# Patient Record
Sex: Female | Born: 1970 | Race: White | Hispanic: No | Marital: Married | Smoking: Never smoker
Health system: Southern US, Community
[De-identification: ages and names within clinical notes are randomized; demographics above are authoritative.]

## PROBLEM LIST (undated history)

## (undated) ENCOUNTER — Emergency Department (HOSPITAL_BASED_OUTPATIENT_CLINIC_OR_DEPARTMENT_OTHER): Admission: EM | Payer: No Typology Code available for payment source

## (undated) DIAGNOSIS — G8929 Other chronic pain: Secondary | ICD-10-CM

## (undated) DIAGNOSIS — Q909 Down syndrome, unspecified: Secondary | ICD-10-CM

## (undated) DIAGNOSIS — K219 Gastro-esophageal reflux disease without esophagitis: Secondary | ICD-10-CM

## (undated) DIAGNOSIS — F419 Anxiety disorder, unspecified: Secondary | ICD-10-CM

## (undated) DIAGNOSIS — K648 Other hemorrhoids: Secondary | ICD-10-CM

## (undated) DIAGNOSIS — R51 Headache: Secondary | ICD-10-CM

## (undated) DIAGNOSIS — G4730 Sleep apnea, unspecified: Secondary | ICD-10-CM

## (undated) DIAGNOSIS — R32 Unspecified urinary incontinence: Secondary | ICD-10-CM

## (undated) DIAGNOSIS — Z8249 Family history of ischemic heart disease and other diseases of the circulatory system: Secondary | ICD-10-CM

## (undated) DIAGNOSIS — M549 Dorsalgia, unspecified: Secondary | ICD-10-CM

## (undated) DIAGNOSIS — R112 Nausea with vomiting, unspecified: Secondary | ICD-10-CM

## (undated) DIAGNOSIS — R896 Abnormal cytological findings in specimens from other organs, systems and tissues: Secondary | ICD-10-CM

## (undated) DIAGNOSIS — Z9889 Other specified postprocedural states: Secondary | ICD-10-CM

## (undated) DIAGNOSIS — F40240 Claustrophobia: Secondary | ICD-10-CM

## (undated) DIAGNOSIS — K209 Esophagitis, unspecified without bleeding: Secondary | ICD-10-CM

## (undated) DIAGNOSIS — R519 Headache, unspecified: Secondary | ICD-10-CM

## (undated) DIAGNOSIS — Z8489 Family history of other specified conditions: Secondary | ICD-10-CM

## (undated) HISTORY — DX: Down syndrome, unspecified: Q90.9

## (undated) HISTORY — PX: TENDON REPAIR: SHX5111

## (undated) HISTORY — DX: Unspecified urinary incontinence: R32

## (undated) HISTORY — DX: Sleep apnea, unspecified: G47.30

## (undated) HISTORY — DX: Anxiety disorder, unspecified: F41.9

## (undated) HISTORY — DX: Abnormal cytological findings in specimens from other organs, systems and tissues: R89.6

## (undated) HISTORY — PX: GALLBLADDER SURGERY: SHX652

## (undated) HISTORY — DX: Other hemorrhoids: K64.8

---
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
## 2001-11-03 ENCOUNTER — Ambulatory Visit (HOSPITAL_COMMUNITY)
Admission: RE | Admit: 2001-11-03 | Discharge: 2001-11-03 | Payer: Self-pay | Admitting: Physical Medicine & Rehabilitation

## 2001-11-03 ENCOUNTER — Encounter: Payer: Self-pay | Admitting: Obstetrics and Gynecology

## 2002-01-24 ENCOUNTER — Encounter: Payer: Self-pay | Admitting: Gastroenterology

## 2002-01-24 ENCOUNTER — Encounter: Admission: RE | Admit: 2002-01-24 | Discharge: 2002-01-24 | Payer: Self-pay | Admitting: Gastroenterology

## 2002-04-11 ENCOUNTER — Ambulatory Visit (HOSPITAL_COMMUNITY): Admission: RE | Admit: 2002-04-11 | Discharge: 2002-04-11 | Payer: Self-pay | Admitting: Gastroenterology

## 2003-07-01 ENCOUNTER — Other Ambulatory Visit: Admission: RE | Admit: 2003-07-01 | Discharge: 2003-07-01 | Payer: Self-pay | Admitting: *Deleted

## 2004-07-07 ENCOUNTER — Other Ambulatory Visit: Admission: RE | Admit: 2004-07-07 | Discharge: 2004-07-07 | Payer: Self-pay | Admitting: *Deleted

## 2005-08-30 DIAGNOSIS — IMO0001 Reserved for inherently not codable concepts without codable children: Secondary | ICD-10-CM

## 2005-08-30 HISTORY — DX: Reserved for inherently not codable concepts without codable children: IMO0001

## 2005-10-06 ENCOUNTER — Other Ambulatory Visit: Admission: RE | Admit: 2005-10-06 | Discharge: 2005-10-06 | Payer: Self-pay | Admitting: *Deleted

## 2006-04-19 ENCOUNTER — Other Ambulatory Visit: Admission: RE | Admit: 2006-04-19 | Discharge: 2006-04-19 | Payer: Self-pay | Admitting: Gynecology

## 2006-11-08 ENCOUNTER — Inpatient Hospital Stay (HOSPITAL_COMMUNITY): Admission: RE | Admit: 2006-11-08 | Discharge: 2006-11-10 | Payer: Self-pay | Admitting: Gynecology

## 2006-12-06 ENCOUNTER — Other Ambulatory Visit: Admission: RE | Admit: 2006-12-06 | Discharge: 2006-12-06 | Payer: Self-pay | Admitting: Gynecology

## 2007-06-27 ENCOUNTER — Other Ambulatory Visit: Admission: RE | Admit: 2007-06-27 | Discharge: 2007-06-27 | Payer: Self-pay | Admitting: Gynecology

## 2008-11-22 DIAGNOSIS — J449 Chronic obstructive pulmonary disease, unspecified: Secondary | ICD-10-CM | POA: Insufficient documentation

## 2008-11-25 ENCOUNTER — Ambulatory Visit: Payer: Self-pay | Admitting: Pulmonary Disease

## 2008-11-25 DIAGNOSIS — R05 Cough: Secondary | ICD-10-CM

## 2008-11-25 DIAGNOSIS — R059 Cough, unspecified: Secondary | ICD-10-CM | POA: Insufficient documentation

## 2008-12-06 ENCOUNTER — Other Ambulatory Visit: Admission: RE | Admit: 2008-12-06 | Discharge: 2008-12-06 | Payer: Self-pay | Admitting: Gynecology

## 2008-12-06 ENCOUNTER — Encounter: Payer: Self-pay | Admitting: Gynecology

## 2008-12-06 ENCOUNTER — Ambulatory Visit: Payer: Self-pay | Admitting: Gynecology

## 2009-04-08 ENCOUNTER — Ambulatory Visit: Payer: Self-pay | Admitting: Gynecology

## 2009-04-18 ENCOUNTER — Ambulatory Visit: Payer: Self-pay | Admitting: Gynecology

## 2009-11-17 ENCOUNTER — Inpatient Hospital Stay (HOSPITAL_COMMUNITY)
Admission: AD | Admit: 2009-11-17 | Discharge: 2009-11-19 | Payer: Self-pay | Source: Ambulatory Visit | Admitting: Obstetrics and Gynecology

## 2009-11-17 ENCOUNTER — Encounter (INDEPENDENT_AMBULATORY_CARE_PROVIDER_SITE_OTHER): Payer: Self-pay | Admitting: Obstetrics and Gynecology

## 2010-02-23 ENCOUNTER — Ambulatory Visit: Payer: Self-pay | Admitting: Gynecology

## 2010-02-23 ENCOUNTER — Other Ambulatory Visit: Admission: RE | Admit: 2010-02-23 | Discharge: 2010-02-23 | Payer: Self-pay | Admitting: Gynecology

## 2010-07-08 ENCOUNTER — Ambulatory Visit: Payer: Self-pay | Admitting: Gynecology

## 2010-11-10 ENCOUNTER — Ambulatory Visit (INDEPENDENT_AMBULATORY_CARE_PROVIDER_SITE_OTHER): Payer: BC Managed Care – PPO | Admitting: Gynecology

## 2010-11-10 DIAGNOSIS — Z30431 Encounter for routine checking of intrauterine contraceptive device: Secondary | ICD-10-CM

## 2010-11-22 LAB — CBC
HCT: 32.8 % — ABNORMAL LOW (ref 36.0–46.0)
HCT: 40.5 % (ref 36.0–46.0)
Hemoglobin: 11.1 g/dL — ABNORMAL LOW (ref 12.0–15.0)
Hemoglobin: 13.4 g/dL (ref 12.0–15.0)
MCHC: 33.1 g/dL (ref 30.0–36.0)
MCHC: 33.9 g/dL (ref 30.0–36.0)
MCV: 85.6 fL (ref 78.0–100.0)
MCV: 85.9 fL (ref 78.0–100.0)
Platelets: 170 10*3/uL (ref 150–400)
Platelets: 176 10*3/uL (ref 150–400)
RBC: 3.83 MIL/uL — ABNORMAL LOW (ref 3.87–5.11)
RBC: 4.72 MIL/uL (ref 3.87–5.11)
RDW: 14.6 % (ref 11.5–15.5)
RDW: 14.7 % (ref 11.5–15.5)
WBC: 12.9 10*3/uL — ABNORMAL HIGH (ref 4.0–10.5)
WBC: 14.4 10*3/uL — ABNORMAL HIGH (ref 4.0–10.5)

## 2010-11-22 LAB — RPR: RPR Ser Ql: NONREACTIVE

## 2011-01-15 NOTE — H&P (Signed)
NAME:  Norma [name], Norma [name]         ACCOUNT NO.:  [id number]   MEDICAL RECORD NO.:  [id number]          PATIENT TYPE:  INP   LOCATION:                                FACILITY:  [facility]   PHYSICIAN:  Timothy P. Fontaine, M.D.DATE OF BIRTH:  [birth year]   DATE OF ADMISSION:  11/08/2006  DATE OF DISCHARGE:                              HISTORY & PHYSICAL   DATE OF ADMISSION:  She is being admitted to [facility] November 08, 2006, to labor and delivery.   CHIEF COMPLAINT:  1. Pregnancy at term.  2. Favorable cervix.   HISTORY OF PRESENT ILLNESS:  A 40 year old G1, P0 female at [redacted] weeks  gestation with favorable cervix admitted for induction.  Prenatal course  has been uncomplicated with the exception of an ASCUS Pap smear at new  OB with planned postpartum followup.  She is beta strep negative  carrier.  She had normal first trimester serum screening and nuchal  thickness.  For the remainder of her history, see her [facility].   PHYSICAL EXAMINATION:  HEENT:  Normal.  LUNGS:  Clear.  CARDIAC:  Regular rate without rubs, murmurs or gallops.  ABDOMEN:  Gravid, vertex fetus consistent with term.  Positive Doptones.  PELVIC:  Cervix 50%, 2 cm, -2 station to -1 station, vertex  presentation.   ASSESSMENT AND PLAN:  A 41 year old gravida 1, para 0 female, term  gestation, favorable cervix, who is admitted for induction.  Options for  continued expectant management and antepartum testing versus induction  were reviewed and the patient elects for induction.  The patient is beta  strep negative.      Timothy P. Fontaine, M.D.  Electronically Signed     TPF/MEDQ  D:  11/07/2006  T:  11/07/2006  Job:  [id number]

## 2011-01-15 NOTE — Op Note (Signed)
   NAME:  Norma [name], Norma [name]                            ACCOUNT NO.:  [id number]   MEDICAL RECORD NO.:  [id number]                   PATIENT TYPE:  AMB   LOCATION:  ENDO                                 FACILITY:  [facility]   PHYSICIAN:  Charna Elizabeth, M.D.                   DATE OF BIRTH:  1971-05-17   DATE OF PROCEDURE:  04/11/2002  DATE OF DISCHARGE:                                 OPERATIVE REPORT   PROCEDURE:  Flexible sigmoidoscopy up to the hepatic flexure.   ENDOSCOPIST:  Charna Elizabeth, M.D.   INSTRUMENT USED:  Olympus video colonoscope.   INDICATIONS:  Severe abdominal pain with occasional dark stools and  worsening constipation in a 40 year old white female.  Rule out colonic  polyps, masses, etc.  The patient has a long-standing history of IBS.   PREPROCEDURE PREPARATION:  Informed consent was procured from the patient.  The patient was fasted for eight hours prior to the procedure and prepped  with a bottle of Fleet's Phospho-Soda the night prior to the procedure.   PREPROCEDURE PHYSICAL:  VITAL SIGNS:  The patient had stable vital signs.  NECK:  Supple.  CHEST:  Clear to auscultation.  S1, S2 regular.  ABDOMEN:  Soft with normal bowel sounds.   DESCRIPTION OF PROCEDURE:  The patient was placed in the left lateral  decubitus position and sedated with 80 mg of Demerol and 8 mg of Versed  intravenously.  Once the patient was adequately sedate and maintained on low-  flow oxygen and continuous cardiac monitoring, the Olympus video colonoscope  was advanced from the rectum to the hepatic flexure and the distal right  colon without difficulty.  No masses, polyps, erosions, ulceration, or  diverticula were seen.   IMPRESSION:  Normal exam up to the hepatic flexure.   RECOMMENDATIONS:  1. A high-fiber diet has been recommended for the patient with liberal fluid     intake.  2. Outpatient follow-up in the next two weeks for further recommendations.                 Charna Elizabeth, M.D.    JM/MEDQ  D:  04/11/2002  T:  04/13/2002  Job:  [id number]   cc:   Lafonda Mosses B. Thomasena Edis, M.D.

## 2011-11-12 ENCOUNTER — Other Ambulatory Visit: Payer: Self-pay | Admitting: Gynecology

## 2011-11-12 NOTE — Telephone Encounter (Signed)
Left a message for the patient. We have not seen her since early 2012 and no AEX since summer of 2011.

## 2011-11-18 ENCOUNTER — Encounter: Payer: Self-pay | Admitting: *Deleted

## 2011-11-18 ENCOUNTER — Encounter: Payer: Self-pay | Admitting: Gynecology

## 2011-11-18 ENCOUNTER — Other Ambulatory Visit (HOSPITAL_COMMUNITY)
Admission: RE | Admit: 2011-11-18 | Discharge: 2011-11-18 | Disposition: A | Discharge status: Home / Self Care | Payer: BC Managed Care – PPO | Source: Ambulatory Visit | Attending: Gynecology | Admitting: Gynecology

## 2011-11-18 ENCOUNTER — Ambulatory Visit (INDEPENDENT_AMBULATORY_CARE_PROVIDER_SITE_OTHER): Payer: BC Managed Care – PPO | Admitting: Gynecology

## 2011-11-18 VITALS — BP 120/80 | Ht 68.0 in | Wt 243.0 lb

## 2011-11-18 DIAGNOSIS — IMO0001 Reserved for inherently not codable concepts without codable children: Secondary | ICD-10-CM | POA: Insufficient documentation

## 2011-11-18 DIAGNOSIS — Z01419 Encounter for gynecological examination (general) (routine) without abnormal findings: Secondary | ICD-10-CM | POA: Insufficient documentation

## 2011-11-18 DIAGNOSIS — Z30431 Encounter for routine checking of intrauterine contraceptive device: Secondary | ICD-10-CM

## 2011-11-18 MED ORDER — FLUOXETINE HCL 20 MG PO TABS: 20.0000 mg | ORAL_TABLET | Freq: Every day | ORAL | Status: DC

## 2011-11-18 NOTE — Progress Notes (Signed)
Norma [name] 1971-07-10 [id number]        41 y.o.  for annual exam.  Doing well Mirena IUD with scant menses.  Past medical history,surgical history, medications, allergies, family history and social history were all reviewed and documented in the EPIC chart. ROS:  Was performed and pertinent positives and negatives are included in the history.  Exam: Sherrilyn Rist chaperone present Filed Vitals:   11/18/11 1401  BP: 120/80   General appearance  Normal Skin grossly normal Head/Neck normal with no cervical or supraclavicular adenopathy thyroid normal Lungs  clear Cardiac RR, without RMG Abdominal  soft, nontender, without masses, organomegaly or hernia Breasts  examined lying and sitting without masses, retractions, discharge or axillary adenopathy. Pelvic  Ext/BUS/vagina  normal   Cervix  normal  Pap done, IUD string visualized with colposcope.  Uterus  anteverted, normal size, shape and contour, midline and mobile nontender   Adnexa  Without masses or tenderness    Anus and perineum  normal   Rectovaginal  normal sphincter tone without palpated masses or tenderness.    Assessment/Plan:  41 y.o. female for annual exam.    1. IUD management. Mirena IUD placed 06/2010. She's doing well a scant absent menses. Her IUD string was visualized at the external os with the colposcope. 2. Pap smear. Pap smear was done today she has not had one in 2 years. 3. Mammography. I reviewed screening mammogram recommendations P. 35 and 40. She has no strong family history prefers to wait closer to 40. SBE monthly reviewed. 4. Health maintenance. Patient is currently being evaluated for some arm weakness and restless legs by her primary. She reports having a number of blood tests done. I asked her make sure that there is a thyroid panel run and she agrees to check on this. She seeing a weight loss specialist next week will follow up with them. No blood work was done today as she reports having glucose lipid  profile and other bloods done through her primary. Assuming she continues well from a gynecologic standpoint then she will see me in a year, sooner as needed.    [name], 2:40 PM 11/18/2011

## 2011-11-18 NOTE — Patient Instructions (Signed)
Follow up in one year for your annual gynecologic exam. 

## 2012-02-04 ENCOUNTER — Telehealth: Payer: Self-pay | Admitting: *Deleted

## 2012-02-04 MED ORDER — ALPRAZOLAM 0.5 MG PO TABS: 0.5000 mg | ORAL_TABLET | Freq: Every evening | ORAL | Status: AC | PRN

## 2012-02-04 NOTE — Telephone Encounter (Signed)
Xanax 0.5 mg #30 one by mouth every 6 hours when necessary anxiety 2 refill okay.

## 2012-02-04 NOTE — Telephone Encounter (Signed)
Pt called requesting new rx for xanax 0.5 mg tablets, she forgot to ask for refill at annual in march. Please advise

## 2012-02-04 NOTE — Telephone Encounter (Signed)
Pt informed, rx called in 

## 2012-11-21 ENCOUNTER — Other Ambulatory Visit: Payer: Self-pay | Admitting: Gynecology

## 2012-11-21 NOTE — Telephone Encounter (Signed)
I left message and asked Cecil Cranker. To try to call patient to set up CE.

## 2012-12-07 ENCOUNTER — Encounter: Payer: Self-pay | Admitting: Gynecology

## 2012-12-07 ENCOUNTER — Ambulatory Visit (INDEPENDENT_AMBULATORY_CARE_PROVIDER_SITE_OTHER): Payer: BC Managed Care – PPO | Admitting: Gynecology

## 2012-12-07 VITALS — BP 120/80 | Ht 68.0 in | Wt 244.0 lb

## 2012-12-07 DIAGNOSIS — N949 Unspecified condition associated with female genital organs and menstrual cycle: Secondary | ICD-10-CM

## 2012-12-07 DIAGNOSIS — R102 Pelvic and perineal pain: Secondary | ICD-10-CM

## 2012-12-07 DIAGNOSIS — Z01419 Encounter for gynecological examination (general) (routine) without abnormal findings: Secondary | ICD-10-CM

## 2012-12-07 NOTE — Progress Notes (Signed)
Norma [name] [birth year] [id number]        42 y.o.  G2P2 for annual exam.  Doing well does note some pelvic cramping as noted below.  Past medical history,surgical history, medications, allergies, family history and social history were all reviewed and documented in the EPIC chart. ROS:  Was performed and pertinent positives and negatives are included in the history.  Exam: [name] assistant Filed Vitals:   12/07/12 1053  BP: 120/80  Height: 5\' 8"  (1.727 m)  Weight: 244 lb (110.678 kg)   General appearance  Normal Skin grossly normal Head/Neck normal with no cervical or supraclavicular adenopathy thyroid normal Lungs  clear Cardiac RR, without RMG Abdominal  soft, nontender, without masses, organomegaly or hernia Breasts  examined lying and sitting without masses, retractions, discharge or axillary adenopathy. Pelvic  Ext/BUS/vagina  normal   Cervix  normal IUD string palpated at the external os  Uterus  anteverted, normal size, shape and contour, midline and mobile nontender   Adnexa  Without masses or tenderness    Anus and perineum  normal   Rectovaginal  normal sphincter tone without palpated masses or tenderness.    Assessment/Plan:  42 y.o. G2P2 female for annual exam.   1. Mirena IUD 06/2010. She is having pelvic cramping like menstrual cramping.  No menses with occasional spotting. Will check baseline ultrasound for placement and other pelvic pathology. Patient will schedule follow up for this. Assuming negative then we'll follow as is not overly bothersome to her. 2. Pap smear 2013. No Pap smear done today. History of ascus with negative HPV 2007. Other Pap smears normal. Plan screening every 3 years. 3. Breast health. SBE monthly review. We'll plan baseline mammogram at age 42. 4. Health maintenance. Patient has appointment to see a new primary physician and will follow up with them for routine exam and lab work. Follow up for ultrasound otherwise one year.      [name], 11:16 AM 12/07/2012

## 2012-12-07 NOTE — Patient Instructions (Signed)
Follow up for ultrasound as scheduled 

## 2012-12-13 ENCOUNTER — Ambulatory Visit: Payer: BC Managed Care – PPO | Admitting: Gynecology

## 2012-12-13 ENCOUNTER — Other Ambulatory Visit: Payer: BC Managed Care – PPO

## 2012-12-29 ENCOUNTER — Encounter: Payer: Self-pay | Admitting: Gynecology

## 2012-12-29 ENCOUNTER — Ambulatory Visit (INDEPENDENT_AMBULATORY_CARE_PROVIDER_SITE_OTHER): Payer: BC Managed Care – PPO

## 2012-12-29 ENCOUNTER — Ambulatory Visit (INDEPENDENT_AMBULATORY_CARE_PROVIDER_SITE_OTHER): Payer: BC Managed Care – PPO | Admitting: Gynecology

## 2012-12-29 DIAGNOSIS — R102 Pelvic and perineal pain: Secondary | ICD-10-CM

## 2012-12-29 DIAGNOSIS — N949 Unspecified condition associated with female genital organs and menstrual cycle: Secondary | ICD-10-CM

## 2012-12-29 NOTE — Patient Instructions (Signed)
Followup for annual exam in one year, sooner as needed. 

## 2012-12-29 NOTE — Progress Notes (Signed)
Patient presents for ultrasound due to complains of pelvic cramping with Mirena IUD. Has occasional menses and spotting.  Ultrasound shows normal uterine size and echotexture. Endometrial echo 3.9 mm. Right and left ovaries visualized and normal. IUD is visualized in the endometrial cavity in the appropriate area. Cul-de-sac negative  Assessment and plan: Pelvic cramping with IUD. Normal exam and ultrasound. Options of management to include removal versus observation discussed. Patient feels that the discomfort is not significant enough to remove the IUD and prefers to monitor. She'll followup in one year for her annual exam, sooner as needed.

## 2012-12-31 LAB — HM PAP SMEAR: HM PAP: NORMAL

## 2013-01-01 ENCOUNTER — Other Ambulatory Visit: Payer: Self-pay | Admitting: Gynecology

## 2013-01-03 ENCOUNTER — Encounter: Payer: Self-pay | Admitting: Family Medicine

## 2013-01-03 ENCOUNTER — Ambulatory Visit (INDEPENDENT_AMBULATORY_CARE_PROVIDER_SITE_OTHER): Payer: BC Managed Care – PPO | Admitting: Family Medicine

## 2013-01-03 VITALS — BP 118/80 | HR 93 | Temp 98.2°F | Ht 67.5 in | Wt 250.4 lb

## 2013-01-03 DIAGNOSIS — F418 Other specified anxiety disorders: Secondary | ICD-10-CM | POA: Insufficient documentation

## 2013-01-03 DIAGNOSIS — M546 Pain in thoracic spine: Secondary | ICD-10-CM

## 2013-01-03 DIAGNOSIS — Z1331 Encounter for screening for depression: Secondary | ICD-10-CM

## 2013-01-03 DIAGNOSIS — F341 Dysthymic disorder: Secondary | ICD-10-CM

## 2013-01-03 MED ORDER — MELOXICAM 15 MG PO TABS: 15.0000 mg | ORAL_TABLET | Freq: Every day | ORAL | Status: DC

## 2013-01-03 MED ORDER — CYCLOBENZAPRINE HCL 10 MG PO TABS: 10.0000 mg | ORAL_TABLET | Freq: Three times a day (TID) | ORAL | Status: DC | PRN

## 2013-01-03 MED ORDER — ESCITALOPRAM OXALATE 10 MG PO TABS: 10.0000 mg | ORAL_TABLET | Freq: Every day | ORAL | Status: DC

## 2013-01-03 NOTE — Patient Instructions (Addendum)
Follow up in 1 month to recheck mood and back Stop the Prozac START the Lexapro Start the Mobic daily for inflammation Use the flexeril at night for spasm- start w/ 1/2 tab b/c this will make you drowsy HEAT! [facility] can help! Call with any questions or concerns Welcome!  We're glad to have you!

## 2013-01-03 NOTE — Assessment & Plan Note (Signed)
New.  Likely muscle strain and spasm due to carrying daughter regularly.  Start daily NSAIDs, flexeril and heat prn.  Reviewed supportive care and red flags that should prompt return.  Pt expressed understanding and is in agreement w/ plan.

## 2013-01-03 NOTE — Assessment & Plan Note (Signed)
New to provider, ongoing for pt.  Extremely stressed w/ demands of special needs daughter.  Prozac has made pt 'flat' but she remains anxious.  sxs not well controlled.  Will stop Prozac and switch to Lexapro.  Encouraged counseling.  Total time spent w/ pt, 30+ min, >50% spent counseling

## 2013-01-03 NOTE — Progress Notes (Signed)
  Subjective:    Patient ID: Norma [name], female    DOB: 08-Aug-1971, 42 y.o.   MRN: [id number]  HPI New to establish.  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  Has 51 yr old and 40 yr old w/ Jeral Pinch and multiple medical issues.  Pt is feeling that the Prozac is making her 'flat'.  Pt reports feeling very anxious.  Back pain- pt is having both upper back pain due to trap spasm and 'this is where i keep my stress'.  Having mid back pain due to lifting special needs daughter (3 yrs old and not walking).  Taking large quantities of ibuprofen- starting to be ineffective.  Pt reports ~40 lb weight gain recently.   Review of Systems For ROS see HPI     Objective:   Physical Exam  Vitals reviewed. Constitutional: She is oriented to person, place, and time. She appears well-developed and well-nourished. No distress.  Musculoskeletal: She exhibits tenderness (TTP over traps bilaterally and across lats bilateraly, L>R). She exhibits no edema.  Good forward flexion and extension of back (-) SLR bilaterally No bony TTP  Neurological: She is alert and oriented to person, place, and time. She has normal reflexes. No cranial nerve deficit. Coordination normal.  Skin: Skin is warm and dry.  Psychiatric: She has a normal mood and affect. Her behavior is normal. Judgment and thought content normal.          Assessment & Plan:

## 2013-02-01 ENCOUNTER — Other Ambulatory Visit: Payer: Self-pay | Admitting: Family Medicine

## 2013-02-02 NOTE — Telephone Encounter (Signed)
Med filled.  

## 2013-02-12 ENCOUNTER — Ambulatory Visit: Payer: BC Managed Care – PPO | Admitting: Family Medicine

## 2013-02-14 ENCOUNTER — Encounter: Payer: Self-pay | Admitting: Family Medicine

## 2013-02-14 ENCOUNTER — Ambulatory Visit (INDEPENDENT_AMBULATORY_CARE_PROVIDER_SITE_OTHER): Payer: BC Managed Care – PPO | Admitting: Family Medicine

## 2013-02-14 VITALS — BP 110/80 | HR 96 | Temp 98.2°F | Ht 67.5 in | Wt 255.6 lb

## 2013-02-14 DIAGNOSIS — F418 Other specified anxiety disorders: Secondary | ICD-10-CM

## 2013-02-14 DIAGNOSIS — F341 Dysthymic disorder: Secondary | ICD-10-CM

## 2013-02-14 MED ORDER — CYCLOBENZAPRINE HCL 10 MG PO TABS: 10.0000 mg | ORAL_TABLET | Freq: Three times a day (TID) | ORAL | Status: DC | PRN

## 2013-02-14 MED ORDER — ESCITALOPRAM OXALATE 20 MG PO TABS: 20.0000 mg | ORAL_TABLET | Freq: Every day | ORAL | Status: DC

## 2013-02-14 NOTE — Assessment & Plan Note (Signed)
Improved since starting Lexapro.  Titrate to 20mg  daily.  Will continue to follow.

## 2013-02-14 NOTE — Progress Notes (Signed)
  Subjective:    Patient ID: Norma [name], female    DOB: [birth year], 42 y.o.   MRN: [id number]  HPI Depression/Anxiety- pt was switched from Prozac to Lexapro.  sxs have improved since making switch but have not completely resolved.  'it has definitely taken the edge off'.  Sleeping well, attempting to get regular exercise.   Review of Systems For ROS see HPI     Objective:   Physical Exam  Vitals reviewed. Constitutional: She is oriented to person, place, and time. She appears well-developed and well-nourished. No distress.  Neurological: She is alert and oriented to person, place, and time.  Skin: Skin is warm and dry.  Psychiatric: She has a normal mood and affect. Her behavior is normal. Thought content normal.          Assessment & Plan:

## 2013-02-14 NOTE — Patient Instructions (Addendum)
Schedule your complete physical in 6 months Increase the Lexapro to 20mg - 2 of what you have and 1 of the new script Call with any questions or concerns Have a great summer!!!

## 2013-11-28 ENCOUNTER — Ambulatory Visit: Payer: BC Managed Care – PPO | Admitting: Family Medicine

## 2013-12-03 ENCOUNTER — Encounter: Payer: Self-pay | Admitting: Family Medicine

## 2013-12-03 ENCOUNTER — Encounter: Payer: Self-pay | Admitting: General Practice

## 2013-12-03 ENCOUNTER — Ambulatory Visit (INDEPENDENT_AMBULATORY_CARE_PROVIDER_SITE_OTHER): Payer: BC Managed Care – PPO | Admitting: Family Medicine

## 2013-12-03 VITALS — BP 112/78 | HR 89 | Temp 98.2°F | Resp 16 | Wt 253.2 lb

## 2013-12-03 DIAGNOSIS — R0683 Snoring: Secondary | ICD-10-CM

## 2013-12-03 DIAGNOSIS — F418 Other specified anxiety disorders: Secondary | ICD-10-CM

## 2013-12-03 DIAGNOSIS — Z131 Encounter for screening for diabetes mellitus: Secondary | ICD-10-CM

## 2013-12-03 DIAGNOSIS — R0609 Other forms of dyspnea: Secondary | ICD-10-CM

## 2013-12-03 DIAGNOSIS — Z136 Encounter for screening for cardiovascular disorders: Secondary | ICD-10-CM

## 2013-12-03 DIAGNOSIS — F341 Dysthymic disorder: Secondary | ICD-10-CM

## 2013-12-03 DIAGNOSIS — G4733 Obstructive sleep apnea (adult) (pediatric): Secondary | ICD-10-CM | POA: Insufficient documentation

## 2013-12-03 DIAGNOSIS — E669 Obesity, unspecified: Secondary | ICD-10-CM | POA: Insufficient documentation

## 2013-12-03 DIAGNOSIS — R0989 Other specified symptoms and signs involving the circulatory and respiratory systems: Secondary | ICD-10-CM

## 2013-12-03 LAB — BASIC METABOLIC PANEL
BUN: 16 mg/dL (ref 6–23)
CO2: 28 mEq/L (ref 19–32)
CREATININE: 0.7 mg/dL (ref 0.4–1.2)
Calcium: 9.2 mg/dL (ref 8.4–10.5)
Chloride: 101 mEq/L (ref 96–112)
GFR: 93.54 mL/min (ref >60.00)
GLUCOSE: 118 mg/dL — AB (ref 70–99)
POTASSIUM: 3.8 meq/L (ref 3.5–5.1)
Sodium: 139 mEq/L (ref 135–145)

## 2013-12-03 LAB — HEMOGLOBIN A1C: HEMOGLOBIN A1C: 6 % (ref 4.6–6.5)

## 2013-12-03 LAB — HEPATIC FUNCTION PANEL
ALBUMIN: 4.2 g/dL (ref 3.5–5.2)
ALK PHOS: 94 U/L (ref 39–117)
ALT: 20 U/L (ref 0–35)
AST: 18 U/L (ref 0–37)
Bilirubin, Direct: 0 mg/dL (ref 0.0–0.3)
TOTAL PROTEIN: 7.6 g/dL (ref 6.0–8.3)
Total Bilirubin: 0.5 mg/dL (ref 0.3–1.2)

## 2013-12-03 LAB — LIPID PANEL
CHOLESTEROL: 180 mg/dL (ref 0–200)
HDL: 46.8 mg/dL (ref >39.00)
LDL CALC: 111 mg/dL — AB (ref 0–99)
TRIGLYCERIDES: 109 mg/dL (ref 0.0–149.0)
Total CHOL/HDL Ratio: 4
VLDL: 21.8 mg/dL (ref 0.0–40.0)

## 2013-12-03 LAB — TSH: TSH: 0.77 u[IU]/mL (ref 0.35–5.50)

## 2013-12-03 MED ORDER — VENLAFAXINE HCL ER 37.5 MG PO CP24: 37.5000 mg | ORAL_CAPSULE | Freq: Every day | ORAL | Status: DC

## 2013-12-03 NOTE — Assessment & Plan Note (Signed)
New.  Severe.  Suspect possible sleep apnea.  Refer to pulmonary for complete evaluation and likely sleep study.

## 2013-12-03 NOTE — Progress Notes (Signed)
Pre visit review using our clinic review tool, if applicable. No additional management support is needed unless otherwise documented below in the visit note. 

## 2013-12-03 NOTE — Progress Notes (Signed)
   Subjective:    Patient ID: Norma [name], female    DOB: 10/28/1970, 43 y.o.   MRN: [id number]  HPI Depression/anxiety- pt had increased Lexapro to 40mg  (on her own) b/c she didn't feel that this was working.  Stopped suddenly.  Has started exercising hoping that this would help but she is still snapping at kids.    Snoring- 'it's out of control'.  Husband is sleeping in other room.  Has tried Breathe Right strips, adjustable bed w/o relief.  Seasonal allergies- chronic ear pain.  Taking Zyrtec daily, only using Flonase 'as needed'.  Obesity- has hired a Psychologist, educationaltrainer, exercising regularly, attempting to eat well and not losing weight.  Very frustrated.    Review of Systems For ROS see HPI     Objective:   Physical Exam  Vitals reviewed. Constitutional: She appears well-developed and well-nourished. No distress.  HENT:  Head: Normocephalic and atraumatic.  Right Ear: Tympanic membrane normal.  Left Ear: Tympanic membrane normal.  Nose: Mucosal edema and rhinorrhea present. Right sinus exhibits no maxillary sinus tenderness and no frontal sinus tenderness. Left sinus exhibits no maxillary sinus tenderness and no frontal sinus tenderness.  Mouth/Throat: Mucous membranes are normal. Posterior oropharyngeal erythema (w/ PND) present.  Eyes: Conjunctivae and EOM are normal. Pupils are equal, round, and reactive to light.  Neck: Normal range of motion. Neck supple.  Cardiovascular: Normal rate, regular rhythm and normal heart sounds.   Pulmonary/Chest: Effort normal and breath sounds normal. No respiratory distress. She has no wheezes. She has no rales.  Lymphadenopathy:    She has no cervical adenopathy.          Assessment & Plan:

## 2013-12-03 NOTE — Assessment & Plan Note (Signed)
Deteriorated.  Pt stopped Lexapro due to ineffectiveness.  Start Effexor and monitor for improvement.  Cautioned pt that she will likely need to increase dose for it to be effective.  Will follow closely.

## 2013-12-03 NOTE — Patient Instructions (Signed)
Follow up in 4-6 weeks to recheck mood Start the Effexor daily for mood- we will likely need to increase this at next visit to get to standard dose We'll call you with your pulmonary appt for the snoring We'll notify you of your lab results and make any changes if needed Keep up the good work on healthy diet and regular exercise Call with any questions or concerns Hang in there!!

## 2013-12-03 NOTE — Assessment & Plan Note (Signed)
New to provider, ongoing for pt.  Unable to lose weight despite hiring personal trainer and attempting to eat better.  Suspect that if pt has sleep apnea this is contributing to inability to lose weight.  Refer to pulm for sleep study.  Will follow.

## 2013-12-25 ENCOUNTER — Ambulatory Visit (INDEPENDENT_AMBULATORY_CARE_PROVIDER_SITE_OTHER): Payer: BC Managed Care – PPO | Admitting: Pulmonary Disease

## 2013-12-25 ENCOUNTER — Encounter: Payer: Self-pay | Admitting: Pulmonary Disease

## 2013-12-25 VITALS — BP 118/72 | HR 87 | Temp 98.1°F | Ht 67.0 in | Wt 252.0 lb

## 2013-12-25 DIAGNOSIS — R0609 Other forms of dyspnea: Secondary | ICD-10-CM

## 2013-12-25 DIAGNOSIS — R0989 Other specified symptoms and signs involving the circulatory and respiratory systems: Secondary | ICD-10-CM

## 2013-12-25 DIAGNOSIS — R0683 Snoring: Secondary | ICD-10-CM

## 2013-12-25 NOTE — Assessment & Plan Note (Signed)
The patient's history is very suggestive of clinically significant sleep disordered breathing. It had a long discussion with her about sleep apnea, including its impact to her quality of life and cardiovascular health. She will need a sleep study for diagnosis, and I think she is an excellent candidate for home sleep testing. The patient is agreeable to this approach.

## 2013-12-25 NOTE — Patient Instructions (Signed)
Will schedule for home sleep testing, and will arrange for followup once the results are available. Work on weight reduction.  

## 2013-12-25 NOTE — Progress Notes (Signed)
Subjective:    Patient ID: Norma [name], female    DOB: 08/19/1971, 43 y.o.   MRN: [id number]  HPI The patient is a 43 year old female who I've been asked to see for possible obstructive sleep apnea. She has been noted to have loud snoring, as well as an abnormal breathing pattern during sleep. Her husband has had to move to a different room in order to sleep.  She has frequent awakenings at night, and is not rested in the mornings upon arising. The patient notes definite sleep pressure during the day, where she may doze in the car pool line, or follow sleep watching television in the afternoons or evening. She does not get sleepy driving. Patient states that her weight is up 25-30 pounds over the last 2 years, and her Epworth score is abnormal at 12.   Sleep Questionnaire What time do you typically go to bed?( Between what hours) 11 pm 11 pm at 1132 on 12/25/13 by Darrell JewelJennifer R Castillo, CMA How long does it take you to fall asleep? 20 minutes 20 minutes at 1132 on 12/25/13 by Darrell JewelJennifer R Castillo, CMA How many times during the night do you wake up? 4 4 at 1132 on 12/25/13 by Darrell JewelJennifer R Castillo, CMA What time do you get out of bed to start your day? 0630 0630 at 1132 on 12/25/13 by Darrell JewelJennifer R Castillo, CMA Do you drive or operate heavy machinery in your occupation? No No at 1132 on 12/25/13 by Darrell JewelJennifer R Castillo, CMA How much has your weight changed (up or down) over the past two years? (In pounds) 30 lb (13.608 kg) 30 lb (13.608 kg) at 1132 on 12/25/13 by Darrell JewelJennifer R Castillo, CMA Have you ever had a sleep study before? No No at 1132 on 12/25/13 by Darrell JewelJennifer R Castillo, CMA Do you currently use CPAP? No No at 1132 on 12/25/13 by Darrell JewelJennifer R Castillo, CMA Do you wear oxygen at any time? No    Review of Systems  Constitutional: Negative for fever and unexpected weight change.  HENT: Positive for congestion and sneezing. Negative for dental problem, ear pain, nosebleeds,  postnasal drip, rhinorrhea, sinus pressure, sore throat and trouble swallowing.   Eyes: Negative for redness and itching.  Respiratory: Negative for cough, chest tightness, shortness of breath and wheezing.   Cardiovascular: Negative for palpitations and leg swelling.  Gastrointestinal: Negative for nausea and vomiting.  Genitourinary: Negative for dysuria.  Musculoskeletal: Negative for joint swelling.  Skin: Negative for rash.  Neurological: Positive for headaches.  Hematological: Does not bruise/bleed easily.  Psychiatric/Behavioral: Negative for dysphoric mood. The patient is nervous/anxious.        Objective:   Physical Exam Constitutional:  Overweight female, no acute distress  HENT:  Nares patent without discharge  Oropharynx without exudate, palate and uvula are elongated, small space posteriorly  Eyes:  Perrla, eomi, no scleral icterus  Neck:  No JVD, no TMG  Cardiovascular:  Normal rate, regular rhythm, no rubs or gallops.  No murmurs        Intact distal pulses  Pulmonary :  Normal breath sounds, no stridor or respiratory distress   No rales, rhonchi, or wheezing  Abdominal:  Soft, nondistended, bowel sounds present.  No tenderness noted.   Musculoskeletal:  No lower extremity edema noted.  Lymph Nodes:  No cervical lymphadenopathy noted  Skin:  No cyanosis noted  Neurologic:  Alert, appropriate, moves all 4 extremities without obvious deficit.         Assessment &  Plan:

## 2013-12-31 ENCOUNTER — Encounter: Payer: Self-pay | Admitting: Family Medicine

## 2013-12-31 ENCOUNTER — Ambulatory Visit (INDEPENDENT_AMBULATORY_CARE_PROVIDER_SITE_OTHER): Payer: BC Managed Care – PPO | Admitting: Family Medicine

## 2013-12-31 VITALS — BP 116/80 | HR 90 | Temp 98.2°F | Resp 16 | Wt 251.1 lb

## 2013-12-31 DIAGNOSIS — G2581 Restless legs syndrome: Secondary | ICD-10-CM

## 2013-12-31 DIAGNOSIS — F341 Dysthymic disorder: Secondary | ICD-10-CM

## 2013-12-31 DIAGNOSIS — F418 Other specified anxiety disorders: Secondary | ICD-10-CM

## 2013-12-31 MED ORDER — VENLAFAXINE HCL ER 75 MG PO CP24: 75.0000 mg | ORAL_CAPSULE | Freq: Every day | ORAL | Status: DC

## 2013-12-31 MED ORDER — CLONAZEPAM 0.5 MG PO TABS: 0.5000 mg | ORAL_TABLET | Freq: Every day | ORAL | Status: DC

## 2013-12-31 NOTE — Progress Notes (Signed)
   Subjective:    Patient ID: Norma [name], female    DOB: 05/17/1971, 43 y.o.   MRN: [id number]  HPI Depression w/ anxiety- started Effexor at last visit.  Last week was feeling 'down' but pt feels this may have been hormonal.  Still having anxiety.  Pt continues to exercise  RLS- pt reports 'they drive me crazy'.  'really bad at night.  i have to get up and pace'.  sxs are worse since pt has started working out.   Review of Systems For ROS see HPI     Objective:   Physical Exam  Vitals reviewed. Constitutional: She is oriented to person, place, and time. She appears well-developed and well-nourished. No distress.  Cardiovascular: Normal rate, regular rhythm, normal heart sounds and intact distal pulses.   Pulmonary/Chest: Effort normal and breath sounds normal. No respiratory distress. She has no wheezes. She has no rales.  Neurological: She is alert and oriented to person, place, and time.  Skin: Skin is warm and dry.  Psychiatric: She has a normal mood and affect. Her behavior is normal. Thought content normal.          Assessment & Plan:

## 2013-12-31 NOTE — Progress Notes (Signed)
Pre visit review using our clinic review tool, if applicable. No additional management support is needed unless otherwise documented below in the visit note. 

## 2013-12-31 NOTE — Assessment & Plan Note (Signed)
Improved since switching to Effexor.  Will increase dose to 75mg  for better efficacy.  Will continue to monitor.

## 2013-12-31 NOTE — Assessment & Plan Note (Signed)
New.  Pt reports sxs have recently worsened.  Start low dose klonopin for symptom relief and to manage anxiety at the same time.  Will follow.

## 2013-12-31 NOTE — Patient Instructions (Signed)
Follow up as needed Increase the Effexor to 75mg - 2 of what you have, 1 of the new prescription Start the Klonopin nightly for both anxiety and RLS Call with any questions or concerns Have an AMAZING trip!

## 2014-03-12 ENCOUNTER — Telehealth: Payer: Self-pay | Admitting: Pulmonary Disease

## 2014-03-12 NOTE — Telephone Encounter (Signed)
Spoke with [facility].  She is checking with Bjorn Loserhonda regarding results.  Will hold in triage.

## 2014-03-12 NOTE — Telephone Encounter (Signed)
Pt had HST on 01/24/14, then left on vacation [place] seas. When Dr. Shelle Ironlance read the study, he stated that various sensors and leads malfunctioned on/off all night. I contacted patient on 02/05/14 and lmoam that HST needed to be repeated when she returned from vacation. Patient stated that she just got back yesterday and did not listen to any message while she was away.  Pt will repeat HST on Thurs 03/14/14. Pt is aware that she will need to sleep by herself on the night of the HST. Rhonda J Cobb Nothing else needed at this time. Rhonda J Cobb

## 2014-03-14 DIAGNOSIS — G4710 Hypersomnia, unspecified: Secondary | ICD-10-CM

## 2014-03-14 DIAGNOSIS — G4730 Sleep apnea, unspecified: Secondary | ICD-10-CM

## 2014-03-18 ENCOUNTER — Telehealth: Payer: Self-pay | Admitting: Pulmonary Disease

## 2014-03-18 DIAGNOSIS — G4730 Sleep apnea, unspecified: Secondary | ICD-10-CM

## 2014-03-18 DIAGNOSIS — G4710 Hypersomnia, unspecified: Secondary | ICD-10-CM

## 2014-03-18 NOTE — Telephone Encounter (Signed)
appt set for 03-20-14. Carron CurieJennifer Sharley Keeler, CMA

## 2014-03-18 NOTE — Telephone Encounter (Signed)
Please let pt know that her sleep study is abnormal, and she needs ov to review results.

## 2014-03-20 ENCOUNTER — Ambulatory Visit: Payer: BC Managed Care – PPO | Admitting: Pulmonary Disease

## 2014-04-09 ENCOUNTER — Ambulatory Visit (INDEPENDENT_AMBULATORY_CARE_PROVIDER_SITE_OTHER): Payer: BC Managed Care – PPO | Admitting: Pulmonary Disease

## 2014-04-09 ENCOUNTER — Encounter: Payer: Self-pay | Admitting: Pulmonary Disease

## 2014-04-09 VITALS — BP 130/80 | HR 95 | Temp 98.2°F | Ht 67.0 in | Wt 252.0 lb

## 2014-04-09 DIAGNOSIS — G4733 Obstructive sleep apnea (adult) (pediatric): Secondary | ICD-10-CM

## 2014-04-09 NOTE — Progress Notes (Signed)
   Subjective:    Patient ID: Norma [name], female    DOB: 02/03/1971, 43 y.o.   MRN: [id number]  HPI The patient comes in today for followup of her recent sleep study. She was found to have moderate OSA, with an AHI of 26 events per hour. I have reviewed the study with her in detail, and answered all of her questions.   Review of Systems  Constitutional: Negative for fever and unexpected weight change.  HENT: Negative for congestion, dental problem, ear pain, nosebleeds, postnasal drip, rhinorrhea, sinus pressure, sneezing, sore throat and trouble swallowing.   Eyes: Negative for redness and itching.  Respiratory: Negative for cough, chest tightness, shortness of breath and wheezing.   Cardiovascular: Negative for palpitations and leg swelling.  Gastrointestinal: Negative for nausea and vomiting.  Genitourinary: Negative for dysuria.  Musculoskeletal: Negative for joint swelling.  Skin: Negative for rash.  Neurological: Negative for headaches.  Hematological: Does not bruise/bleed easily.  Psychiatric/Behavioral: Negative for dysphoric mood. The patient is not nervous/anxious.        Objective:   Physical Exam Overweight female in no acute distress Nose without purulence or discharge noted Neck without lymphadenopathy or thyromegaly Lower extremities with mild edema, no cyanosis Alert and oriented, moves all 4 extremities       Assessment & Plan:

## 2014-04-09 NOTE — Assessment & Plan Note (Signed)
The patient has moderate obstructive sleep apnea by her recent home sleep study, and I have reviewed the various treatment options with her. This can include a trial of weight loss alone, upper airway surgery, dental appliance, and also CPAP. She would like to treat this more aggressively while she is working on weight loss, and would like to try CPAP and also consider possible surgical evaluation. I will arrange for ENT referral.

## 2014-04-09 NOTE — Patient Instructions (Signed)
Will start on cpap at a moderate pressure level.  Please call if having tolerance issues. Work on [employer] loss Will refer to ENT for consideration of upper airway surgery. followup with me again in 8 weeks.

## 2014-04-10 ENCOUNTER — Encounter: Payer: Self-pay | Admitting: Pulmonary Disease

## 2014-06-04 ENCOUNTER — Ambulatory Visit (INDEPENDENT_AMBULATORY_CARE_PROVIDER_SITE_OTHER): Payer: BC Managed Care – PPO | Admitting: Pulmonary Disease

## 2014-06-04 ENCOUNTER — Encounter: Payer: Self-pay | Admitting: Pulmonary Disease

## 2014-06-04 VITALS — BP 122/68 | HR 108 | Temp 97.6°F | Ht 67.0 in | Wt 264.8 lb

## 2014-06-04 DIAGNOSIS — G4733 Obstructive sleep apnea (adult) (pediatric): Secondary | ICD-10-CM

## 2014-06-04 NOTE — Assessment & Plan Note (Signed)
The patient is doing very well on CPAP, with improved sleep and daytime alertness. She is having no issues with mask fit or pressure. I've encouraged her to work aggressively on weight loss, and will see her back in 6 months.

## 2014-06-04 NOTE — Patient Instructions (Signed)
Will change your cpap pressure to auto 5-15. Continue on cpap and keep up with mask changes and supplies. Work on weight loss followup with me again in 6mos.

## 2014-06-04 NOTE — Progress Notes (Signed)
   Subjective:    Patient ID: Norma [name], female    DOB: 01/17/1971, 43 y.o.   MRN: [id number]  HPI Patient comes in today for followup of her obstructive sleep apnea. She is wearing CPAP compliantly, and is having no issues with her mask fit or pressure. Her download shows excellent compliance, and good control of her OSA. She feels that she is sleeping much better, and has increased daytime alertness. She initially had some issues with humidity, but this has been adjusted and she is doing better.   Review of Systems  Constitutional: Negative for fever and unexpected weight change.  HENT: Negative for congestion, dental problem, ear pain, nosebleeds, postnasal drip, rhinorrhea, sinus pressure, sneezing, sore throat and trouble swallowing.   Eyes: Negative for redness and itching.  Respiratory: Negative for cough, chest tightness, shortness of breath and wheezing.   Cardiovascular: Negative for palpitations and leg swelling.  Gastrointestinal: Negative for nausea and vomiting.  Genitourinary: Negative for dysuria.  Musculoskeletal: Negative for joint swelling.  Skin: Negative for rash.  Neurological: Negative for headaches.  Hematological: Does not bruise/bleed easily.  Psychiatric/Behavioral: Negative for dysphoric mood. The patient is not nervous/anxious.        Objective:   Physical Exam Obese female in no acute distress Nose without purulence or discharge noted Neck without lymphadenopathy or thyromegaly No skin breakdown or pressure necrosis from the CPAP mask Lower extremities without edema, no cyanosis Alert and oriented, moves all 4 extremities.        Assessment & Plan:

## 2014-06-24 ENCOUNTER — Other Ambulatory Visit: Payer: Self-pay | Admitting: General Practice

## 2014-06-24 ENCOUNTER — Encounter: Payer: Self-pay | Admitting: Family Medicine

## 2014-06-24 ENCOUNTER — Ambulatory Visit (INDEPENDENT_AMBULATORY_CARE_PROVIDER_SITE_OTHER): Payer: BC Managed Care – PPO | Admitting: Family Medicine

## 2014-06-24 VITALS — BP 120/82 | HR 90 | Temp 98.1°F | Resp 16 | Wt 259.1 lb

## 2014-06-24 DIAGNOSIS — F418 Other specified anxiety disorders: Secondary | ICD-10-CM

## 2014-06-24 DIAGNOSIS — Z23 Encounter for immunization: Secondary | ICD-10-CM

## 2014-06-24 MED ORDER — FLUTICASONE PROPIONATE 50 MCG/ACT NA SUSP: 2.0000 | Freq: Every day | NASAL | Status: DC

## 2014-06-24 MED ORDER — CLONAZEPAM 1 MG PO TABS
1.0000 mg | ORAL_TABLET | Freq: Every day | ORAL | Status: DC
Start: 2014-06-24 — End: 2015-04-09

## 2014-06-24 MED ORDER — VENLAFAXINE HCL ER 150 MG PO CP24: 150.0000 mg | ORAL_CAPSULE | Freq: Every day | ORAL | Status: DC

## 2014-06-24 NOTE — Progress Notes (Signed)
Pre visit review using our clinic review tool, if applicable. No additional management support is needed unless otherwise documented below in the visit note. 

## 2014-06-24 NOTE — Progress Notes (Signed)
   Subjective:    Patient ID: Norma [name], female    DOB: 07/26/1971, 43 y.o.   MRN: [id number]  HPI OSA- moderate to severe OSA, now wearing CPAP which improves fatigue and snoring but pt is having increased anxiety due to mask.  Difficulty falling asleep and if she wakes up at night, has to remove mask.  On Clonopin nightly for RLS- has increased to 1.5 tabs w/o improvement in anxiety or sleep.   Review of Systems For ROS see HPI     Objective:   Physical Exam  Vitals reviewed. Constitutional: She is oriented to person, place, and time. She appears well-developed and well-nourished. No distress.  obese  HENT:  Head: Normocephalic and atraumatic.  Cardiovascular: Normal rate, regular rhythm and normal heart sounds.   Pulmonary/Chest: Effort normal and breath sounds normal. No respiratory distress. She has no wheezes. She has no rales.  Neurological: She is alert and oriented to person, place, and time. No cranial nerve deficit. Coordination normal.  Psychiatric: She has a normal mood and affect. Her behavior is normal. Thought content normal.          Assessment & Plan:

## 2014-06-24 NOTE — Patient Instructions (Signed)
Follow up in 6 weeks to recheck mood/anxiety Increase the Effexor to 150mg - 2 of what you have at home and 1 of the new script Increase the Klonopin to 1mg  nightly- 2 of what you have at home and 1 of the new script Again consider counseling- even if he won't do it, it may help you Call with any questions or concerns Hang in there!!!

## 2014-06-25 NOTE — Assessment & Plan Note (Signed)
Deteriorated.  Pt's anxiety is worse and she is having increased difficulty w/ sleeping.  Will increase both her Effexor and the Clonazepam.  Discussed need for family counseling.  Pt to try again and have husband participate.  Total time spent w/ pt, 25 minutes, >50% spent counseling.

## 2014-07-01 ENCOUNTER — Encounter: Payer: Self-pay | Admitting: Family Medicine

## 2014-08-05 ENCOUNTER — Ambulatory Visit: Payer: BC Managed Care – PPO | Admitting: Family Medicine

## 2014-12-04 ENCOUNTER — Ambulatory Visit: Payer: BC Managed Care – PPO | Admitting: Pulmonary Disease

## 2014-12-06 ENCOUNTER — Other Ambulatory Visit: Payer: Self-pay | Admitting: General Practice

## 2014-12-06 ENCOUNTER — Other Ambulatory Visit: Payer: Self-pay | Admitting: Family Medicine

## 2014-12-06 MED ORDER — CYCLOBENZAPRINE HCL 10 MG PO TABS: 10.0000 mg | ORAL_TABLET | Freq: Three times a day (TID) | ORAL | Status: DC | PRN

## 2014-12-06 NOTE — Telephone Encounter (Signed)
Med filled.  

## 2014-12-18 ENCOUNTER — Encounter: Payer: Self-pay | Admitting: Pulmonary Disease

## 2014-12-18 ENCOUNTER — Ambulatory Visit (INDEPENDENT_AMBULATORY_CARE_PROVIDER_SITE_OTHER): Payer: BLUE CROSS/BLUE SHIELD | Admitting: Pulmonary Disease

## 2014-12-18 VITALS — BP 110/70 | HR 91 | Temp 97.6°F | Ht 68.0 in | Wt 257.0 lb

## 2014-12-18 DIAGNOSIS — G4733 Obstructive sleep apnea (adult) (pediatric): Secondary | ICD-10-CM

## 2014-12-18 NOTE — Patient Instructions (Signed)
Get back on cpap regularly now that your sinus issues are improving. Will send a note again to your home care company to change your pressure to 5-15. Work on weight reduction followup again in one year, but call if having cpap tolerance issues.

## 2014-12-18 NOTE — Assessment & Plan Note (Signed)
The pt is having issues with cpap compliance because of sinus issues, but now are improving and she is getting back on the device consistently.  She feels much better when she wears her device regularly, and has improved daytime alertness.  I have asked her to keep up with mask changes and supplies, and to work on weight reduction.

## 2014-12-18 NOTE — Progress Notes (Signed)
   Subjective:    Patient ID: Norma [name], female    DOB: 08/07/1971, 44 y.o.   MRN: [id number]  HPI The patient comes in today for follow-up of her obstructive sleep apnea. She has been having issues with C Pap compliance because of ongoing chronic sinus issues. These have finally started to resolve, and she is been wearing on a more consistent basis with a good response to treatment. She denies any mask fit issues, and I do note on her download today that her pressure was never increased despite sending in order to her home care company. She feels that she is greatly improved from an alertness standpoint during the day when she wears her device consistently.   Review of Systems  Constitutional: Negative for fever and unexpected weight change.  HENT: Positive for congestion, postnasal drip and sinus pressure. Negative for dental problem, ear pain, nosebleeds, rhinorrhea, sneezing, sore throat and trouble swallowing.   Eyes: Negative for redness and itching.  Respiratory: Negative for cough, chest tightness, shortness of breath and wheezing.   Cardiovascular: Negative for palpitations and leg swelling.  Gastrointestinal: Negative for nausea and vomiting.  Genitourinary: Negative for dysuria.  Musculoskeletal: Negative for joint swelling.  Skin: Negative for rash.  Neurological: Positive for headaches.  Hematological: Does not bruise/bleed easily.  Psychiatric/Behavioral: Negative for dysphoric mood. The patient is not nervous/anxious.        Objective:   Physical Exam Obese female in no acute distress Nose without purulence or discharge noted Neck without lymphadenopathy or thyromegaly No skin breakdown or pressure necrosis from the C Pap mask Lower extremities with minimal edema, no cyanosis Alert and oriented, moves all 4 extremities.       Assessment & Plan:

## 2014-12-26 ENCOUNTER — Telehealth: Payer: Self-pay | Admitting: *Deleted

## 2014-12-26 NOTE — Telephone Encounter (Signed)
Pt has annual scheduled on 02/20/15 has Mirena IUD will have removed at annual. Asked if this okay, I told pt yes make sure up back condoms if sexually active.

## 2015-02-20 ENCOUNTER — Encounter: Payer: Self-pay | Admitting: Gynecology

## 2015-02-20 ENCOUNTER — Other Ambulatory Visit (HOSPITAL_COMMUNITY)
Admission: RE | Admit: 2015-02-20 | Discharge: 2015-02-20 | Disposition: A | Discharge status: Home / Self Care | Payer: BLUE CROSS/BLUE SHIELD | Source: Ambulatory Visit | Attending: Gynecology | Admitting: Gynecology

## 2015-02-20 ENCOUNTER — Ambulatory Visit (INDEPENDENT_AMBULATORY_CARE_PROVIDER_SITE_OTHER): Payer: BLUE CROSS/BLUE SHIELD | Admitting: Gynecology

## 2015-02-20 VITALS — BP 120/76 | Ht 68.0 in | Wt 258.0 lb

## 2015-02-20 DIAGNOSIS — Z1151 Encounter for screening for human papillomavirus (HPV): Secondary | ICD-10-CM | POA: Diagnosis present

## 2015-02-20 DIAGNOSIS — Z01419 Encounter for gynecological examination (general) (routine) without abnormal findings: Secondary | ICD-10-CM | POA: Insufficient documentation

## 2015-02-20 NOTE — Addendum Note (Signed)
Addended by: Dayna Barker on: 02/20/2015 11:12 AM   Modules accepted: Orders

## 2015-02-20 NOTE — Progress Notes (Signed)
Norma [name] [birth year] [id number]        44 y.o.  G2P2 for annual exam.  Doing well without complaints  Past medical history,surgical history, problem list, medications, allergies, family history and social history were all reviewed and documented as reviewed in the EPIC chart.  ROS:  Performed with pertinent positives and negatives included in the history, assessment and plan.   Additional significant findings :  none   Exam: [name] person Vitals:   02/20/15 0937  BP: 120/76  Height: 5\' 8"  (1.727 m)  Weight: 258 lb (117.028 kg)   General appearance:  Normal affect, orientation and appearance. Skin: Grossly normal HEENT: Without gross lesions.  No cervical or supraclavicular adenopathy. Thyroid normal.  Lungs:  Clear without wheezing, rales or rhonchi Cardiac: RR, without RMG Abdominal:  Soft, nontender, without masses, guarding, rebound, organomegaly or hernia Breasts:  Examined lying and sitting without masses, retractions, discharge or axillary adenopathy. Pelvic:  Ext/BUS/vagina normal  Cervix normal. IUD string visualized with colposcope within external os. Pap/HPV  Uterus anteverted, normal size, shape and contour, midline and mobile nontender   Adnexa  Without masses or tenderness    Anus and perineum  Normal   Rectovaginal  Normal sphincter tone without palpated masses or tenderness.    Assessment/Plan:  44 y.o. G2P2 female for annual exam without menses, Mirena IUD.   1. Mirena IUD 06/2010. Reminded patient it needs to be replaced this coming November and she will follow up for this. IUD string visualized with colposcopy right within the external os. 2. Mammography never. Recommended patient schedule baseline now and she agrees to do so. SBE monthly reviewed. 3. Health maintenance. Patient reports routine blood work done at Dr. Rennis Golden office. Follow up for IUD replacement in November otherwise annual exam in one year.   [name], 9:59 AM  02/20/2015

## 2015-02-20 NOTE — Patient Instructions (Addendum)
Follow up in November to have your IUD replaced.  Call to Schedule your mammogram  Facilities in [place]: 1)  [facility], [address] Phone: [phone] 2)  [facility]. [address] [place] Phone: [phone] 3)  Dr. Isaiah Blakes at [facility] [address] Phone: [phone]     Mammogram A mammogram is an X-ray test to find changes in a woman's breast. You should get a mammogram if:  You are 47 years of age or older  You have risk factors.   Your doctor recommends that you have one.  BEFORE THE TEST  Do not schedule the test the week before your period, especially if your breasts are sore during this time.  On the day of your mammogram:  Wash your breasts and armpits well. After washing, do not put on any deodorant or talcum powder on until after your test.   Eat and drink as you usually do.   Take your medicines as usual.   If you are diabetic and take insulin, make sure you:   Eat before coming for your test.   Take your insulin as usual.   If you cannot keep your appointment, call before the appointment to cancel. Schedule another appointment.  TEST  You will need to undress from the waist up. You will put on a hospital gown.   Your breast will be put on the mammogram machine, and it will press firmly on your breast with a piece of plastic called a compression paddle. This will make your breast flatter so that the machine can X-ray all parts of your breast.   Both breasts will be X-rayed. Each breast will be X-rayed from above and from the side. An X-ray might need to be taken again if the picture is not good enough.   The mammogram will last about 15 to 30 minutes.  AFTER THE TEST Finding out the results of your test Ask when your test results will be ready. Make sure you get your test results.  Document Released: 11/12/2008 Document Revised: 08/05/2011  Document Reviewed: 11/12/2008 [facility] Patient Information 2012 [place].  You may obtain a copy of any labs that were done today by logging onto MyChart as outlined in the instructions provided with your AVS (after visit summary). The office will not call with normal lab results but certainly if there are any significant abnormalities then we will contact you.   Health Maintenance, Female A healthy lifestyle and preventative care can promote health and wellness.  Maintain regular health, dental, and eye exams.  Eat a healthy diet. Foods like vegetables, fruits, whole grains, low-fat dairy products, and lean protein foods contain the nutrients you need without too many calories. Decrease your intake of foods high in solid fats, added sugars, and salt. Get information about a proper diet from your caregiver, if necessary.  Regular physical exercise is one of the most important things you can do for your health. Most adults should get at least 150 minutes of moderate-intensity exercise (any activity that increases your heart rate and causes you to sweat) each week. In addition, most adults need muscle-strengthening exercises on 2 or more days a week.   Maintain a healthy weight. The body mass index (BMI) is a screening tool to identify possible weight problems. It provides an estimate of body fat based on height and weight. Your caregiver can help determine your BMI, and can  help you achieve or maintain a healthy weight. For adults 20 years and older:  A BMI below 18.5 is considered underweight.  A BMI of 18.5 to 24.9 is normal.  A BMI of 25 to 29.9 is considered overweight.  A BMI of 30 and above is considered obese.  Maintain normal blood lipids and cholesterol by exercising and minimizing your intake of saturated fat. Eat a balanced diet with plenty of fruits and vegetables. Blood tests for lipids and cholesterol should begin at age 70 and be repeated every 5 years. If your lipid or  cholesterol levels are high, you are over 50, or you are a high risk for heart disease, you may need your cholesterol levels checked more frequently.Ongoing high lipid and cholesterol levels should be treated with medicines if diet and exercise are not effective.  If you smoke, find out from your caregiver how to quit. If you do not use tobacco, do not start.  Lung cancer screening is recommended for adults aged 17 80 years who are at high risk for developing lung cancer because of a history of smoking. Yearly low-dose computed tomography (CT) is recommended for people who have at least a 30-pack-year history of smoking and are a current smoker or have quit within the past 15 years. A pack year of smoking is smoking an average of 1 pack of cigarettes a day for 1 year (for example: 1 pack a day for 30 years or 2 packs a day for 15 years). Yearly screening should continue until the smoker has stopped smoking for at least 15 years. Yearly screening should also be stopped for people who develop a health problem that would prevent them from having lung cancer treatment.  If you are pregnant, do not drink alcohol. If you are breastfeeding, be very cautious about drinking alcohol. If you are not pregnant and choose to drink alcohol, do not exceed 1 drink per day. One drink is considered to be 12 ounces (355 mL) of beer, 5 ounces (148 mL) of wine, or 1.5 ounces (44 mL) of liquor.  Avoid use of street drugs. Do not share needles with anyone. Ask for help if you need support or instructions about stopping the use of drugs.  High blood pressure causes heart disease and increases the risk of stroke. Blood pressure should be checked at least every 1 to 2 years. Ongoing high blood pressure should be treated with medicines, if weight loss and exercise are not effective.  If you are 31 to 42 years old, ask your caregiver if you should take aspirin to prevent strokes.  Diabetes screening involves taking a blood sample  to check your fasting blood sugar level. This should be done once every 3 years, after age 62, if you are within normal weight and without risk factors for diabetes. Testing should be considered at a younger age or be carried out more frequently if you are overweight and have at least 1 risk factor for diabetes.  Breast cancer screening is essential preventative care for women. You should practice "breast self-awareness." This means understanding the normal appearance and feel of your breasts and may include breast self-examination. Any changes detected, no matter how small, should be reported to a caregiver. Women in their 47s and 30s should have a clinical breast exam (CBE) by a caregiver as part of a regular health exam every 1 to 3 years. After age 27, women should have a CBE every year. Starting at age 24, women should consider having  a mammogram (breast X-ray) every year. Women who have a family history of breast cancer should talk to their caregiver about genetic screening. Women at a high risk of breast cancer should talk to their caregiver about having an MRI and a mammogram every year.  Breast cancer gene (BRCA)-related cancer risk assessment is recommended for women who have family members with BRCA-related cancers. BRCA-related cancers include breast, ovarian, tubal, and peritoneal cancers. Having family members with these cancers may be associated with an increased risk for harmful changes (mutations) in the breast cancer genes BRCA1 and BRCA2. Results of the assessment will determine the need for genetic counseling and BRCA1 and BRCA2 testing.  The Pap test is a screening test for cervical cancer. Women should have a Pap test starting at age 46. Between ages 78 and 13, Pap tests should be repeated every 2 years. Beginning at age 88, you should have a Pap test every 3 years as long as the past 3 Pap tests have been normal. If you had a hysterectomy for a problem that was not cancer or a condition  that could lead to cancer, then you no longer need Pap tests. If you are between ages 12 and 55, and you have had normal Pap tests going back 10 years, you no longer need Pap tests. If you have had past treatment for cervical cancer or a condition that could lead to cancer, you need Pap tests and screening for cancer for at least 20 years after your treatment. If Pap tests have been discontinued, risk factors (such as a new sexual partner) need to be reassessed to determine if screening should be resumed. Some women have medical problems that increase the chance of getting cervical cancer. In these cases, your caregiver may recommend more frequent screening and Pap tests.  The human papillomavirus (HPV) test is an additional test that may be used for cervical cancer screening. The HPV test looks for the virus that can cause the cell changes on the cervix. The cells collected during the Pap test can be tested for HPV. The HPV test could be used to screen women aged 76 years and older, and should be used in women of any age who have unclear Pap test results. After the age of 8, women should have HPV testing at the same frequency as a Pap test.  Colorectal cancer can be detected and often prevented. Most routine colorectal cancer screening begins at the age of 85 and continues through age 65. However, your caregiver may recommend screening at an earlier age if you have risk factors for colon cancer. On a yearly basis, your caregiver may provide home test kits to check for hidden blood in the stool. Use of a small camera at the end of a tube, to directly examine the colon (sigmoidoscopy or colonoscopy), can detect the earliest forms of colorectal cancer. Talk to your caregiver about this at age 69, when routine screening begins. Direct examination of the colon should be repeated every 5 to 10 years through age 24, unless early forms of pre-cancerous polyps or small growths are found.  Hepatitis C blood testing is  recommended for all people born from [birth year] through 1963 and any individual with known risks for hepatitis C.  Practice safe sex. Use condoms and avoid high-risk sexual practices to reduce the spread of sexually transmitted infections (STIs). Sexually active women aged 95 and younger should be checked for Chlamydia, which is a common sexually transmitted infection. Older women with new or  multiple partners should also be tested for Chlamydia. Testing for other STIs is recommended if you are sexually active and at increased risk.  Osteoporosis is a disease in which the bones lose minerals and strength with aging. This can result in serious bone fractures. The risk of osteoporosis can be identified using a bone density scan. Women ages 84 and over and women at risk for fractures or osteoporosis should discuss screening with their caregivers. Ask your caregiver whether you should be taking a calcium supplement or vitamin D to reduce the rate of osteoporosis.  Menopause can be associated with physical symptoms and risks. Hormone replacement therapy is available to decrease symptoms and risks. You should talk to your caregiver about whether hormone replacement therapy is right for you.  Use sunscreen. Apply sunscreen liberally and repeatedly throughout the day. You should seek shade when your shadow is shorter than you. Protect yourself by wearing long sleeves, pants, a wide-brimmed hat, and sunglasses year round, whenever you are outdoors.  Notify your caregiver of new moles or changes in moles, especially if there is a change in shape or color. Also notify your caregiver if a mole is larger than the size of a pencil eraser.  Stay current with your immunizations. Document Released: 03/01/2011 Document Revised: 12/11/2012 Document Reviewed: 03/01/2011 [facility] Patient Information 2014 [place].

## 2015-02-21 LAB — CYTOLOGY - PAP

## 2015-02-24 ENCOUNTER — Telehealth: Payer: Self-pay | Admitting: Gynecology

## 2015-02-24 NOTE — Telephone Encounter (Signed)
02/24/15-[facility] and insertion of new for contraception under her $35 copay/wl

## 2015-04-09 ENCOUNTER — Other Ambulatory Visit: Payer: Self-pay | Admitting: Family Medicine

## 2015-04-09 NOTE — Telephone Encounter (Signed)
Medication filled to pharmacy as requested.   

## 2015-04-09 NOTE — Telephone Encounter (Signed)
Will approve but pt needs to schedule appt

## 2015-04-09 NOTE — Telephone Encounter (Signed)
Last OV 06/24/14, no upcoming appts Clonazepam last filled 06/24/14 #30 with 3 effexor last filled 12/06/14 #30 with 3

## 2015-07-01 HISTORY — PX: INTRAUTERINE DEVICE INSERTION: SHX323

## 2015-07-09 ENCOUNTER — Encounter: Payer: Self-pay | Admitting: Gynecology

## 2015-07-09 ENCOUNTER — Ambulatory Visit (INDEPENDENT_AMBULATORY_CARE_PROVIDER_SITE_OTHER): Payer: BLUE CROSS/BLUE SHIELD | Admitting: Gynecology

## 2015-07-09 VITALS — BP 122/78

## 2015-07-09 DIAGNOSIS — Z30433 Encounter for removal and reinsertion of intrauterine contraceptive device: Secondary | ICD-10-CM

## 2015-07-09 NOTE — Progress Notes (Signed)
Patient presents for Mirena IUD removal and replacement. She has read through the booklet, has no contraindications and signed the consent form.  I reviewed the removal and insertional process with her as well as the risks to include infection, either immediate or long-term, uterine perforation or migration requiring surgery to remove, other complications such as pain, hormonal side effects and possibility of failure with subsequent pregnancy.   Exam with [name] assistant Pelvic: External BUS vagina normal. Cervix normal with IUD string visualized. Uterus anteverted normal size shape contour midline mobile nontender. Adnexa without masses or tenderness.  Procedure: The cervix was visualized with a speculum and the old arena IUD string was grasped with a [place]zeman forcep, removed, shown to the patient and discarded.  The cervix was then cleansed with Betadine, anterior lip grasped with a single-tooth tenaculum, the uterus was sounded and a new Mirena IUD was placed according to manufacturer's recommendations without difficulty. The strings were trimmed. The patient tolerated well and will follow up in one month for a postinsertional check.  Lot number:  [id number]    Dara LordsFONTAINE,TIMOTHY P MD, 10:03 AM 07/09/2015

## 2015-07-09 NOTE — Patient Instructions (Signed)
Intrauterine Device Insertion Most often, an intrauterine device (IUD) is inserted into the uterus to prevent pregnancy. There are 2 types of IUDs available:  Copper IUD--This type of IUD creates an environment that is not favorable to sperm survival. The mechanism of action of the copper IUD is not known for certain. It can stay in place for 10 years.  Hormone IUD--This type of IUD contains the hormone progestin (synthetic progesterone). The progestin thickens the cervical mucus and prevents sperm from entering the uterus, and it also thins the uterine lining. There is no evidence that the hormone IUD prevents implantation. One hormone IUD can stay in place for up to 5 years, and a different hormone IUD can stay in place for up to 3 years. An IUD is the most cost-effective birth control if left in place for the full duration. It may be removed at any time. LET YOUR HEALTH CARE PROVIDER KNOW ABOUT:  Any allergies you have.  All medicines you are taking, including vitamins, herbs, eye drops, creams, and over-the-counter medicines.  Previous problems you or members of your family have had with the use of anesthetics.  Any blood disorders you have.  Previous surgeries you have had.  Possibility of pregnancy.  Medical conditions you have. RISKS AND COMPLICATIONS  Generally, intrauterine device insertion is a safe procedure. However, as with any procedure, complications can occur. Possible complications include:  Accidental puncture (perforation) of the uterus.  Accidental placement of the IUD either in the muscle layer of the uterus (myometrium) or outside the uterus. If this happens, the IUD can be found essentially floating around the bowels and must be taken out surgically.  The IUD may fall out of the uterus (expulsion). This is more common in women who have recently had a child.   Pregnancy in the fallopian tube (ectopic).  Pelvic inflammatory disease (PID), which is infection of  the uterus and fallopian tubes. The risk of PID is slightly increased in the first 20 days after the IUD is placed, but the overall risk is still very low. BEFORE THE PROCEDURE  Schedule the IUD insertion for when you will have your menstrual period or right after, to make sure you are not pregnant. Placement of the IUD is better tolerated shortly after a menstrual cycle.  You may need to take tests or be examined to make sure you are not pregnant.  You may be required to take a pregnancy test.  You may be required to get checked for sexually transmitted infections (STIs) prior to placement. Placing an IUD in someone who has an infection can make the infection worse.  You may be given a pain reliever to take 1 or 2 hours before the procedure.  An exam will be performed to determine the size and position of your uterus.  Ask your health care provider about changing or stopping your regular medicines. PROCEDURE   A tool (speculum) is placed in the vagina. This allows your health care provider to see the lower part of the uterus (cervix).  The cervix is prepped with a medicine that lowers the risk of infection.  You may be given a medicine to numb each side of the cervix (intracervical or paracervical block). This is used to block and control any discomfort with insertion.  A tool (uterine sound) is inserted into the uterus to determine the length of the uterine cavity and the direction the uterus may be tilted.  A slim instrument (IUD inserter) is inserted through the cervical   canal and into your uterus.  The IUD is placed in the uterine cavity and the insertion device is removed.  The nylon string that is attached to the IUD and used for eventual IUD removal is trimmed. It is trimmed so that it lays high in the vagina, just outside the cervix. AFTER THE PROCEDURE  You may have bleeding after the procedure. This is normal. It varies from light spotting for a few days to menstrual-like  bleeding.  You may have mild cramping.   This information is not intended to replace advice given to you by your health care provider. Make sure you discuss any questions you have with your health care provider.   Document Released: 04/14/2011 Document Revised: 06/06/2013 Document Reviewed: 02/04/2013 Elsevier Interactive Patient Education 2016 Elsevier Inc.  

## 2015-07-11 ENCOUNTER — Telehealth: Payer: Self-pay | Admitting: *Deleted

## 2015-07-11 ENCOUNTER — Encounter: Payer: Self-pay | Admitting: Gynecology

## 2015-07-11 ENCOUNTER — Ambulatory Visit (INDEPENDENT_AMBULATORY_CARE_PROVIDER_SITE_OTHER): Payer: BLUE CROSS/BLUE SHIELD | Admitting: Gynecology

## 2015-07-11 VITALS — BP 122/78 | Temp 98.0°F

## 2015-07-11 DIAGNOSIS — G8918 Other acute postprocedural pain: Secondary | ICD-10-CM

## 2015-07-11 NOTE — Patient Instructions (Signed)
Follow up in a month or so for replacement of your IUD.

## 2015-07-11 NOTE — Telephone Encounter (Signed)
Pt called has Mirena IUD placed on 07/09/15 called today c/o 8-10 pain lower back pain with intense cramping. Pt said she has taking at least 1,000 mg of ibuprofen and it doesn't touch the pain, pain is constant with no relief. Pt said pain has been intense since placement on 07/09/15. Pt told to come now to be worked in.

## 2015-07-11 NOTE — Progress Notes (Signed)
Norma InglesDean [name] 01/22/1971 [id number]        44 y.o.  G2P2 Presents complaining of ongoing pain since replacement of her Mirena IUD 07/09/2015. Notes a primarily left-sided pain, aching cramping and sharp stabbing on and off.  No fever or chills or bleeding. Taking high doses of Tylenol but still unrelenting keeping her up at night. Request that the IUD be removed regardless of my assessment.  Past medical history,surgical history, problem list, medications, allergies, family history and social history were all reviewed and documented in the EPIC chart.  Directed ROS with pertinent positives and negatives documented in the history of present illness/assessment and plan.  Exam:  Norma [name] assistant Filed Vitals:   07/11/15 1541  BP: 122/78  Temp: 98 F (36.7 C)  TempSrc: Oral   General appearance:  Normal Abdomen soft nontender without masses guarding rebound Pelvic external BUS vagina normal. Cervix normal with IUD string visualized. Uterus grossly normal midline mobile nontender. Adnexa without masses or tenderness.  Patient's Mirena IUD was removed with a [place] forcep, shown to the patient and discarded without difficulty.  Assessment/Plan:  44 y.o. G2P2 with pain following IUD placement. Discussed with patient leaving the IUD in treating the pain for now or evaluation with ultrasound but given her degree of pain she wants it removed now.  Being that she had an IUD previously I suspect that this IUD was malaligned and pushing on the myometrium causing the pain.   I discussed with the patient that this does not mean that she cannot try another IUD if she chooses. Patient does want to do so and she's going to wait a month and then return for placement. ASAP call precautions reviewed to include fever or chills worsening pain following this removal.    Norma LordsFONTAINE,Norma [name] P MD, 4:02 PM 07/11/2015

## 2015-08-07 ENCOUNTER — Ambulatory Visit: Payer: BLUE CROSS/BLUE SHIELD | Admitting: Gynecology

## 2015-08-24 ENCOUNTER — Other Ambulatory Visit: Payer: Self-pay | Admitting: Family Medicine

## 2015-08-26 NOTE — Telephone Encounter (Signed)
Medication filled to pharmacy as requested.   

## 2015-10-22 ENCOUNTER — Ambulatory Visit (INDEPENDENT_AMBULATORY_CARE_PROVIDER_SITE_OTHER): Payer: BLUE CROSS/BLUE SHIELD | Admitting: Family Medicine

## 2015-10-22 ENCOUNTER — Encounter: Payer: Self-pay | Admitting: Family Medicine

## 2015-10-22 VITALS — BP 124/80 | HR 107 | Temp 98.1°F | Ht 68.0 in | Wt 260.2 lb

## 2015-10-22 DIAGNOSIS — E669 Obesity, unspecified: Secondary | ICD-10-CM

## 2015-10-22 DIAGNOSIS — F418 Other specified anxiety disorders: Secondary | ICD-10-CM | POA: Diagnosis not present

## 2015-10-22 LAB — HEPATIC FUNCTION PANEL
ALT: 25 U/L (ref 0–35)
AST: 20 U/L (ref 0–37)
Albumin: 4.4 g/dL (ref 3.5–5.2)
Alkaline Phosphatase: 86 U/L (ref 39–117)
Bilirubin, Direct: 0.1 mg/dL (ref 0.0–0.3)
Total Bilirubin: 0.5 mg/dL (ref 0.2–1.2)
Total Protein: 7.4 g/dL (ref 6.0–8.3)

## 2015-10-22 LAB — LIPID PANEL
Cholesterol: 178 mg/dL (ref 0–200)
HDL: 48.5 mg/dL (ref >39.00)
NonHDL: 129.11
Total CHOL/HDL Ratio: 4
Triglycerides: 207 mg/dL — ABNORMAL HIGH (ref 0.0–149.0)
VLDL: 41.4 mg/dL — ABNORMAL HIGH (ref 0.0–40.0)

## 2015-10-22 LAB — CBC WITH DIFFERENTIAL/PLATELET
Basophils Absolute: 0 10*3/uL (ref 0.0–0.1)
Basophils Relative: 0.5 % (ref 0.0–3.0)
Eosinophils Absolute: 0.2 10*3/uL (ref 0.0–0.7)
Eosinophils Relative: 2.2 % (ref 0.0–5.0)
HCT: 37.4 % (ref 36.0–46.0)
Hemoglobin: 12.6 g/dL (ref 12.0–15.0)
Lymphocytes Relative: 26.8 % (ref 12.0–46.0)
Lymphs Abs: 1.8 10*3/uL (ref 0.7–4.0)
MCHC: 33.6 g/dL (ref 30.0–36.0)
MCV: 82.3 fl (ref 78.0–100.0)
Monocytes Absolute: 0.3 10*3/uL (ref 0.1–1.0)
Monocytes Relative: 4.6 % (ref 3.0–12.0)
Neutro Abs: 4.5 10*3/uL (ref 1.4–7.7)
Neutrophils Relative %: 65.9 % (ref 43.0–77.0)
Platelets: 188 10*3/uL (ref 150.0–400.0)
RBC: 4.55 Mil/uL (ref 3.87–5.11)
RDW: 14.4 % (ref 11.5–15.5)
WBC: 6.8 10*3/uL (ref 4.0–10.5)

## 2015-10-22 LAB — BASIC METABOLIC PANEL
BUN: 18 mg/dL (ref 6–23)
CO2: 30 meq/L (ref 19–32)
CREATININE: 0.83 mg/dL (ref 0.40–1.20)
Calcium: 9.3 mg/dL (ref 8.4–10.5)
Chloride: 103 mEq/L (ref 96–112)
GFR: 79.92 mL/min (ref >60.00)
GLUCOSE: 121 mg/dL — AB (ref 70–99)
Potassium: 3.9 mEq/L (ref 3.5–5.1)
Sodium: 139 mEq/L (ref 135–145)

## 2015-10-22 LAB — LDL CHOLESTEROL, DIRECT: Direct LDL: 105 mg/dL

## 2015-10-22 LAB — TSH: TSH: 0.77 u[IU]/mL (ref 0.35–4.50)

## 2015-10-22 MED ORDER — LORCASERIN HCL ER 20 MG PO TB24: 1.0000 | ORAL_TABLET | Freq: Every day | ORAL | Status: DC

## 2015-10-22 NOTE — Assessment & Plan Note (Signed)
Ongoing issue.  Pt feels sxs are adequately controlled w/ Effexor and grief counseling.  Admits to emotional eating and that her weight is her biggest current stressor.  No changes to effexor at this time.  Will follow.

## 2015-10-22 NOTE — Patient Instructions (Signed)
Follow up in 8 weeks to recheck weight loss progress We'll notify you of your lab results and make any changes if needed Continue to work on healthy diet and regular exercise- you can do it!!! Start the Belviq once daily to assist w/ weight loss Call with any questions or concerns If you want to join [place] at the new [place] office, any scheduled appointments will automatically transfer and we will see you at [address] [place] [address] [place], [place], [place] [address] (OPENING [place]) [place] in there!!!

## 2015-10-22 NOTE — Progress Notes (Signed)
   Subjective:    Patient ID: Norma [name], female    DOB: 19-Sep-1970, 45 y.o.   MRN: [id number]  HPI Depression/anxiety- ongoing issue for pt, father committed suicide last year.  Pt is working through this w/ Hospice grief counseling.  She had been estranged from her father for 2 yrs due to his issues.  Pt feels that Effexor has been helping.  Not taking Klonopin nightly.  Obesity- pt continues to struggle w/ weight.  Has completely eliminated fast food.  Better food choices.  Eating breakfast regularly.  Pt reports to some stress eating w/ death of father.  Pt reports she 'never feels satisfied'.  Friend has started Belviq which curbed her need to snack.  Pt has tried Weight Watchers in the past w/o success.  Pt is attempting to walk more regularly.   Review of Systems For ROS see HPI     Objective:   Physical Exam  Constitutional: She is oriented to person, place, and time. She appears well-developed and well-nourished. No distress.  HENT:  Head: Normocephalic and atraumatic.  Eyes: Conjunctivae and EOM are normal. Pupils are equal, round, and reactive to light.  Neck: Normal range of motion. Neck supple. No thyromegaly present.  Cardiovascular: Normal rate, regular rhythm, normal heart sounds and intact distal pulses.   No murmur heard. Pulmonary/Chest: Effort normal and breath sounds normal. No respiratory distress.  Abdominal: Soft. She exhibits no distension. There is no tenderness.  Musculoskeletal: She exhibits no edema.  Lymphadenopathy:    She has no cervical adenopathy.  Neurological: She is alert and oriented to person, place, and time.  Skin: Skin is warm and dry.  Psychiatric: She has a normal mood and affect. Her behavior is normal.  Vitals reviewed.         Assessment & Plan:

## 2015-10-22 NOTE — Progress Notes (Signed)
Pre visit review using our clinic review tool, if applicable. No additional management support is needed unless otherwise documented below in the visit note. 

## 2015-10-22 NOTE — Assessment & Plan Note (Signed)
Chronic problem.  Pt has tried multiple structured weight loss plans w/o success.  She has changed her grocery shopping and eliminated fast food completely.  Applauded these efforts.  She is interested in [place] b/c her friend is having success on this.  Start medication and continue to follow.  Pt expressed understanding and is in agreement w/ plan.

## 2015-10-23 ENCOUNTER — Other Ambulatory Visit (INDEPENDENT_AMBULATORY_CARE_PROVIDER_SITE_OTHER): Payer: BLUE CROSS/BLUE SHIELD

## 2015-10-23 DIAGNOSIS — R7309 Other abnormal glucose: Secondary | ICD-10-CM

## 2015-10-23 LAB — HEMOGLOBIN A1C: HEMOGLOBIN A1C: 5.9 % (ref 4.6–6.5)

## 2015-12-02 ENCOUNTER — Other Ambulatory Visit: Payer: Self-pay | Admitting: General Practice

## 2015-12-02 MED ORDER — CLONAZEPAM 1 MG PO TABS: 1.0000 mg | ORAL_TABLET | Freq: Every day | ORAL | Status: DC

## 2015-12-02 NOTE — Telephone Encounter (Signed)
Medication filled to pharmacy as requested.   

## 2015-12-02 NOTE — Telephone Encounter (Signed)
Last ov 10/22/15 Clonazepam last filled 04/09/15 #30 with 3

## 2015-12-10 ENCOUNTER — Ambulatory Visit: Payer: BLUE CROSS/BLUE SHIELD | Admitting: Family Medicine

## 2015-12-15 ENCOUNTER — Ambulatory Visit (INDEPENDENT_AMBULATORY_CARE_PROVIDER_SITE_OTHER): Payer: BLUE CROSS/BLUE SHIELD | Admitting: Family Medicine

## 2015-12-15 ENCOUNTER — Encounter: Payer: Self-pay | Admitting: Family Medicine

## 2015-12-15 VITALS — BP 123/78 | HR 71 | Temp 98.0°F | Resp 16 | Ht 68.0 in | Wt 255.2 lb

## 2015-12-15 DIAGNOSIS — E669 Obesity, unspecified: Secondary | ICD-10-CM | POA: Diagnosis not present

## 2015-12-15 NOTE — Patient Instructions (Signed)
Schedule your complete physical for 6 months Continue the Belviq You look great!  Keep up the good work! Call with any questions or concerns Thanks for sticking with us! Enjoy the beach!!!

## 2015-12-15 NOTE — Progress Notes (Signed)
Pre visit review using our clinic review tool, if applicable. No additional management support is needed unless otherwise documented below in the visit note. 

## 2015-12-15 NOTE — Assessment & Plan Note (Signed)
Ongoing issue for pt.  She is tolerating Belviq w/o difficulty.  Feels it is effective in curbing her appetite.  [facility] which she is [facility].  Applauded her efforts.  Will follow.

## 2015-12-15 NOTE — Progress Notes (Signed)
   Subjective:    Patient ID: Norma [name], female    DOB: 10/01/1970, 45 y.o.   MRN: [id number]  HPI Obesity- pt started Belviq on 2/22 but stopped due to possibility of pregnancy.  Just restarted meds 'a few weeks ago'.  Has lost 5 lbs.  Has started '[employer]' 3x/week.  Clothes are fitting better than previously.  Pt has decreased portions and eating more frequently.  Has cut out late night snacking- feels Belviq is helping to curb appetite.  No CP, SOB, HAs, abd pain.   Review of Systems For ROS see HPI     Objective:   Physical Exam  Constitutional: She is oriented to person, place, and time. She appears well-developed and well-nourished. No distress.  HENT:  Head: Normocephalic and atraumatic.  Eyes: Conjunctivae and EOM are normal. Pupils are equal, round, and reactive to light.  Neck: Normal range of motion. Neck supple. No thyromegaly present.  Cardiovascular: Normal rate, regular rhythm, normal heart sounds and intact distal pulses.   No murmur heard. Pulmonary/Chest: Effort normal and breath sounds normal. No respiratory distress.  Abdominal: Soft. She exhibits no distension. There is no tenderness.  Musculoskeletal: She exhibits no edema.  Lymphadenopathy:    She has no cervical adenopathy.  Neurological: She is alert and oriented to person, place, and time.  Skin: Skin is warm and dry.  Psychiatric: She has a normal mood and affect. Her behavior is normal.  Vitals reviewed.         Assessment & Plan:

## 2016-01-02 ENCOUNTER — Ambulatory Visit (INDEPENDENT_AMBULATORY_CARE_PROVIDER_SITE_OTHER): Payer: BLUE CROSS/BLUE SHIELD | Admitting: Pulmonary Disease

## 2016-01-02 ENCOUNTER — Encounter: Payer: Self-pay | Admitting: Pulmonary Disease

## 2016-01-02 VITALS — BP 108/82 | HR 77 | Ht 67.0 in | Wt 253.0 lb

## 2016-01-02 DIAGNOSIS — J351 Hypertrophy of tonsils: Secondary | ICD-10-CM

## 2016-01-02 DIAGNOSIS — G4733 Obstructive sleep apnea (adult) (pediatric): Secondary | ICD-10-CM | POA: Diagnosis not present

## 2016-01-02 DIAGNOSIS — J3089 Other allergic rhinitis: Secondary | ICD-10-CM | POA: Diagnosis not present

## 2016-01-02 DIAGNOSIS — Z9989 Dependence on other enabling machines and devices: Principal | ICD-10-CM

## 2016-01-02 NOTE — Patient Instructions (Signed)
Can look up following company web sites for CPAP mask options: Resmed, Respironics, [employer]  Call once you find a CPAP mask option you would like to try, and we will then send in the order  Follow up in 4 months

## 2016-01-02 NOTE — Progress Notes (Signed)
Current Outpatient Prescriptions on File Prior to Visit  Medication Sig  . CALCIUM PO Take by mouth. When remembers  . Cetirizine HCl (ZYRTEC PO) Take by mouth daily.   . Chlorpheniramine-PSE-Ibuprofen (ADVIL ALLERGY SINUS) 2-30-200 MG TABS 2-6 tabs daily  . clonazePAM (KLONOPIN) 1 MG tablet Take 1 tablet (1 mg total) by mouth at bedtime.  . cyclobenzaprine (FLEXERIL) 10 MG tablet Take 1 tablet (10 mg total) by mouth 3 (three) times daily as needed for muscle spasms.  . fluticasone (FLONASE) 50 MCG/ACT nasal spray Place 2 sprays into both nostrils daily. (Patient taking differently: Place 2 sprays into both nostrils as needed. )  . Lorcaserin HCl ER (BELVIQ XR) 20 MG TB24 Take 1 tablet by mouth daily.  . Multiple Vitamin (MULTIVITAMIN) tablet Take 1 tablet by mouth daily.  Marland Kitchen. venlafaxine XR (EFFEXOR-XR) 150 MG 24 hr capsule TAKE ONE CAPSULE BY MOUTH ONE TIME DAILY WITH BREAKFAST   No current facility-administered medications on file prior to visit.     Chief Complaint  Patient presents with  . Follow-up    Former patient [facility]: Wears CPAP very little. Pt reports break through snoring and claustrophobia. Uses Klonopin to help her relax and sleep with CPAP. DME: [facility]     Tests HST 03/14/14 >> [facility] 26  Past medical hx Anxiety, Allergies  Past surgical hx, Allergies, Family hx, Social hx all reviewed.  Vital Signs BP 108/82 mmHg  Pulse 77  Ht 5\' 7"  (1.702 m)  Wt 253 lb (114.76 kg)  BMI 39.62 kg/m2  SpO2 98%  History of Present Illness Norma [name] is a 45 y.o. female with obstructive sleep apnea.  She is having difficulty using CPAP.  She feels like she is still getting choking episodes, and her husband hears her snore when she uses CPAP.  She still feels like using CPAP helps her sleep and daytime alertness, but it has been difficult to maintain compliance.  She also has a special needs child age 156, and will frequently have to tend to her child during the night.  She has full  face mask >> told she should use this because of mouth breathing prior to CPAP therapy.  She has trouble with allergies, and chronic sinus congestion.  She also has enlarged tonsils.  She was seen previously by Dr. Jearld FentonByers with ENT and the thought was she could have reflux causing her tonsillar enlargement >> she doesn't think she has reflux.  She has been using OTC antihistamine and flonase.  She has not been on singulair before.  Physical Exam  General - No distress ENT - No sinus tenderness, no oral exudate, no LAN, MP 3, scalloped tongue, 3+ tonsils Cardiac - s1s2 regular, no murmur Chest - No wheeze/rales/dullness Back - No focal tenderness Abd - Soft, non-tender Ext - No edema Neuro - Normal strength Skin - No rashes Psych - normal mood, and behavior   Assessment/Plan  Obstructive sleep apnea. - I am concerned full face mask could be contributing to relatively higher oral pressures compared to posterior pharyngeal pressures and paradoxical airway collapse - will have her look up CPAP mask options for nasal mask or nasal pillows mask >> she will call, and then I will send order to DME - continue auto CPAP - discussed option of oral appliance >> she doesn't think she could use a mouth piece  Allergic rhinitis with tonsillar hypertrophy. - continue zyrtec, flonase, and nasal irrigation - if her CPAP tolerance doesn't improve with change in mask  type, then consider adding singulair    Patient Instructions  Can look up following company web sites for CPAP mask options: Resmed, Respironics, [employer] once you find a CPAP mask option you would like to try, and we will then send in the order  Follow up in 4 months     Coralyn Helling, MD [facility] Pager:  [phone] 01/02/2016, 10:28 AM

## 2016-01-19 ENCOUNTER — Other Ambulatory Visit: Payer: Self-pay | Admitting: General Practice

## 2016-01-19 MED ORDER — VENLAFAXINE HCL ER 150 MG PO CP24: ORAL_CAPSULE | ORAL | Status: DC

## 2016-05-11 ENCOUNTER — Telehealth: Payer: Self-pay | Admitting: General Practice

## 2016-05-11 NOTE — Telephone Encounter (Signed)
Called and spoke with patient. She advised that she has been having chest pain that radiates to her back mostly after eating. Pt said she did not go to the ED last night because the pain had subsided a little.    Pt denies and changes to her diet or spicy foods. Pt states that she is now having diarrhea and nausea after eating. Pt declined any appointments for today but she did schedule an appt with Dr. Beverely Lowabori tomorrow at 1pm.    Pt was advised to try taking immodium before meals to help with the upset stomach afterwards.

## 2016-05-11 NOTE — Telephone Encounter (Signed)
  No record of patient being seen at ED. Would you like for me to call and see if symptoms are still persistent?         [facility] - Cli TELEPHONE ADVICE RECORD [facility] Patient Name: Norma JacksonDEAN MARRONE-EDW ARDS Gender: Female DOB: 09/15/1970 Age: [age] Y 1 M 3 D Return Phone Number: [phone] (Primary) Address: City/State/Zip: [place] Client [facility] - Cli Client [facility] Day Physician Lezlie Octaveabori, Kate - MD Contact Type Call Who Is Calling Patient / Member / Family / Caregiver Call Type Triage / Clinical Relationship To Patient Self Return Phone Number [phone] (Primary) Chief Complaint Abdominal Pain Reason for Call Symptomatic / Request for Health Information Initial Comment Caller is having severe upper abdominal pain that is going through to her back, can not get comfortable Appointment Disposition EMR Appointment Not Necessary Info pasted into Epic No GOTO [facility]  [facility] PreDisposition Home Care Translation No Nurse Assessment Nurse: Lucianne LeiGreenawalt, RN, Lanora ManisElizabeth Date/Time (Eastern Time): 05/10/2016 4:51:08 PM Confirm and document reason for call. If symptomatic, describe symptoms. You must click the next button to save text entered. ---Patient states she has severe right upper abdominal pain radiating to her mid back. The pain is making her feel nauseated. Has the patient traveled out of the country within the last 30 days? ---Not Applicable Does the patient have any new or worsening symptoms? ---Yes Will a triage be completed? ---Yes Related visit to physician within the last 2 weeks? ---No Does the PT have any chronic conditions? (i.e. diabetes, asthma, etc.) ---Yes List chronic conditions. ---Depression Restless legs Is the patient pregnant or possibly pregnant? (Ask all females between the ages of  3912-55) ---No Is this a behavioral health or substance abuse call? ---No PLEASE NOTE: All timestamps contained within this report are represented as [place] Standard Time. CONFIDENTIALTY NOTICE: This fax transmission is intended only for the addressee. It contains information that is legally privileged, confidential or otherwise protected from use or disclosure. If you are not the intended recipient, you are strictly prohibited from reviewing, disclosing, copying using or disseminating any of this information or taking any action in reliance on or regarding this information. If you have received this fax in error, please notify us immediately by telephone so that we can arrange for its return to us. Phone: [phone], Toll-Free: [phone], Fax: [phone] Page: 2 of 2 Call Id: [id number] Guidelines Guideline Title Affirmed Question Affirmed Notes Nurse Date/Time Lamount Cohen(Eastern Time) Abdominal Pain - Upper [1] SEVERE pain (e.g., excruciating) AND [2] present > 1 hour Greenawalt, RN, Lanora Manislizabeth 05/10/2016 4:53:42 PM Disp. Time Lamount Cohen(Eastern Time) Disposition Final User 05/10/2016 4:58:42 PM Go to ED Now Yes Greenawalt, RN, Heloise PurpuraElizabeth Caller Understands: Yes Disagree/Comply: Comply

## 2016-05-11 NOTE — Telephone Encounter (Signed)
Please call and check on pt.

## 2016-05-12 ENCOUNTER — Ambulatory Visit: Payer: BLUE CROSS/BLUE SHIELD | Admitting: Family Medicine

## 2016-05-12 ENCOUNTER — Ambulatory Visit: Payer: BLUE CROSS/BLUE SHIELD | Admitting: Pulmonary Disease

## 2016-06-12 ENCOUNTER — Encounter (HOSPITAL_COMMUNITY): Payer: Self-pay | Admitting: Emergency Medicine

## 2016-06-12 ENCOUNTER — Emergency Department (HOSPITAL_COMMUNITY)
Admission: EM | Admit: 2016-06-12 | Discharge: 2016-06-12 | Disposition: A | Discharge status: Home / Self Care | Payer: BLUE CROSS/BLUE SHIELD | Attending: Emergency Medicine | Admitting: Emergency Medicine

## 2016-06-12 ENCOUNTER — Emergency Department (HOSPITAL_COMMUNITY): Payer: BLUE CROSS/BLUE SHIELD

## 2016-06-12 DIAGNOSIS — R197 Diarrhea, unspecified: Secondary | ICD-10-CM | POA: Diagnosis not present

## 2016-06-12 DIAGNOSIS — R1031 Right lower quadrant pain: Secondary | ICD-10-CM | POA: Diagnosis not present

## 2016-06-12 DIAGNOSIS — R112 Nausea with vomiting, unspecified: Secondary | ICD-10-CM | POA: Diagnosis present

## 2016-06-12 DIAGNOSIS — R1084 Generalized abdominal pain: Secondary | ICD-10-CM

## 2016-06-12 DIAGNOSIS — R109 Unspecified abdominal pain: Secondary | ICD-10-CM

## 2016-06-12 LAB — LIPASE, BLOOD: Lipase: 16 U/L (ref 11–51)

## 2016-06-12 LAB — COMPREHENSIVE METABOLIC PANEL
ALK PHOS: 90 U/L (ref 38–126)
ALT: 31 U/L (ref 14–54)
AST: 24 U/L (ref 15–41)
Albumin: 4.3 g/dL (ref 3.5–5.0)
Anion gap: 11 (ref 5–15)
BUN: 15 mg/dL (ref 6–20)
CALCIUM: 8.9 mg/dL (ref 8.9–10.3)
CO2: 23 mmol/L (ref 22–32)
CREATININE: 0.67 mg/dL (ref 0.44–1.00)
Chloride: 106 mmol/L (ref 101–111)
GFR calc Af Amer: >60 mL/min (ref >60)
GFR calc non Af Amer: >60 mL/min (ref >60)
GLUCOSE: 103 mg/dL — AB (ref 65–99)
Potassium: 3.8 mmol/L (ref 3.5–5.1)
Sodium: 140 mmol/L (ref 135–145)
Total Bilirubin: 0.8 mg/dL (ref 0.3–1.2)
Total Protein: 7.7 g/dL (ref 6.5–8.1)

## 2016-06-12 LAB — CBC
HCT: 40.9 % (ref 36.0–46.0)
Hemoglobin: 13.4 g/dL (ref 12.0–15.0)
MCH: 28 pg (ref 26.0–34.0)
MCHC: 32.8 g/dL (ref 30.0–36.0)
MCV: 85.4 fL (ref 78.0–100.0)
PLATELETS: 202 10*3/uL (ref 150–400)
RBC: 4.79 MIL/uL (ref 3.87–5.11)
RDW: 13.8 % (ref 11.5–15.5)
WBC: 11 10*3/uL — ABNORMAL HIGH (ref 4.0–10.5)

## 2016-06-12 LAB — I-STAT BETA HCG BLOOD, ED (MC, WL, AP ONLY)

## 2016-06-12 LAB — URINALYSIS, ROUTINE W REFLEX MICROSCOPIC
BILIRUBIN URINE: NEGATIVE
Glucose, UA: NEGATIVE mg/dL
HGB URINE DIPSTICK: NEGATIVE
Ketones, ur: NEGATIVE mg/dL
Leukocytes, UA: NEGATIVE
Nitrite: NEGATIVE
PROTEIN: NEGATIVE mg/dL
SPECIFIC GRAVITY, URINE: 1.01 (ref 1.005–1.030)
pH: 5 (ref 5.0–8.0)

## 2016-06-12 MED ORDER — FAMOTIDINE IN NACL 20-0.9 MG/50ML-% IV SOLN
20.0000 mg | Freq: Once | INTRAVENOUS | Status: AC
  Administered 2016-06-12: 20 mg via INTRAVENOUS
  Filled 2016-06-12: qty 50

## 2016-06-12 MED ORDER — ONDANSETRON HCL 4 MG/2ML IJ SOLN
4.0000 mg | Freq: Once | INTRAMUSCULAR | Status: AC
  Administered 2016-06-12: 4 mg via INTRAVENOUS
  Filled 2016-06-12: qty 2

## 2016-06-12 MED ORDER — ONDANSETRON HCL 4 MG PO TABS: 4.0000 mg | ORAL_TABLET | Freq: Three times a day (TID) | ORAL | 0 refills | Status: DC | PRN

## 2016-06-12 MED ORDER — SODIUM CHLORIDE 0.9 % IV BOLUS (SEPSIS)
1000.0000 mL | Freq: Once | INTRAVENOUS | Status: AC
  Administered 2016-06-12: 1000 mL via INTRAVENOUS

## 2016-06-12 MED ORDER — KETOROLAC TROMETHAMINE 15 MG/ML IJ SOLN
15.0000 mg | Freq: Once | INTRAMUSCULAR | Status: AC
  Administered 2016-06-12: 15 mg via INTRAVENOUS
  Filled 2016-06-12: qty 1

## 2016-06-12 MED ORDER — DICYCLOMINE HCL 20 MG PO TABS: 20.0000 mg | ORAL_TABLET | Freq: Three times a day (TID) | ORAL | 0 refills | Status: DC | PRN

## 2016-06-12 MED ORDER — IOPAMIDOL (ISOVUE-300) INJECTION 61%
INTRAVENOUS | Status: AC
  Administered 2016-06-12: 100 mL
  Filled 2016-06-12: qty 100

## 2016-06-12 MED ORDER — FENTANYL CITRATE (PF) 100 MCG/2ML IJ SOLN
50.0000 ug | Freq: Once | INTRAMUSCULAR | Status: AC
  Administered 2016-06-12: 50 ug via INTRAVENOUS
  Filled 2016-06-12: qty 2

## 2016-06-12 NOTE — ED Notes (Signed)
Pt is aware urine is needed. Pt states that she is unable to give a sample because she feels dehydrated.

## 2016-06-12 NOTE — ED Provider Notes (Signed)
[facility]-EMERGENCY DEPT Provider Note   CSN: [id number] Arrival date & time: 06/12/16  0758     History   Chief Complaint Chief Complaint  Patient presents with  . Abdominal Pain    HPI Norma [name] is a 45 y.o. female.  The history is provided by the patient. No language interpreter was used.  Abdominal Pain     Norma [name] is a 45 y.o. female who presents to the Emergency Department complaining of abdominal pain. At 3 AM she awoke with severe, epigastric abdominal pain. She has associated vomiting and diarrhea. No known sick contacts or bad food exposures. She had similar symptoms 3 weeks ago that were milder and resolved without intervention. It radiates to her back. No fevers. She has no medical problems and takes no medications. Past Medical History:  Diagnosis Date  . Anxiety   . ASCUS (atypical squamous cells of undetermined significance) on Pap smear 07   NEG HPV; BENIGN CHANGES 4/08  . Sleep apnea    C PAP  . 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    Patient Active Problem List   Diagnosis Date Noted  . RLS (restless legs syndrome) 12/31/2013  . Obesity (BMI 30-39.9) 12/03/2013  . OSA (obstructive sleep apnea) 12/03/2013  . Depression with anxiety 01/03/2013  . Back pain, thoracic 01/03/2013    Past Surgical History:  Procedure Laterality Date  . BILATERAL FUSION  [id number]   age 31--Foot  . INTRAUTERINE DEVICE INSERTION  07/2015   Mirena    OB History    Gravida Para Term Preterm AB Living   2 2       2    SAB TAB Ectopic Multiple Live Births                   Home Medications    Prior to Admission medications   Medication Sig Start Date End Date Taking? Authorizing Provider  CALCIUM PO Take by mouth. When remembers   Yes Historical Provider, MD  Cetirizine HCl (ZYRTEC PO) Take by mouth daily.    Yes Historical Provider, MD  Chlorpheniramine-PSE-Ibuprofen (ADVIL ALLERGY SINUS) 2-30-200 MG TABS 2-6 tabs daily   Yes  Historical Provider, MD  clonazePAM (KLONOPIN) 1 MG tablet Take 1 tablet (1 mg total) by mouth at bedtime. 12/02/15  Yes Sheliah HatchKatherine E Tabori, MD  cyclobenzaprine (FLEXERIL) 10 MG tablet Take 1 tablet (10 mg total) by mouth 3 (three) times daily as needed for muscle spasms. 12/06/14  Yes Sheliah HatchKatherine E Tabori, MD  fluticasone (FLONASE) 50 MCG/ACT nasal spray Place 2 sprays into both nostrils daily. Patient taking differently: Place 2 sprays into both nostrils as needed.  06/24/14  Yes Sheliah HatchKatherine E Tabori, MD  Multiple Vitamin (MULTIVITAMIN) tablet Take 1 tablet by mouth daily.   Yes Historical Provider, MD  venlafaxine XR (EFFEXOR-XR) 150 MG 24 hr capsule TAKE ONE CAPSULE BY MOUTH ONE TIME DAILY WITH BREAKFAST 01/19/16  Yes Sheliah HatchKatherine E Tabori, MD  dicyclomine (BENTYL) 20 MG tablet Take 1 tablet (20 mg total) by mouth every 8 (eight) hours as needed for spasms. 06/12/16   Tilden FossaElizabeth Wiletta Bermingham, MD  Lorcaserin HCl ER (BELVIQ XR) 20 MG TB24 Take 1 tablet by mouth daily. Patient not taking: Reported on 06/12/2016 10/22/15   Sheliah HatchKatherine E Tabori, MD  ondansetron (ZOFRAN) 4 MG tablet Take 1 tablet (4 mg total) by mouth every 8 (eight) hours as needed for nausea or vomiting. 06/12/16   Tilden FossaElizabeth Sayvion Vigen, MD  Family History Family History  Problem Relation Age of Onset  . Hypertension Maternal Grandfather   . Heart disease Maternal Grandfather   . Hypertension Paternal Grandmother   . Heart disease Paternal Grandmother   . Hypertension Paternal Grandfather   . Heart disease Paternal Grandfather   . Depression Father   . Heart failure Maternal Grandmother     Social History Social History  Substance Use Topics  . Smoking status: Never Smoker  . Smokeless tobacco: Never Used  . Alcohol use 0.0 oz/week     Comment: SOCIALLY     Allergies   Codeine and Depakote [divalproex sodium]   Review of Systems Review of Systems  Gastrointestinal: Positive for abdominal pain.  All other systems reviewed and are  negative.    Physical Exam Updated Vital Signs BP 113/55   Pulse 84   Temp 97.9 F (36.6 C) (Oral)   Resp 18   Ht 5\' 7"  (1.702 m)   Wt 250 lb (113.4 kg)   LMP 05/24/2016   SpO2 100%   BMI 39.16 kg/m   Physical Exam  Constitutional: She is oriented to person, place, and time. She appears well-developed and well-nourished.  HENT:  Head: Normocephalic and atraumatic.  Cardiovascular: Regular rhythm.   No murmur heard. Tachycardic  Pulmonary/Chest: Effort normal and breath sounds normal. No respiratory distress.  Abdominal: Soft. There is no rebound and no guarding.  Mild diffuse abdominal tenderness with significant tenderness in the right lower quadrant and periumbilical region with voluntary guarding.  Musculoskeletal: She exhibits no edema or tenderness.  Neurological: She is alert and oriented to person, place, and time.  Skin: Skin is warm and dry.  Psychiatric: She has a normal mood and affect. Her behavior is normal.  Nursing note and vitals reviewed.    ED Treatments / Results  Labs (all labs ordered are listed, but only abnormal results are displayed) Labs Reviewed  COMPREHENSIVE METABOLIC PANEL - Abnormal; Notable for the following:       Result Value   Glucose, Bld 103 (*)    All other components within normal limits  CBC - Abnormal; Notable for the following:    WBC 11.0 (*)    All other components within normal limits  LIPASE, BLOOD  URINALYSIS, ROUTINE W REFLEX MICROSCOPIC (NOT AT [facility])  I-STAT BETA HCG BLOOD, ED ([facility], [facility], [facility] ONLY)    EKG  EKG Interpretation None       Radiology Ct Abdomen Pelvis W Contrast  Result Date: 06/12/2016 CLINICAL DATA:  Sharp right upper quadrant pain since 3 a.m. this morning. Nausea started around 9 a.m. with vomiting. EXAM: CT ABDOMEN AND PELVIS WITH CONTRAST TECHNIQUE: Multidetector CT imaging of the abdomen and pelvis was performed using the standard protocol following bolus administration of intravenous  contrast. CONTRAST:  ISOVUE-300 IOPAMIDOL (ISOVUE-300) INJECTION 61% COMPARISON:  None. FINDINGS: Lower chest:  Unremarkable. Hepatobiliary: The liver shows diffusely decreased attenuation suggesting steatosis. Subcapsular areas of sparing are seen along the gallbladder fossa. There is no evidence for gallstones, gallbladder wall thickening, or pericholecystic fluid. No intrahepatic or extrahepatic biliary dilation. Pancreas: No focal mass lesion. No dilatation of the main duct. No intraparenchymal cyst. No peripancreatic edema. Spleen: No splenomegaly. No focal mass lesion. Adrenals/Urinary Tract: No adrenal nodule or mass. Kidneys are unremarkable. No evidence for hydroureter. The urinary bladder appears normal for the degree of distention. Stomach/Bowel: Stomach is nondistended. No gastric wall thickening. No evidence of outlet obstruction. Duodenum is normally positioned as is the  ligament of Treitz. No small bowel wall thickening. No small bowel dilatation. A short segment (1-2 cm) of jejunal enteroenteric intussusception is identified in the left upper quadrant (image 36 series 201 and sagittal image 81 series 204). There is no associated bowel obstruction, small bowel wall thickening, or perienteric edema/inflammation and this appearance is almost assuredly a transient process. The terminal ileum is normal. The appendix is normal. No gross colonic mass. No colonic wall thickening. No substantial diverticular change. Vascular/Lymphatic: No abdominal aortic aneurysm. No abdominal aortic atherosclerotic calcification. There is no gastrohepatic or hepatoduodenal ligament lymphadenopathy. No intraperitoneal or retroperitoneal lymphadenopathy. No pelvic sidewall lymphadenopathy. Reproductive: The uterus has normal CT imaging appearance. There is no adnexal mass. Other: No intraperitoneal free fluid. Musculoskeletal: Bone windows reveal no worrisome lytic or sclerotic osseous lesions. IMPRESSION: 1. No acute  findings. Specifically, no findings to explain the patient's history of right upper quadrant pain. 2. Very short segment jejunal enteroenteric intussusception without wall thickening, obstruction, or perienteric edema/inflammation. This is most likely a transient process. Electronically Signed   By: Kennith Center M.D.   On: 06/12/2016 12:25   US Abdomen Limited Ruq  Result Date: 06/12/2016 CLINICAL DATA:  Abdominal pain EXAM: US ABDOMEN LIMITED - RIGHT UPPER QUADRANT COMPARISON:  CT scan 06/12/2016. FINDINGS: Gallbladder: No gallstones or gallbladder wall thickening. No pericholecystic fluid. The sonographer reports no sonographic Murphy's sign. Common bile duct: Diameter: 2 mm Liver: Coarsening of the echotexture suggests fatty deposition. IMPRESSION: Steatosis.  Otherwise unremarkable. Electronically Signed   By: Kennith Center M.D.   On: 06/12/2016 16:18    Procedures Procedures (including critical care time)  Medications Ordered in ED Medications  fentaNYL (SUBLIMAZE) injection 50 mcg (50 mcg Intravenous Given 06/12/16 0940)  ondansetron (ZOFRAN) injection 4 mg (4 mg Intravenous Given 06/12/16 0940)  famotidine (PEPCID) IVPB 20 mg premix (0 mg Intravenous Stopped 06/12/16 1054)  iopamidol (ISOVUE-300) 61 % injection (100 mLs  Contrast Given 06/12/16 1209)  ondansetron (ZOFRAN) injection 4 mg (4 mg Intravenous Given 06/12/16 1059)  sodium chloride 0.9 % bolus 1,000 mL (0 mLs Intravenous Stopped 06/12/16 1531)  ketorolac (TORADOL) 15 MG/ML injection 15 mg (15 mg Intravenous Given 06/12/16 1809)     Initial Impression / Assessment and Plan / ED Course  I have reviewed the triage vital signs and the nursing notes.  Pertinent labs & imaging results that were available during my care of the patient were reviewed by me and considered in my medical decision making (see chart for details).  Clinical Course    Patient here for evaluation of abdominal pain, nausea, vomiting, diarrhea. Patient  was initial diffuse abdominal tenderness is greatest in the right lower quadrant. CT abdomen obtained with no acute abnormalities. On repeat assessment she has more right hemi-abdomen tenderness. Upper quadrant ultrasound obtained of this negative for acute gallbladder pathology. Patient is improved on reassessment in the emergency department. DC home with Bentyl as needed for abdominal pain, Zofran as needed for nausea. Discussed close outpatient follow-up and return precautions.  Final Clinical Impressions(s) / ED Diagnoses   Final diagnoses:  Abdominal pain  Nausea vomiting and diarrhea  Generalized abdominal pain    New Prescriptions Discharge Medication List as of 06/12/2016  5:34 PM    START taking these medications   Details  dicyclomine (BENTYL) 20 MG tablet Take 1 tablet (20 mg total) by mouth every 8 (eight) hours as needed for spasms., Starting Sat 06/12/2016, Print    ondansetron (ZOFRAN) 4 MG tablet Take 1  tablet (4 mg total) by mouth every 8 (eight) hours as needed for nausea or vomiting., Starting Sat 06/12/2016, Print         Tilden Fossa, MD 06/13/16 [phone]

## 2016-06-12 NOTE — ED Triage Notes (Signed)
Pt states she woke up this morning at 3am with sharp right upper abd pain. Pt concerned for gallstones. Pain goes straight through to back. Pt states she is nauseated and feels like her "stomach is being twisted." Pt also complaining of having some diarrhea

## 2016-06-12 NOTE — ED Notes (Signed)
Patient transported to CT 

## 2016-06-12 NOTE — ED Notes (Signed)
Pt states she is unable to go to restroom.

## 2016-06-13 ENCOUNTER — Other Ambulatory Visit: Payer: Self-pay | Admitting: Family Medicine

## 2016-06-16 ENCOUNTER — Encounter: Payer: BLUE CROSS/BLUE SHIELD | Admitting: Family Medicine

## 2016-06-18 ENCOUNTER — Ambulatory Visit (INDEPENDENT_AMBULATORY_CARE_PROVIDER_SITE_OTHER): Payer: BLUE CROSS/BLUE SHIELD | Admitting: Family Medicine

## 2016-06-18 ENCOUNTER — Encounter: Payer: Self-pay | Admitting: General Practice

## 2016-06-18 ENCOUNTER — Encounter: Payer: Self-pay | Admitting: Family Medicine

## 2016-06-18 VITALS — BP 118/72 | HR 76 | Temp 97.9°F | Resp 16 | Ht 67.0 in | Wt 254.2 lb

## 2016-06-18 DIAGNOSIS — R1011 Right upper quadrant pain: Secondary | ICD-10-CM

## 2016-06-18 DIAGNOSIS — R1012 Left upper quadrant pain: Secondary | ICD-10-CM | POA: Diagnosis not present

## 2016-06-18 LAB — CBC WITH DIFFERENTIAL/PLATELET
BASOS ABS: 0 10*3/uL (ref 0.0–0.1)
Basophils Relative: 0.5 % (ref 0.0–3.0)
EOS PCT: 2.7 % (ref 0.0–5.0)
Eosinophils Absolute: 0.2 10*3/uL (ref 0.0–0.7)
HCT: 37.5 % (ref 36.0–46.0)
HEMOGLOBIN: 12.5 g/dL (ref 12.0–15.0)
LYMPHS ABS: 2 10*3/uL (ref 0.7–4.0)
Lymphocytes Relative: 31.9 % (ref 12.0–46.0)
MCHC: 33.3 g/dL (ref 30.0–36.0)
MCV: 84.6 fl (ref 78.0–100.0)
MONO ABS: 0.3 10*3/uL (ref 0.1–1.0)
MONOS PCT: 4.8 % (ref 3.0–12.0)
NEUTROS PCT: 60.1 % (ref 43.0–77.0)
Neutro Abs: 3.8 10*3/uL (ref 1.4–7.7)
Platelets: 219 10*3/uL (ref 150.0–400.0)
RBC: 4.44 Mil/uL (ref 3.87–5.11)
RDW: 13.8 % (ref 11.5–15.5)
WBC: 6.3 10*3/uL (ref 4.0–10.5)

## 2016-06-18 LAB — HEPATIC FUNCTION PANEL
ALBUMIN: 4.5 g/dL (ref 3.5–5.2)
ALK PHOS: 95 U/L (ref 39–117)
ALT: 30 U/L (ref 0–35)
AST: 22 U/L (ref 0–37)
Bilirubin, Direct: 0.1 mg/dL (ref 0.0–0.3)
TOTAL PROTEIN: 7.1 g/dL (ref 6.0–8.3)
Total Bilirubin: 0.4 mg/dL (ref 0.2–1.2)

## 2016-06-18 LAB — BASIC METABOLIC PANEL
BUN: 17 mg/dL (ref 6–23)
CALCIUM: 9.2 mg/dL (ref 8.4–10.5)
CO2: 30 meq/L (ref 19–32)
Chloride: 103 mEq/L (ref 96–112)
Creatinine, Ser: 0.8 mg/dL (ref 0.40–1.20)
GFR: 83.13 mL/min (ref >60.00)
GLUCOSE: 102 mg/dL — AB (ref 70–99)
Potassium: 3.8 mEq/L (ref 3.5–5.1)
SODIUM: 140 meq/L (ref 135–145)

## 2016-06-18 LAB — LIPASE: LIPASE: 12 U/L (ref 11.0–59.0)

## 2016-06-18 NOTE — Patient Instructions (Signed)
Follow up as needed We'll contact you with your Surgery appt Start the Dicyclomine (Bentyl) prior to eating to avoid cramping/spasm Drink plenty of fluids Eat a low fat diet Call with any questions or concerns Hang in there!!!

## 2016-06-18 NOTE — Progress Notes (Signed)
Pre visit review using our clinic review tool, if applicable. No additional management support is needed unless otherwise documented below in the visit note. 

## 2016-06-18 NOTE — Progress Notes (Signed)
   Subjective:    Patient ID: Norma [name], femalAlyson Inglese    DOB: 06/17/1971, 45 y.o.   MRN: [id number]  HPI ED f/u- pt went to ER on 10/14 w/ abdominal pain.  Had severe pain, N/V/diarrhea.  Had abd US and CT-  'A short segment (1-2 cm) of jejunal enteroenteric intussusception is identified in the left upper quadrant (image 36 series 201 and sagittal image 81 series 204). There is no associated bowel obstruction, small bowel wall thickening, or perienteric edema/inflammation and this appearance is almost assuredly a transient process.'  Pt reports she is having 'attacks where it feels like someone is sticking a hot metal rod through me'.  Worse w/ fatty or greasy foods.  Pain will double her over.  Pt will have pain radiating to her shoulder blades.  Pt had normal LFTs and Lipase in ER.  Pt does have mildly elevated WBC.  Pt already on Prilosec 20mg  BID.   Review of Systems For ROS see HPI     Objective:   Physical Exam  Constitutional: She is oriented to person, place, and time. She appears well-developed and well-nourished. No distress.  HENT:  Head: Normocephalic and atraumatic.  Cardiovascular: Normal rate, regular rhythm and normal heart sounds.   Pulmonary/Chest: Effort normal and breath sounds normal. No respiratory distress. She has no wheezes. She has no rales.  Abdominal: Soft. Bowel sounds are normal. She exhibits no distension and no mass. There is tenderness (TTP over epigastrum, LUQ). There is guarding (voluntary guarding over epigastrum and LUQ). There is no rebound.  Neurological: She is alert and oriented to person, place, and time.  Skin: Skin is warm and dry.  Psychiatric: She has a normal mood and affect. Her behavior is normal. Thought content normal.  Vitals reviewed.         Assessment & Plan:  abd pain- pt has epigastric, LUQ, and RLQ TTP.  Pt reports RLQ TTP is always present but her epigastric and LUQ pain is new and severe.  Pt had another 'attack' on  Wednesday and remains TTP today.  CT scan showed jejunal enteroenteric intussusception that was thought to be transient but her continued pain is concerning.  Will refer urgently to General Surgery for evaluation and possible tx.  Pt expressed understanding and is in agreement w/ plan.

## 2016-06-25 ENCOUNTER — Other Ambulatory Visit (HOSPITAL_COMMUNITY): Payer: Self-pay | Admitting: General Surgery

## 2016-06-25 DIAGNOSIS — R1013 Epigastric pain: Secondary | ICD-10-CM

## 2016-07-14 ENCOUNTER — Ambulatory Visit (HOSPITAL_COMMUNITY)
Admission: RE | Admit: 2016-07-14 | Discharge: 2016-07-14 | Disposition: A | Discharge status: Home / Self Care | Payer: BLUE CROSS/BLUE SHIELD | Source: Ambulatory Visit | Attending: General Surgery | Admitting: General Surgery

## 2016-07-14 DIAGNOSIS — R1013 Epigastric pain: Secondary | ICD-10-CM | POA: Diagnosis present

## 2016-07-14 DIAGNOSIS — R932 Abnormal findings on diagnostic imaging of liver and biliary tract: Secondary | ICD-10-CM | POA: Insufficient documentation

## 2016-07-14 MED ORDER — TECHNETIUM TC 99M MEBROFENIN IV KIT
5.0000 | PACK | Freq: Once | INTRAVENOUS | Status: AC | PRN
  Administered 2016-07-14: 5 via INTRAVENOUS

## 2016-07-20 ENCOUNTER — Other Ambulatory Visit: Payer: Self-pay | Admitting: Family Medicine

## 2016-07-20 NOTE — Telephone Encounter (Signed)
Last OV 06/18/16 (abd pain) Clonazepam last filled 12/02/15 #30 with 3

## 2016-07-20 NOTE — Telephone Encounter (Signed)
Medication filled to pharmacy as requested.   

## 2016-07-28 ENCOUNTER — Ambulatory Visit: Payer: Self-pay | Admitting: General Surgery

## 2016-07-28 NOTE — H&P (Signed)
Norma [name] 07/28/2016 11:56 AM Location: [facility] Patient #: [id number] DOB: 08/18/1971 Married / Language: [place] / Race: White Female  History of Present Illness Adolph Pollack(Duffy Dantonio J. Emanual Lamountain MD; 07/28/2016 12:17 PM) The patient is a 45 year old female.   Note:She presents today to discuss her radiologic findings. Ultrasound did not demonstrate any gallstones. However, nuclear medicine hepatobiliary imaging demonstrates a low gallbladder ejection fraction (10%). This is consistent with biliary dyskinesia which is more concordant with her symptoms versus transient jejunal intussusception. Her husband is here with her. She continues to have the nausea.  Allergies Timmothy Euler(Sade [place], [place]; 07/28/2016 11:57 AM) Codeine and Related Hives. No Known Drug Allergies 07/28/2016 (Marked as Inactive) Depakote *ANTICONVULSANTS* Hives.  Medication History Timmothy Euler(Sade Bradford, [place]; 07/28/2016 11:59 AM) Calcium Gluconate (daily) Active. Advil Allergy Sinus (2-30-200MG  Tablet, Oral as needed) Active. ClonazePAM (1MG  Tablet, Oral daily) Active. Flexeril (10MG  Tablet, Oral daily) Active. Flonase (50MCG/ACT Suspension, Nasal as needed) Active. Multi-Minerals (Oral daily) Active. Venlafaxine HCl ER (150MG  Capsule ER 24HR, Oral daily) Active. Dicyclomine HCl (10MG  Capsule, Oral as needed) Active. Medications Reconciled    Vitals Barron Alvine(Sade Bradford CMA; 07/28/2016 11:59 AM) 07/28/2016 11:59 AM Weight: 255.2 lb Height: 67in Body Surface Area: 2.24 m Body Mass Index: 39.97 kg/m  Temp.: 98.48F  Pulse: 87 (Regular)  BP: 124/82 (Sitting, Left Arm, Standard)      Physical Exam Adolph Pollack(Anjanette Gilkey J. Roran Wegner MD; 07/28/2016 12:18 PM)  The physical exam findings are as follows: Note:General-obese female no acute distress.  Eyes-extraocular motions intact, no icterus  Abd-Soft, obese, nontender, no mass, no hernia.    Assessment & Plan Adolph Pollack(Asha Grumbine J. 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  Plan: I recommended laparoscopic cholecystectomy with cholangiogram. We will also look at the small bowel and if there is an intussusception and the mass, we discussed doing a limited small bowel resection at the same time. I have explained the procedure, risks, and aftercare of cholecystectomy. Risks include but are not limited to bleeding, infection, wound problems, anesthesia, diarrhea, bile leak, injury to common bile duct/liver/intestine, anastomotic leak (if small bowel resection done). She and her husband seem to understand and would like to proceed. We will start the scheduling process.  Avel Peaceodd Zori Benbrook, M.D.

## 2016-08-06 NOTE — Patient Instructions (Addendum)
Alyson InglesDean Marrone-Edwards  08/06/2016   Your procedure is scheduled on: 08-13-16  [facility] 3rd floor to  Short Stay Center at 530 AM.  Call this number if you have problems the morning of surgery [phone]   Remember: ONLY 1 PERSON MAY GO WITH YOU TO SHORT STAY TO GET  READY MORNING OF YOUR SURGERY.  Do not eat food or drink liquids :After Midnight.  Bring cpap mask and tubing   Take these medicines the morning of surgery with A SIP OF WATER:  VENLAFAXINE XR (EFFEXOR XR), OMEPRAZOLE (PRILOSEC)                               You may not have any metal on your body including hair pins and              piercings  Do not wear jewelry, make-up, lotions, powders or perfumes, deodorant             Do not wear nail polish.  Do not shave  48 hours prior to surgery.              Men may shave face and neck.   Do not bring valuables to the hospital. [facility] IS NOT             RESPONSIBLE   FOR VALUABLES.  Contacts, dentures or bridgework may not be worn into surgery.  Leave suitcase in the car. After surgery it may be brought to your room.     Patients discharged the day of surgery will not be allowed to drive home.  Name and phone number of your driver:NATHAN SPOUSE CELL [phone]  Special Instructions: N/A              Please read over the following fact sheets you were given: _____________________________________________________________________             [facility] Pre[facility] Before surgery, you can play an important role.  Because skin is not sterile, your skin needs to be as free of germs as possible.  You can reduce the number of germs on your skin by washing with CHG (chlorahexidine gluconate) soap before surgery.  CHG is an antiseptic cleaner which kills germs and bonds with the skin to continue killing germs even after washing. Please DO NOT use if you have an allergy to CHG or  antibacterial soaps.  If your skin becomes reddened/irritated stop using the CHG and inform your nurse when you arrive at Short Stay. Do not shave (including legs and underarms) for at least 48 hours prior to the first CHG shower.  You may shave your face/neck. Please follow these instructions carefully:  1.  Shower with CHG Soap the night before surgery and the  morning of Surgery.  2.  If you choose to wash your hair, wash your hair first as usual with your  normal  shampoo.  3.  After you shampoo, rinse your hair and body thoroughly to remove the  shampoo.                           4.  Use CHG as you would any other liquid soap.  You can apply chg directly  to the skin and wash  Gently with a scrungie or clean washcloth.  5.  Apply the CHG Soap to your body ONLY FROM THE NECK DOWN.   Do not use on face/ open                           Wound or open sores. Avoid contact with eyes, ears mouth and genitals (private parts).                       Wash face,  Genitals (private parts) with your normal soap.             6.  Wash thoroughly, paying special attention to the area where your surgery  will be performed.  7.  Thoroughly rinse your body with warm water from the neck down.  8.  DO NOT shower/wash with your normal soap after using and rinsing off  the CHG Soap.                9.  Pat yourself dry with a clean towel.            10.  Wear clean pajamas.            11.  Place clean sheets on your bed the night of your first shower and do not  sleep with pets. Day of Surgery : Do not apply any lotions/deodorants the morning of surgery.  Please wear clean clothes to the hospital/surgery center.  FAILURE TO FOLLOW THESE INSTRUCTIONS MAY RESULT IN THE CANCELLATION OF YOUR SURGERY PATIENT SIGNATURE_________________________________  NURSE SIGNATURE__________________________________  ________________________________________________________________________

## 2016-08-10 ENCOUNTER — Encounter (HOSPITAL_COMMUNITY): Payer: Self-pay

## 2016-08-10 ENCOUNTER — Encounter (HOSPITAL_COMMUNITY)
Admission: RE | Admit: 2016-08-10 | Discharge: 2016-08-10 | Disposition: A | Discharge status: Home / Self Care | Payer: BLUE CROSS/BLUE SHIELD | Source: Ambulatory Visit | Attending: General Surgery | Admitting: General Surgery

## 2016-08-10 DIAGNOSIS — K219 Gastro-esophageal reflux disease without esophagitis: Secondary | ICD-10-CM | POA: Diagnosis not present

## 2016-08-10 DIAGNOSIS — K828 Other specified diseases of gallbladder: Secondary | ICD-10-CM | POA: Diagnosis present

## 2016-08-10 DIAGNOSIS — K811 Chronic cholecystitis: Secondary | ICD-10-CM | POA: Diagnosis not present

## 2016-08-10 DIAGNOSIS — Z888 Allergy status to other drugs, medicaments and biological substances status: Secondary | ICD-10-CM | POA: Diagnosis not present

## 2016-08-10 DIAGNOSIS — Z01812 Encounter for preprocedural laboratory examination: Secondary | ICD-10-CM | POA: Insufficient documentation

## 2016-08-10 DIAGNOSIS — G4730 Sleep apnea, unspecified: Secondary | ICD-10-CM | POA: Diagnosis not present

## 2016-08-10 DIAGNOSIS — Z6839 Body mass index (BMI) 39.0-39.9, adult: Secondary | ICD-10-CM | POA: Diagnosis not present

## 2016-08-10 DIAGNOSIS — F40240 Claustrophobia: Secondary | ICD-10-CM | POA: Diagnosis not present

## 2016-08-10 DIAGNOSIS — K561 Intussusception: Secondary | ICD-10-CM

## 2016-08-10 DIAGNOSIS — Z885 Allergy status to narcotic agent status: Secondary | ICD-10-CM | POA: Diagnosis not present

## 2016-08-10 HISTORY — DX: Headache, unspecified: R51.9

## 2016-08-10 HISTORY — DX: Gastro-esophageal reflux disease without esophagitis: K21.9

## 2016-08-10 HISTORY — DX: Claustrophobia: F40.240

## 2016-08-10 HISTORY — DX: Other chronic pain: G89.29

## 2016-08-10 HISTORY — DX: Headache: R51

## 2016-08-10 HISTORY — DX: Family history of other specified conditions: Z84.89

## 2016-08-10 HISTORY — DX: Dorsalgia, unspecified: M54.9

## 2016-08-10 LAB — CBC WITH DIFFERENTIAL/PLATELET
Basophils Absolute: 0 10*3/uL (ref 0.0–0.1)
Basophils Relative: 0 %
Eosinophils Absolute: 0.1 10*3/uL (ref 0.0–0.7)
Eosinophils Relative: 2 %
HEMATOCRIT: 40 % (ref 36.0–46.0)
HEMOGLOBIN: 12.8 g/dL (ref 12.0–15.0)
LYMPHS ABS: 1.9 10*3/uL (ref 0.7–4.0)
LYMPHS PCT: 31 %
MCH: 27.6 pg (ref 26.0–34.0)
MCHC: 32 g/dL (ref 30.0–36.0)
MCV: 86.2 fL (ref 78.0–100.0)
MONOS PCT: 4 %
Monocytes Absolute: 0.3 10*3/uL (ref 0.1–1.0)
NEUTROS ABS: 3.9 10*3/uL (ref 1.7–7.7)
NEUTROS PCT: 63 %
Platelets: 203 10*3/uL (ref 150–400)
RBC: 4.64 MIL/uL (ref 3.87–5.11)
RDW: 13.8 % (ref 11.5–15.5)
WBC: 6.3 10*3/uL (ref 4.0–10.5)

## 2016-08-10 LAB — COMPREHENSIVE METABOLIC PANEL
ALK PHOS: 87 U/L (ref 38–126)
ALT: 26 U/L (ref 14–54)
ANION GAP: 8 (ref 5–15)
AST: 19 U/L (ref 15–41)
Albumin: 4.3 g/dL (ref 3.5–5.0)
BILIRUBIN TOTAL: 0.6 mg/dL (ref 0.3–1.2)
BUN: 18 mg/dL (ref 6–20)
CALCIUM: 9 mg/dL (ref 8.9–10.3)
CO2: 28 mmol/L (ref 22–32)
CREATININE: 0.79 mg/dL (ref 0.44–1.00)
Chloride: 103 mmol/L (ref 101–111)
GFR calc non Af Amer: >60 mL/min (ref >60)
GLUCOSE: 104 mg/dL — AB (ref 65–99)
Potassium: 4.4 mmol/L (ref 3.5–5.1)
Sodium: 139 mmol/L (ref 135–145)
TOTAL PROTEIN: 7.8 g/dL (ref 6.5–8.1)

## 2016-08-10 LAB — HCG, SERUM, QUALITATIVE: Preg, Serum: NEGATIVE

## 2016-08-10 LAB — ABO/RH: ABO/RH(D): O POS

## 2016-08-12 ENCOUNTER — Encounter (HOSPITAL_COMMUNITY): Payer: Self-pay | Admitting: Anesthesiology

## 2016-08-12 NOTE — Anesthesia Preprocedure Evaluation (Addendum)
Anesthesia Evaluation  Patient identified by MRN, date of birth, ID band Patient awake    Reviewed: Allergy & Precautions, NPO status , Patient's Chart, lab work & pertinent test results  History of Anesthesia Complications (+) Family history of anesthesia reaction  Airway Mallampati: II  TM Distance: >3 FB Neck ROM: Full    Dental no notable dental hx. (+) Teeth Intact, Caps   Pulmonary sleep apnea and Continuous Positive Airway Pressure Ventilation ,    Pulmonary exam normal breath sounds clear to auscultation       Cardiovascular negative cardio ROS Normal cardiovascular exam Rhythm:Regular Rate:Normal     Neuro/Psych  Headaches, PSYCHIATRIC DISORDERS Anxiety Claustrophobia   GI/Hepatic Neg liver ROS, GERD  Medicated and Controlled,Transient Jejunal intussusception Biliary Dyskinesis   Endo/Other  Morbid obesity  Renal/GU negative Renal ROS  negative genitourinary   Musculoskeletal Chronic back pain   Abdominal (+) + obese,   Peds  Hematology negative hematology ROS (+)   Anesthesia Other Findings   Reproductive/Obstetrics                            Lab Results  Component Value Date   WBC 6.3 08/10/2016   HGB 12.8 08/10/2016   HCT 40.0 08/10/2016   MCV 86.2 08/10/2016   PLT 203 08/10/2016     Chemistry      Component Value Date/Time   NA 139 08/10/2016 0928   K 4.4 08/10/2016 0928   CL 103 08/10/2016 0928   CO2 28 08/10/2016 0928   BUN 18 08/10/2016 0928   CREATININE 0.79 08/10/2016 0928      Component Value Date/Time   CALCIUM 9.0 08/10/2016 0928   ALKPHOS 87 08/10/2016 0928   AST 19 08/10/2016 0928   ALT 26 08/10/2016 0928   BILITOT 0.6 08/10/2016 0928      Anesthesia Physical Anesthesia Plan  ASA: III  Anesthesia Plan: General   Post-op Pain Management:    Induction: Intravenous  Airway Management Planned: Oral ETT  Additional Equipment:   Intra-op  Plan:   Post-operative Plan: Extubation in OR  Informed Consent: I have reviewed the patients History and Physical, chart, labs and discussed the procedure including the risks, benefits and alternatives for the proposed anesthesia with the patient or authorized representative who has indicated his/her understanding and acceptance.   Dental advisory given  Plan Discussed with:   Anesthesia Plan Comments:         Anesthesia Quick Evaluation

## 2016-08-13 ENCOUNTER — Encounter (HOSPITAL_COMMUNITY)
Admission: RE | Disposition: A | Discharge status: Home / Self Care | Payer: Self-pay | Source: Ambulatory Visit | Attending: General Surgery

## 2016-08-13 ENCOUNTER — Ambulatory Visit (HOSPITAL_COMMUNITY): Payer: BLUE CROSS/BLUE SHIELD

## 2016-08-13 ENCOUNTER — Ambulatory Visit (HOSPITAL_COMMUNITY): Payer: BLUE CROSS/BLUE SHIELD | Admitting: Anesthesiology

## 2016-08-13 ENCOUNTER — Encounter (HOSPITAL_COMMUNITY): Payer: Self-pay | Admitting: *Deleted

## 2016-08-13 ENCOUNTER — Ambulatory Visit (HOSPITAL_COMMUNITY)
Admission: RE | Admit: 2016-08-13 | Discharge: 2016-08-13 | Disposition: A | Discharge status: Home / Self Care | Payer: BLUE CROSS/BLUE SHIELD | Source: Ambulatory Visit | Attending: General Surgery | Admitting: General Surgery

## 2016-08-13 DIAGNOSIS — K811 Chronic cholecystitis: Secondary | ICD-10-CM | POA: Insufficient documentation

## 2016-08-13 DIAGNOSIS — Z885 Allergy status to narcotic agent status: Secondary | ICD-10-CM | POA: Insufficient documentation

## 2016-08-13 DIAGNOSIS — Z888 Allergy status to other drugs, medicaments and biological substances status: Secondary | ICD-10-CM | POA: Insufficient documentation

## 2016-08-13 DIAGNOSIS — Z6839 Body mass index (BMI) 39.0-39.9, adult: Secondary | ICD-10-CM | POA: Insufficient documentation

## 2016-08-13 DIAGNOSIS — Z419 Encounter for procedure for purposes other than remedying health state, unspecified: Secondary | ICD-10-CM

## 2016-08-13 DIAGNOSIS — F40240 Claustrophobia: Secondary | ICD-10-CM | POA: Insufficient documentation

## 2016-08-13 DIAGNOSIS — K828 Other specified diseases of gallbladder: Secondary | ICD-10-CM | POA: Insufficient documentation

## 2016-08-13 DIAGNOSIS — G4730 Sleep apnea, unspecified: Secondary | ICD-10-CM | POA: Insufficient documentation

## 2016-08-13 DIAGNOSIS — K219 Gastro-esophageal reflux disease without esophagitis: Secondary | ICD-10-CM | POA: Insufficient documentation

## 2016-08-13 HISTORY — PX: LAPAROSCOPIC ABDOMINAL EXPLORATION: SHX6249

## 2016-08-13 HISTORY — PX: CHOLECYSTECTOMY: SHX55

## 2016-08-13 LAB — TYPE AND SCREEN
ABO/RH(D): O POS
ANTIBODY SCREEN: NEGATIVE

## 2016-08-13 SURGERY — LAPAROSCOPIC CHOLECYSTECTOMY WITH INTRAOPERATIVE CHOLANGIOGRAM
Anesthesia: General

## 2016-08-13 MED ORDER — LIDOCAINE 2% (20 MG/ML) 5 ML SYRINGE
INTRAMUSCULAR | Status: DC | PRN
  Administered 2016-08-13: 80 mg via INTRAVENOUS

## 2016-08-13 MED ORDER — CEFAZOLIN SODIUM-DEXTROSE 2-4 GM/100ML-% IV SOLN
INTRAVENOUS | Status: AC
  Filled 2016-08-13: qty 100

## 2016-08-13 MED ORDER — ROCURONIUM BROMIDE 50 MG/5ML IV SOSY
PREFILLED_SYRINGE | INTRAVENOUS | Status: AC
  Filled 2016-08-13: qty 5

## 2016-08-13 MED ORDER — SUGAMMADEX SODIUM 500 MG/5ML IV SOLN
INTRAVENOUS | Status: AC
  Filled 2016-08-13: qty 5

## 2016-08-13 MED ORDER — MEPERIDINE HCL 50 MG/ML IJ SOLN: 6.2500 mg | INTRAMUSCULAR | Status: DC | PRN

## 2016-08-13 MED ORDER — IOPAMIDOL (ISOVUE-300) INJECTION 61%
INTRAVENOUS | Status: DC | PRN
  Administered 2016-08-13: 2 mL

## 2016-08-13 MED ORDER — MIDAZOLAM HCL 5 MG/5ML IJ SOLN
INTRAMUSCULAR | Status: DC | PRN
  Administered 2016-08-13: 2 mg via INTRAVENOUS

## 2016-08-13 MED ORDER — METOCLOPRAMIDE HCL 5 MG/ML IJ SOLN
INTRAMUSCULAR | Status: AC
  Filled 2016-08-13: qty 2

## 2016-08-13 MED ORDER — DEXAMETHASONE SODIUM PHOSPHATE 10 MG/ML IJ SOLN
INTRAMUSCULAR | Status: DC | PRN
  Administered 2016-08-13: 10 mg via INTRAVENOUS

## 2016-08-13 MED ORDER — LACTATED RINGERS IV SOLN
INTRAVENOUS | Status: DC | PRN
  Administered 2016-08-13 (×2): via INTRAVENOUS

## 2016-08-13 MED ORDER — PROPOFOL 10 MG/ML IV BOLUS
INTRAVENOUS | Status: DC | PRN
  Administered 2016-08-13: 200 mg via INTRAVENOUS

## 2016-08-13 MED ORDER — CEFAZOLIN SODIUM-DEXTROSE 2-4 GM/100ML-% IV SOLN
2.0000 g | INTRAVENOUS | Status: AC
  Administered 2016-08-13: 2 g via INTRAVENOUS

## 2016-08-13 MED ORDER — OXYCODONE HCL 5 MG PO TABS: 5.0000 mg | ORAL_TABLET | ORAL | 0 refills | Status: DC | PRN

## 2016-08-13 MED ORDER — BUPIVACAINE HCL 0.5 % IJ SOLN
INTRAMUSCULAR | Status: DC | PRN
  Administered 2016-08-13: 10 mL

## 2016-08-13 MED ORDER — FENTANYL CITRATE (PF) 100 MCG/2ML IJ SOLN
INTRAMUSCULAR | Status: DC | PRN
  Administered 2016-08-13: 50 ug via INTRAVENOUS
  Administered 2016-08-13: 150 ug via INTRAVENOUS

## 2016-08-13 MED ORDER — SCOPOLAMINE 1 MG/3DAYS TD PT72
MEDICATED_PATCH | TRANSDERMAL | Status: AC
  Filled 2016-08-13: qty 1

## 2016-08-13 MED ORDER — MIDAZOLAM HCL 2 MG/2ML IJ SOLN
INTRAMUSCULAR | Status: AC
  Filled 2016-08-13: qty 2

## 2016-08-13 MED ORDER — SUGAMMADEX SODIUM 200 MG/2ML IV SOLN
INTRAVENOUS | Status: DC | PRN
  Administered 2016-08-13: 400 mg via INTRAVENOUS

## 2016-08-13 MED ORDER — FENTANYL CITRATE (PF) 100 MCG/2ML IJ SOLN
INTRAMUSCULAR | Status: AC
  Filled 2016-08-13: qty 4

## 2016-08-13 MED ORDER — BUPIVACAINE HCL (PF) 0.5 % IJ SOLN
INTRAMUSCULAR | Status: AC
  Filled 2016-08-13: qty 30

## 2016-08-13 MED ORDER — DEXAMETHASONE SODIUM PHOSPHATE 10 MG/ML IJ SOLN
INTRAMUSCULAR | Status: AC
  Filled 2016-08-13: qty 1

## 2016-08-13 MED ORDER — IOPAMIDOL (ISOVUE-300) INJECTION 61%
INTRAVENOUS | Status: AC
  Filled 2016-08-13: qty 50

## 2016-08-13 MED ORDER — PROMETHAZINE HCL 25 MG/ML IJ SOLN
INTRAMUSCULAR | Status: AC
  Filled 2016-08-13: qty 1

## 2016-08-13 MED ORDER — LACTATED RINGERS IR SOLN
Status: DC | PRN
  Administered 2016-08-13: 1000 mL

## 2016-08-13 MED ORDER — ONDANSETRON HCL 4 MG PO TABS: 4.0000 mg | ORAL_TABLET | ORAL | 0 refills | Status: DC | PRN

## 2016-08-13 MED ORDER — LIDOCAINE 2% (20 MG/ML) 5 ML SYRINGE
INTRAMUSCULAR | Status: AC
  Filled 2016-08-13: qty 5

## 2016-08-13 MED ORDER — METOCLOPRAMIDE HCL 5 MG/ML IJ SOLN
10.0000 mg | Freq: Once | INTRAMUSCULAR | Status: AC | PRN
  Administered 2016-08-13: 10 mg via INTRAVENOUS

## 2016-08-13 MED ORDER — HYDROMORPHONE HCL 2 MG/ML IJ SOLN
0.2500 mg | INTRAMUSCULAR | Status: DC | PRN
  Administered 2016-08-13: 0.5 mg via INTRAVENOUS

## 2016-08-13 MED ORDER — PROPOFOL 10 MG/ML IV BOLUS
INTRAVENOUS | Status: AC
  Filled 2016-08-13: qty 40

## 2016-08-13 MED ORDER — ROCURONIUM BROMIDE 10 MG/ML (PF) SYRINGE
PREFILLED_SYRINGE | INTRAVENOUS | Status: DC | PRN
  Administered 2016-08-13: 50 mg via INTRAVENOUS
  Administered 2016-08-13: 20 mg via INTRAVENOUS

## 2016-08-13 MED ORDER — ONDANSETRON HCL 4 MG/2ML IJ SOLN
INTRAMUSCULAR | Status: AC
  Filled 2016-08-13: qty 2

## 2016-08-13 MED ORDER — METHOCARBAMOL 1000 MG/10ML IJ SOLN
500.0000 mg | Freq: Once | INTRAVENOUS | Status: DC
  Filled 2016-08-13: qty 5

## 2016-08-13 MED ORDER — SCOPOLAMINE 1 MG/3DAYS TD PT72SCOPOLAMINE 1 MG/3DAYS
MEDICATED_PATCH | TRANSDERMAL | Status: DC | PRN
Start: 2016-08-13 — End: 2016-08-13
  Administered 2016-08-13: 1 via TRANSDERMAL

## 2016-08-13 MED ORDER — PROMETHAZINE HCL 25 MG/ML IJ SOLN
6.2500 mg | INTRAMUSCULAR | Status: DC | PRN
  Administered 2016-08-13: 6.25 mg via INTRAVENOUS

## 2016-08-13 MED ORDER — HYDROMORPHONE HCL 2 MG/ML IJ SOLN
INTRAMUSCULAR | Status: AC
  Filled 2016-08-13: qty 1

## 2016-08-13 MED ORDER — ONDANSETRON HCL 4 MG/2ML IJ SOLN
INTRAMUSCULAR | Status: DC | PRN
  Administered 2016-08-13: 4 mg via INTRAVENOUS

## 2016-08-13 SURGICAL SUPPLY — 44 items
APL SKNCLS STERI-STRIP NONHPOA (GAUZE/BANDAGES/DRESSINGS) ×2
APPLIER CLIP 5 13 M/L LIGAMAX5 (MISCELLANEOUS) ×3
APR CLP MED LRG 5 ANG JAW (MISCELLANEOUS) ×2
BAG SPEC RTRVL 10 TROC 200 (ENDOMECHANICALS)
BAG SPEC RTRVL LRG 6X4 10 (ENDOMECHANICALS)
BENZOIN TINCTURE PRP APPL 2/3 (GAUZE/BANDAGES/DRESSINGS) ×3 IMPLANT
CABLE HIGH FREQUENCY MONO STRZ (ELECTRODE) ×3 IMPLANT
CATH REDDICK CHOLANGI 4FR 50CM (CATHETERS) ×3 IMPLANT
CHLORAPREP W/TINT 26ML (MISCELLANEOUS) ×3 IMPLANT
CLIP APPLIE 5 13 M/L LIGAMAX5 (MISCELLANEOUS) ×2 IMPLANT
COVER MAYO STAND STRL (DRAPES) ×3 IMPLANT
COVER SURGICAL LIGHT HANDLE (MISCELLANEOUS) ×3 IMPLANT
DECANTER SPIKE VIAL GLASS SM (MISCELLANEOUS) ×3 IMPLANT
DISSECTOR BLUNT TIP ENDO 5MM (MISCELLANEOUS) IMPLANT
DRAPE C-ARM 42X120 X-RAY (DRAPES) ×3 IMPLANT
DRSG TEGADERM 2-3/8X2-3/4 SM (GAUZE/BANDAGES/DRESSINGS) ×12 IMPLANT
ELECT REM PT RETURN 9FT ADLT (ELECTROSURGICAL) ×3
ELECTRODE REM PT RTRN 9FT ADLT (ELECTROSURGICAL) ×2 IMPLANT
ENDOLOOP SUT PDS II  0 18 (SUTURE)
ENDOLOOP SUT PDS II 0 18 (SUTURE) IMPLANT
GAUZE SPONGE 2X2 8PLY STRL LF (GAUZE/BANDAGES/DRESSINGS) ×2 IMPLANT
GLOVE ECLIPSE 8.0 STRL XLNG CF (GLOVE) ×3 IMPLANT
GLOVE INDICATOR 8.0 STRL GRN (GLOVE) ×3 IMPLANT
GOWN STRL REUS W/TWL XL LVL3 (GOWN DISPOSABLE) ×9 IMPLANT
HEMOSTAT SNOW SURGICEL 2X4 (HEMOSTASIS) IMPLANT
IRRIG SUCT STRYKERFLOW 2 WTIP (MISCELLANEOUS) ×3
IRRIGATION SUCT STRKRFLW 2 WTP (MISCELLANEOUS) ×2 IMPLANT
IV CATH 14GX2 1/4 (CATHETERS) ×3 IMPLANT
KIT BASIN OR (CUSTOM PROCEDURE TRAY) ×3 IMPLANT
POUCH RETRIEVAL ECOSAC 10 (ENDOMECHANICALS) IMPLANT
POUCH RETRIEVAL ECOSAC 10MM (ENDOMECHANICALS)
POUCH SPECIMEN RETRIEVAL 10MM (ENDOMECHANICALS) IMPLANT
SCISSORS LAP 5X35 DISP (ENDOMECHANICALS) ×3 IMPLANT
SLEEVE XCEL OPT CAN 5 100 (ENDOMECHANICALS) ×6 IMPLANT
SPONGE GAUZE 2X2 STER 10/PKG (GAUZE/BANDAGES/DRESSINGS) ×1
STRIP CLOSURE SKIN 1/2X4 (GAUZE/BANDAGES/DRESSINGS) ×3 IMPLANT
SUT MNCRL AB 4-0 PS2 18 (SUTURE) ×3 IMPLANT
TOWEL OR 17X26 10 PK STRL BLUE (TOWEL DISPOSABLE) ×3 IMPLANT
TOWEL OR NON WOVEN STRL DISP B (DISPOSABLE) IMPLANT
TRAY LAPAROSCOPIC (CUSTOM PROCEDURE TRAY) ×3 IMPLANT
TROCAR BLADELESS OPT 5 100 (ENDOMECHANICALS) ×3 IMPLANT
TROCAR XCEL BLUNT TIP 100MML (ENDOMECHANICALS) ×3 IMPLANT
TROCAR XCEL NON-BLD 11X100MML (ENDOMECHANICALS) IMPLANT
TUBING INSUF HEATED (TUBING) ×3 IMPLANT

## 2016-08-13 NOTE — Op Note (Signed)
OPERATIVE NOTE-LAPAROSCOPIC CHOLECYSTECTOMY AND INSPECTION OF SMALL BOWEL  Preoperative diagnosis:  Biliary dyskinesia. Question of intussusception of jejunum on CT  Postoperative diagnosis:  Biliary dyskinesia. No evidence of intussusception  Procedure: Laparoscopic cholecystectomy with cholangiogram.  Laparoscopic inspection of small bowel  Surgeon: Avel Peaceodd Joscelyn Hardrick, M.D.  Asst.:  none  Anesthesia: General  Indication:   This is a 45 year old female had upper abdominal pain. CT scan was performed and was suspicious for an intussusception in the jejunum. I saw her in the office and her symptoms were more suspicious for biliary colic. Ultrasound was negative but hepatobiliary scan demonstrated biliary dyskinesia. She now presents for the above procedure.  Technique: She was brought to the operating room, placed supine on the operating table, and a general anesthetic was administered.  The abdominal wall was then sterilely prepped and draped.  A timeout was performed.    Local anesthetic (Marcaine) was infiltrated in the subumbilical region. A small subumbilical incision was made through the skin, subcutaneous tissue, fascia, and peritoneum entering the peritoneal cavity under direct vision. A pursestring suture of 0 Vicryl was placed around the edges of the fascia. A Hassan trocar was introduced into the peritoneal cavity and a pneumoperitoneum was created by insufflation of carbon dioxide gas. The laparoscope was introduced into the trocar and no underlying bleeding or organ injury was noted. He/She was then placed in the reverse Trendelenburg position with the right side tilted slightly up.  Three 5 mm trocars were then placed into the abdominal cavity under laparoscopic vision. One in the epigastric area, and 2 in the right upper quadrant area. The gallbladder was visualized and the fundus was grasped and retracted toward the right shoulder. No acute inflammatory changes were noted. The  infundibulum was mobilized with dissection close to the gallbladder and retracted laterally. The cystic duct was identified and a window was created around it. The anterior cystic artery was also identified anterior to the cystic duct and a window was created around it. It was clipped and divided. The critical view was achieved. A clip was placed at the neck of the gallbladder. A small incision was made in the cystic duct. A cholangiocatheter was introduced through the anterior abdominal wall and placed in the cystic duct. A intraoperative cholangiogram was then performed.  Under real-time fluoroscopy, dilute contrast was injected into the cystic duct.  The common hepatic duct, the right and left hepatic ducts, and the common duct were all visualized. Contrast drained into the duodenum without obvious evidence of any obstructing ductal lesion. The final report is pending the Radiologist's interpretation.  The cholangiocatheter was removed, the cystic duct was clipped 3 times on the biliary side, and then the cystic duct was divided sharply. No bile leak was noted from the cystic duct stump.  The posterior branch of the cystic artery identified, isolated, and was then clipped and divided. Following this the gallbladder was dissected free from the liver using electrocautery. The gallbladder was then placed in a retrieval bag and removed from the abdominal cavity through the subumbilical incision.  The gallbladder fossa was inspected, irrigated, and bleeding was controlled with electrocautery. Inspection showed that hemostasis was adequate and there was no evidence of bile leak.  The irrigation fluid was evacuated as much as possible.  Following this, I ran the small bowel from the ligament of Treitz to the ileocecal valve. There is no evidence of intussusception. No obvious evidence of a small bowel mass.  Next, the subumbilical trocar was removed and the  fascial defect was closed by tightening and tying  down the pursestring suture under laparoscopic vision.  The remaining trocars were removed and the pneumoperitoneum was released. The skin incisions were closed with 4-0 Monocryl subcuticular stitches. Steri-Strips and sterile dressings were applied.  The procedure was well-tolerated without any apparent complications. She was taken to the recovery room in satisfactory condition.

## 2016-08-13 NOTE — Anesthesia Postprocedure Evaluation (Signed)
Anesthesia Post Note  Patient: Alyson InglesDean Marrone-Edwards  Procedure(s) Performed: Procedure(s) (LRB): LAPAROSCOPIC CHOLECYSTECTOMY WITH INTRAOPERATIVE CHOLANGIOGRAM (N/A) SMALL BOWEL RESECTION (N/A)  Patient location during evaluation: PACU Anesthesia Type: MAC Level of consciousness: awake and alert and oriented Pain management: pain level controlled Vital Signs Assessment: post-procedure vital signs reviewed and stable Respiratory status: spontaneous breathing, nonlabored ventilation, respiratory function stable and patient connected to nasal cannula oxygen Cardiovascular status: stable and blood pressure returned to baseline Postop Assessment: no signs of nausea or vomiting Anesthetic complications: no    Last Vitals:  Vitals:   08/13/16 0930 08/13/16 0945  BP: 122/76 125/74  Pulse: 80 82  Resp: (!) 22 17  Temp:      Last Pain:  Vitals:   08/13/16 0930  TempSrc:   PainSc: 7                  Laymon Stockert A.

## 2016-08-13 NOTE — Discharge Instructions (Addendum)
General Anesthesia, Adult, Care After These instructions provide you with information about caring for yourself after your procedure. Your health care provider may also give you more specific instructions. Your treatment has been planned according to current medical practices, but problems sometimes occur. Call your health care provider if you have any problems or questions after your procedure. What can I expect after the procedure? After the procedure, it is common to have:  Vomiting.  A sore throat.  Mental slowness. It is common to feel:  Nauseous.  Cold or shivery.  Sleepy.  Tired.  Sore or achy, even in parts of your body where you did not have surgery. Follow these instructions at home: For at least 24 hours after the procedure:  Do not:  Participate in activities where you could fall or become injured.  Drive.  Use heavy machinery.  Drink alcohol.  Take sleeping pills or medicines that cause drowsiness.  Make important decisions or sign legal documents.  Take care of children on your own.  Rest. Eating and drinking  If you vomit, drink water, juice, or soup when you can drink without vomiting.  Drink enough fluid to keep your urine clear or pale yellow.  Make sure you have little or no nausea before eating solid foods.  Follow the diet recommended by your health care provider. General instructions  Have a responsible adult stay with you until you are awake and alert.  Return to your normal activities as told by your health care provider. Ask your health care provider what activities are safe for you.  Take over-the-counter and prescription medicines only as told by your health care provider.  If you smoke, do not smoke without supervision.  Keep all follow-up visits as told by your health care provider. This is important. Contact a health care provider if:  You continue to have nausea or vomiting at home, and medicines are not helpful.  You  cannot drink fluids or start eating again.  You cannot urinate after 8-12 hours.  You develop a skin rash.  You have fever.  You have increasing redness at the site of your procedure. Get help right away if:  You have difficulty breathing.  You have chest pain.  You have unexpected bleeding.  You feel that you are having a life-threatening or urgent problem. This information is not intended to replace advice given to you by your health care provider. Make sure you discuss any questions you have with your health care provider. Document Released: 11/22/2000 Document Revised: 01/19/2016 Document Reviewed: 07/31/2015 Elsevier Interactive Patient Education  2017 Elsevier Inc[employer] [facility]CS ______[facility] LAPAROSCOPIC CHOLECYSTECTOMY SURGERY: POST OP INSTRUCTIONS Always review your discharge instruction sheet given to you by the facility where your surgery was performed. IF YOU HAVE DISABILITY OR FAMILY LEAVE FORMS, YOU MUST BRING THEM TO THE OFFICE FOR PROCESSING.   DO NOT GIVE THEM TO YOUR DOCTOR.  1. A prescription for pain medication may be given to you upon discharge.  Take your pain medication as prescribed, if needed.  If narcotic pain medicine is not needed, then you may take acetaminophen (Tylenol) or ibuprofen (Advil) as needed. 2. Take your usually prescribed medications unless otherwise directed. 3. If you need a refill on your pain medication, please contact your pharmacy.  They will contact our office to request authorization. Prescriptions will not be filled after 5pm or on week-ends. 4. You should follow a liquid diet the first 2 days after arrival home, such as soup and  crackers, etc.  Be sure to include lots of fluids daily.  May start lowfat, solid foods 2 days after the surgery. 5. Most patients will experience some swelling and bruising in the area of the incisions.  Ice packs will help.  Swelling and bruising can take several days to resolve.  6. It is  common to experience some constipation if taking pain medication after surgery.  Increasing fluid intake and taking a stool softener (such as Colace) will usually help or prevent this problem from occurring.  A mild laxative (Milk of Magnesia or Miralax) should be taken according to package instructions if there are no bowel movements after 48 hours. 7. Unless discharge instructions indicate otherwise, you may remove your bandages 72 hours after surgery.  You may shower the day after surgery.  You may have steri-strips (small skin tapes) in place directly over the incision.  These strips should be left on the skin until they fall off.  If your surgeon used skin glue on the incision, you may shower in 24 hours.  The glue will flake off over the next 2-3 weeks.  Any sutures or staples will be removed at the office during your follow-up visit. 8. ACTIVITIES:  You may resume regular (light) daily activities beginning the next day--such as daily self-care, walking, climbing stairs--gradually increasing activities as tolerated.  You may have sexual intercourse when it is comfortable.  Refrain from any heavy lifting or straining for two weeks.  Do not lift anything over 10 pounds during that time.  a. You may drive when you are no longer taking prescription pain medication, you can comfortably wear a seatbelt, and you can safely maneuver your car and apply brakes. b. RETURN TO WORK:  Desk type work in 1 week, full duty work in 2 weeks if you are pain-free.________________________________________________________ 9. You should see your doctor in the office for a follow-up appointment approximately 2-3 weeks after your surgery.  Make sure that you call for this appointment within a day or two after you arrive home to insure a convenient appointment time. 10. OTHER INSTRUCTIONS: __________________________________________________________________________________________________________________________  __________________________________________________________________________________________________________________________ WHEN TO CALL YOUR DOCTOR: 1. Fever over 101.0 2. Inability to urinate 3. Continued bleeding from incision. 4. Increased pain, redness, or drainage from the incision. 5. Increasing abdominal pain  The clinic staff is available to answer your questions during regular business hours.  Please dont hesitate to call and ask to speak to one of the nurses for clinical concerns.  If you have a medical emergency, go to the nearest emergency room or call 911.  [facility] is always on call at the hospital. [address], [address], [place], [place]  [id number] ? [address] [address], [place], [place]   [id number] [phone] ? [phone] ? FAX [phone] Web site: [email or web address]

## 2016-08-13 NOTE — Transfer of Care (Signed)
Immediate Anesthesia Transfer of Care Note  Patient: Norma [name]  Procedure(s) Performed: Procedure(s): LAPAROSCOPIC CHOLECYSTECTOMY WITH INTRAOPERATIVE CHOLANGIOGRAM (N/A) SMALL BOWEL RESECTION (N/A)  Patient Location: PACU  Anesthesia Type:General  Level of Consciousness:  sedated, patient cooperative and responds to stimulation  Airway & Oxygen Therapy:Patient Spontanous Breathing and Patient connected to face mask oxgen  Post-op Assessment:  Report given to PACU RN and Post -op Vital signs reviewed and stable, patient nauseous-reglan given.  Post vital signs:  Reviewed and stable  Last Vitals:  Vitals:   08/13/16 0539  BP: 131/87  Pulse: 80  Resp: 18  Temp: 37 C    Complications: No apparent anesthesia complications

## 2016-08-13 NOTE — Anesthesia Procedure Notes (Signed)
Procedure Name: Intubation Date/Time: 08/13/2016 7:38 AM Performed by: Jhonnie GarnerMARSHALL, Gotti Alwin M Pre-anesthesia Checklist: Patient identified, Emergency Drugs available, Suction available and Patient being monitored Patient Re-evaluated:Patient Re-evaluated prior to inductionOxygen Delivery Method: Circle system utilized Preoxygenation: Pre-oxygenation with 100% oxygen Intubation Type: IV induction Ventilation: Mask ventilation without difficulty Laryngoscope Size: Mac and 4 Grade View: Grade II Tube type: Oral Tube size: 7.5 mm Number of attempts: 1 Airway Equipment and Method: Stylet Placement Confirmation: ETT inserted through vocal cords under direct vision,  positive ETCO2 and breath sounds checked- equal and bilateral Secured at: 22 cm Tube secured with: Tape Dental Injury: Teeth and Oropharynx as per pre-operative assessment

## 2016-08-13 NOTE — H&P (View-Only) (Signed)
Norma [name] 07/28/2016 11:56 AM Location: [facility] Patient #: [id number] DOB: 04/22/1971 Married / Language: English / Race: White Female  History of Present Illness (Norma [name] J. Norma [name] MD; 07/28/2016 12:17 PM) The patient is a 45 year old female.   Note:She presents today to discuss her radiologic findings. Ultrasound did not demonstrate any gallstones. However, nuclear medicine hepatobiliary imaging demonstrates a low gallbladder ejection fraction (10%). This is consistent with biliary dyskinesia which is more concordant with her symptoms versus transient jejunal intussusception. Her husband is here with her. She continues to have the nausea.  Allergies (Sade Bradford, CMA; 07/28/2016 11:57 AM) Codeine and Related Hives. No Known Drug Allergies 07/28/2016 (Marked as Inactive) Depakote *ANTICONVULSANTS* Hives.  Medication History (Sade Bradford, CMA; 07/28/2016 11:59 AM) Calcium Gluconate (daily) Active. Advil Allergy Sinus (2-30-200MG Tablet, Oral as needed) Active. ClonazePAM (1MG Tablet, Oral daily) Active. Flexeril (10MG Tablet, Oral daily) Active. Flonase (50MCG/ACT Suspension, Nasal as needed) Active. Multi-Minerals (Oral daily) Active. Venlafaxine HCl ER (150MG Capsule ER 24HR, Oral daily) Active. Dicyclomine HCl (10MG Capsule, Oral as needed) Active. Medications Reconciled    Vitals (Sade Bradford CMA; 07/28/2016 11:59 AM) 07/28/2016 11:59 AM Weight: 255.2 lb Height: 67in Body Surface Area: 2.24 m Body Mass Index: 39.97 kg/m  Temp.: 98.8F  Pulse: 87 (Regular)  BP: 124/82 (Sitting, Left Arm, Standard)      Physical Exam (Evadna Donaghy J. Diquan Kassis MD; 07/28/2016 12:18 PM)  The physical exam findings are as follows: Note:General-obese female no acute distress.  Eyes-extraocular motions intact, no icterus  Abd-Soft, obese, nontender, no mass, no hernia.    Assessment & Plan (Trygve Thal J. Emmalyn Hinson MD; 07/28/2016  12:21 PM)  BILIARY DYSKINESIA (K82.8) Impression: This is more consistent with her symptoms.  Plan: I recommended laparoscopic cholecystectomy with cholangiogram. We will also look at the small bowel and if there is an intussusception and the mass, we discussed doing a limited small bowel resection at the same time. I have explained the procedure, risks, and aftercare of cholecystectomy. Risks include but are not limited to bleeding, infection, wound problems, anesthesia, diarrhea, bile leak, injury to common bile duct/liver/intestine, anastomotic leak (if small bowel resection done). She and her husband seem to understand and would like to proceed. We will start the scheduling process.  Nicholas Trompeter, M.D. 

## 2016-08-13 NOTE — Interval H&P Note (Signed)
History and Physical Interval Note:  08/13/2016 7:25 AM  Norma [name]  has presented today for surgery, with the diagnosis of biliary dyskinesia transient jejunal intussusception on CT  The various methods of treatment have been discussed with the patient and family. After consideration of risks, benefits and other options for treatment, the patient has consented to  Procedure(s): LAPAROSCOPIC CHOLECYSTECTOMY WITH INTRAOPERATIVE CHOLANGIOGRAM (N/A) SMALL BOWEL RESECTION (N/A) as a surgical intervention .  The patient's history has been reviewed, patient examined, no change in status, stable for surgery.  I have reviewed the patient's chart and labs.  Questions were answered to the patient's satisfaction.     Diany Formosa JShela Commons

## 2016-08-15 ENCOUNTER — Other Ambulatory Visit: Payer: Self-pay | Admitting: Family Medicine

## 2016-11-03 ENCOUNTER — Encounter: Payer: Self-pay | Admitting: Family Medicine

## 2016-11-03 ENCOUNTER — Ambulatory Visit (INDEPENDENT_AMBULATORY_CARE_PROVIDER_SITE_OTHER): Payer: BLUE CROSS/BLUE SHIELD | Admitting: Family Medicine

## 2016-11-03 VITALS — BP 128/86 | HR 95 | Temp 98.0°F | Resp 16 | Ht 67.0 in | Wt 256.0 lb

## 2016-11-03 DIAGNOSIS — R109 Unspecified abdominal pain: Secondary | ICD-10-CM

## 2016-11-03 DIAGNOSIS — R197 Diarrhea, unspecified: Secondary | ICD-10-CM

## 2016-11-03 LAB — BASIC METABOLIC PANEL
BUN: 15 mg/dL (ref 6–23)
CO2: 32 meq/L (ref 19–32)
Calcium: 9.4 mg/dL (ref 8.4–10.5)
Chloride: 103 mEq/L (ref 96–112)
Creatinine, Ser: 0.98 mg/dL (ref 0.40–1.20)
GFR: 65.66 mL/min (ref >60.00)
GLUCOSE: 85 mg/dL (ref 70–99)
POTASSIUM: 4.2 meq/L (ref 3.5–5.1)
SODIUM: 141 meq/L (ref 135–145)

## 2016-11-03 LAB — HEPATIC FUNCTION PANEL
ALBUMIN: 4.3 g/dL (ref 3.5–5.2)
ALK PHOS: 98 U/L (ref 39–117)
ALT: 30 U/L (ref 0–35)
AST: 23 U/L (ref 0–37)
Bilirubin, Direct: 0.1 mg/dL (ref 0.0–0.3)
TOTAL PROTEIN: 7.1 g/dL (ref 6.0–8.3)
Total Bilirubin: 0.3 mg/dL (ref 0.2–1.2)

## 2016-11-03 LAB — CBC WITH DIFFERENTIAL/PLATELET
BASOS PCT: 0.6 % (ref 0.0–3.0)
Basophils Absolute: 0 10*3/uL (ref 0.0–0.1)
EOS PCT: 2.4 % (ref 0.0–5.0)
Eosinophils Absolute: 0.1 10*3/uL (ref 0.0–0.7)
HCT: 37.3 % (ref 36.0–46.0)
Hemoglobin: 12.2 g/dL (ref 12.0–15.0)
LYMPHS ABS: 1.8 10*3/uL (ref 0.7–4.0)
Lymphocytes Relative: 32.6 % (ref 12.0–46.0)
MCHC: 32.7 g/dL (ref 30.0–36.0)
MCV: 84.5 fl (ref 78.0–100.0)
MONO ABS: 0.3 10*3/uL (ref 0.1–1.0)
Monocytes Relative: 4.5 % (ref 3.0–12.0)
NEUTROS ABS: 3.4 10*3/uL (ref 1.4–7.7)
NEUTROS PCT: 59.9 % (ref 43.0–77.0)
PLATELETS: 203 10*3/uL (ref 150.0–400.0)
RBC: 4.42 Mil/uL (ref 3.87–5.11)
RDW: 14.7 % (ref 11.5–15.5)
WBC: 5.6 10*3/uL (ref 4.0–10.5)

## 2016-11-03 LAB — TSH: TSH: 1.01 u[IU]/mL (ref 0.35–4.50)

## 2016-11-03 MED ORDER — DICYCLOMINE HCL 20 MG PO TABS: 20.0000 mg | ORAL_TABLET | Freq: Three times a day (TID) | ORAL | 1 refills | Status: DC

## 2016-11-03 NOTE — Patient Instructions (Signed)
Follow up by phone or MyChart in the next 2 weeks to let me know how you're doing Start the Bentyl (Dicyclomine) as needed for the abdominal spasm/cramping Increase your water intake Try and add fiber (but it sounds like you're doing well on your veggies) Try and resume regular exercise Call with any questions or concerns Hang in there!!  Irritable Bowel Syndrome, Adult Irritable bowel syndrome (IBS) is not one specific disease. It is a group of symptoms that affects the organs responsible for digestion (gastrointestinal or GI tract). To regulate how your GI tract works, your body sends signals back and forth between your intestines and your brain. If you have IBS, there may be a problem with these signals. As a result, your GI tract does not function normally. Your intestines may become more sensitive and overreact to certain things. This is especially true when you eat certain foods or when you are under stress. There are four types of IBS. These may be determined based on the consistency of your stool:  IBS with diarrhea.  IBS with constipation.  Mixed IBS.  Unsubtyped IBS. It is important to know which type of IBS you have. Some treatments are more likely to be helpful for certain types of IBS. What are the causes? The exact cause of IBS is not known. What increases the risk? You may have a higher risk of IBS if:  You are a woman.  You are younger than 44 years old.  You have a family history of IBS.  You have mental health problems.  You have had bacterial infection of your GI tract. What are the signs or symptoms? Symptoms of IBS vary from person to person. The main symptom is abdominal pain or discomfort. Additional symptoms usually include one or more of the following:  Diarrhea, constipation, or both.  Abdominal swelling or bloating.  Feeling full or sick after eating a small or regular-size meal.  Frequent gas.  Mucus in the stool.  A feeling of having more  stool left after a bowel movement. Symptoms tend to come and go. They may be associated with stress, psychiatric conditions, or nothing at all. How is this diagnosed? There is no specific test to diagnose IBS. Your health care provider will make a diagnosis based on a physical exam, medical history, and your symptoms. You may have other tests to rule out other conditions that may be causing your symptoms. These may include:  Blood tests.  X-rays.  CT scan.  Endoscopy and colonoscopy. This is a test in which your GI tract is viewed with a long, thin, flexible tube. How is this treated? There is no cure for IBS, but treatment can help relieve symptoms. IBS treatment often includes:  Changes to your diet, such as:  Eating more fiber.  Avoiding foods that cause symptoms.  Drinking more water.  Eating regular, medium-sized portioned meals.  Medicines. These may include:  Fiber supplements if you have constipation.  Medicine to control diarrhea (antidiarrheal medicines).  Medicine to help control muscle spasms in your GI tract (antispasmodic medicines).  Medicines to help with any mental health issues, such as antidepressants or tranquilizers.  Therapy.  Talk therapy may help with anxiety, depression, or other mental health issues that can make IBS symptoms worse.  Stress reduction.  Managing your stress can help keep symptoms under control. Follow these instructions at home:  Take medicines only as directed by your health care provider.  Eat a healthy diet.  Avoid foods and drinks  with added sugar.  Include more whole grains, fruits, and vegetables gradually into your diet. This may be especially helpful if you have IBS with constipation.  Avoid any foods and drinks that make your symptoms worse. These may include dairy products and caffeinated or carbonated drinks.  Do not eat large meals.  Drink enough fluid to keep your urine clear or pale yellow.  Exercise  regularly. Ask your health care provider for recommendations of good activities for you.  Keep all follow-up visits as directed by your health care provider. This is important. Contact a health care provider if:  You have constant pain.  You have trouble or pain with swallowing.  You have worsening diarrhea. Get help right away if:  You have severe and worsening abdominal pain.  You have diarrhea and:  You have a rash, stiff neck, or severe headache.  You are irritable, sleepy, or difficult to awaken.  You are weak, dizzy, or extremely thirsty.  You have bright red blood in your stool or you have black tarry stools.  You have unusual abdominal swelling that is painful.  You vomit continuously.  You vomit blood (hematemesis).  You have both abdominal pain and a fever. This information is not intended to replace advice given to you by your health care provider. Make sure you discuss any questions you have with your health care provider. Document Released: 08/16/2005 Document Revised: 01/16/2016 Document Reviewed: 05/03/2014 Elsevier Interactive Patient Education  2017 [employer].

## 2016-11-03 NOTE — Progress Notes (Signed)
Pre visit review using our clinic review tool, if applicable. No additional management support is needed unless otherwise documented below in the visit note. 

## 2016-11-03 NOTE — Progress Notes (Signed)
   Subjective:    Patient ID: Norma [name], female    DOB: 08/06/1971, 46 y.o.   MRN: [id number]  HPI abd pain/diarrhea- pt had gallbladder removed in December but continues to have pain.  Pt reports unless stools are runny, she is doubled over in pain.  Pt talked about this w/ Dr Abbey Chattersosenbower and he feels she may have IBS.  Pain will occur in anticipation of BM.  Pt reports she has diarrhea, 'more than not'.  No vomiting but some nausea due to level of pain.  Pt has not exercised since November.   Review of Systems For ROS see HPI     Objective:   Physical Exam  Constitutional: She is oriented to person, place, and time. She appears well-developed and well-nourished. No distress.  obese  Abdominal: Soft. Bowel sounds are normal. She exhibits no distension. There is no tenderness. There is no rebound and no guarding.  Neurological: She is alert and oriented to person, place, and time.  Skin: Skin is warm and dry.  Psychiatric: She has a normal mood and affect. Her behavior is normal. Thought content normal.  Vitals reviewed.         Assessment & Plan:  Diarrhea/abdominal cramping- pt's sxs are consistent w/ IBS but will check labs to r/o infection or metabolic cause.  Suspect the diarrhea is due to her recent cholecystectomy.  Reviewed importance of increased fiber, water, and exercise.  Start bentyl.  If no improvement, will need GI referral.  Reviewed supportive care and red flags that should prompt return.  Pt expressed understanding and is in agreement w/ plan.

## 2016-11-17 ENCOUNTER — Encounter: Payer: BLUE CROSS/BLUE SHIELD | Admitting: Gynecology

## 2016-11-22 ENCOUNTER — Encounter: Payer: BLUE CROSS/BLUE SHIELD | Admitting: Gynecology

## 2016-12-09 ENCOUNTER — Encounter: Payer: Self-pay | Admitting: Gynecology

## 2016-12-09 ENCOUNTER — Ambulatory Visit (INDEPENDENT_AMBULATORY_CARE_PROVIDER_SITE_OTHER): Payer: BLUE CROSS/BLUE SHIELD | Admitting: Gynecology

## 2016-12-09 VITALS — BP 118/74 | Ht 68.0 in | Wt 255.0 lb

## 2016-12-09 DIAGNOSIS — N393 Stress incontinence (female) (male): Secondary | ICD-10-CM | POA: Diagnosis not present

## 2016-12-09 DIAGNOSIS — Z01419 Encounter for gynecological examination (general) (routine) without abnormal findings: Secondary | ICD-10-CM

## 2016-12-09 DIAGNOSIS — Z309 Encounter for contraceptive management, unspecified: Secondary | ICD-10-CM | POA: Diagnosis not present

## 2016-12-09 NOTE — Progress Notes (Signed)
    Ashwini Jago 05/05/1971 [id number]        46 y.o.  G2P2 for annual exam.    Past medical history,surgical history, problem list, medications, allergies, family history and social history were all reviewed and documented as reviewed in the EPIC chart.  ROS:  Performed with pertinent positives and negatives included in the history, assessment and plan.   Additional significant findings :  None    Exam: Kennon Portela assistant Vitals:   12/09/16 1126  BP: 118/74  Weight: 255 lb (115.7 kg)  Height:  (1.727 m)   Body mass index is 38.77 kg/m.  General appearance:  Normal affect, orientation and appearance. Skin: Grossly normal HEENT: Without gross lesions.  No cervical or supraclavicular adenopathy. Thyroid normal.  Lungs:  Clear without wheezing, rales or rhonchi Cardiac: RR, without RMG Abdominal:  Soft, nontender, without masses, guarding, rebound, organomegaly or hernia Breasts:  Examined lying and sitting without masses, retractions, discharge or axillary adenopathy. Pelvic:  Ext, BUS, Vagina: Normal  Cervix: Normal  Uterus: Anteverted, normal size, shape and contour, midline and mobile nontender   Adnexa: Without masses or tenderness    Anus and perineum: Normal   Rectovaginal: Normal sphincter tone without palpated masses or tenderness.    Assessment/Plan:  46 y.o. G2P2 female for annual exam with regular menses, condom contraception.   1. Contraception. Patient wants to proceed with Mirena IUD. Has had this before. Menses are getting heavier and she wants to use it for contraception and menstrual regulation. Had a bad experience with her last IUD with lot of cramping and we ultimately removed it. Had a good experience with her first IUD. Patient will arrange follow up for her IUD. 2. SUI. Patient with symptoms classic for SUI with loss of urine with coughing, sneezing, laughing or exercising. No spontaneous loss. No urgency symptoms. Check urinalysis today.  Options for management to include Kegel exercises as well as surgery with referral to urology discussed. Patient not ready to move forward to call if she wants referral. 3. Pap smear/HPV 2016. No Pap smear done today. No history of significant abnormal Pap smears. Plan follow up Pap smear at 5 year interval per current screening guidelines. 4. Mammography never. I again strongly recommended she schedule a screening mammogram. Benefits of early detection discussed. Patient agrees to call and schedule. SBE monthly reviewed. 5. Health maintenance. No routine lab work done as patient does this elsewhere. Follow up for Mirena IUD. Follow up in one year for annual exam. Call to schedule her mammogram.   [name], 11:55 AM 12/09/2016

## 2016-12-09 NOTE — Addendum Note (Signed)
Addended by: Rushie Goltz on: 12/09/2016 12:39 PM   Modules accepted: Orders

## 2016-12-09 NOTE — Patient Instructions (Signed)
Follow up for IUD as scheduled

## 2017-01-03 ENCOUNTER — Other Ambulatory Visit: Payer: Self-pay | Admitting: General Practice

## 2017-01-03 MED ORDER — VENLAFAXINE HCL ER 150 MG PO CP24: ORAL_CAPSULE | ORAL | 0 refills | Status: DC

## 2017-01-17 ENCOUNTER — Other Ambulatory Visit: Payer: Self-pay | Admitting: Family Medicine

## 2017-01-17 NOTE — Telephone Encounter (Signed)
Last OV 11/03/16 Clonazepam last filled 07/20/16 #30 with 3

## 2017-01-17 NOTE — Telephone Encounter (Signed)
Medication filled to pharmacy as requested.   

## 2017-01-18 ENCOUNTER — Ambulatory Visit (INDEPENDENT_AMBULATORY_CARE_PROVIDER_SITE_OTHER): Payer: BLUE CROSS/BLUE SHIELD

## 2017-01-18 ENCOUNTER — Ambulatory Visit (INDEPENDENT_AMBULATORY_CARE_PROVIDER_SITE_OTHER): Payer: BLUE CROSS/BLUE SHIELD | Admitting: Physician Assistant

## 2017-01-18 ENCOUNTER — Encounter: Payer: Self-pay | Admitting: Physician Assistant

## 2017-01-18 VITALS — BP 120/84 | HR 88 | Temp 99.0°F | Resp 14 | Ht 68.0 in | Wt 255.0 lb

## 2017-01-18 DIAGNOSIS — S99921A Unspecified injury of right foot, initial encounter: Secondary | ICD-10-CM

## 2017-01-18 MED ORDER — MELOXICAM 15 MG PO TABS: 15.0000 mg | ORAL_TABLET | Freq: Every day | ORAL | 0 refills | Status: DC

## 2017-01-18 NOTE — Progress Notes (Signed)
Patient presents to clinic today c/o pain in R foot and ankle s/p fall this morning. States she landed on top of foot and ankle. Rolled ankle during fall. Notes throbbing pain, severe -- top of the foot and lateral ankle. Some swelling and pain in the great toe. Has taken some Ibuprofen.  Past Medical History:  Diagnosis Date  . Anxiety   . ASCUS (atypical squamous cells of undetermined significance) on Pap smear 07   NEG HPV; BENIGN CHANGES 4/08  . Chronic back pain   . Claustrophobia    with mask on  . Family history of adverse reaction to anesthesia    daughter Aundra Milletmegan low blood pressure during ear tube surgery 2 years ago  . GERD (gastroesophageal reflux disease)   . Headache    hx migraines once a year or so now  . Sleep apnea    C PAP  . 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 Urinary incontinence    Stress incontinence    Current Outpatient Prescriptions on File Prior to Visit  Medication Sig Dispense Refill  . calcium carbonate (TUMS - DOSED IN MG ELEMENTAL CALCIUM) 500 MG chewable tablet Chew 2 tablets by mouth as needed for indigestion or heartburn.    Marland Kitchen. CALCIUM PO Take 1 tablet by mouth daily. When remembers    . clonazePAM (KLONOPIN) 1 MG tablet TAKE 1 TABLET NIGHTLY AT BEDTIME 30 tablet 3  . dicyclomine (BENTYL) 20 MG tablet Take 1 tablet (20 mg total) by mouth 4 (four) times daily -  before meals and at bedtime. 90 tablet 1  . ibuprofen (ADVIL,MOTRIN) 200 MG tablet Take 800 mg by mouth every 6 (six) hours as needed for moderate pain.    . Multiple Vitamin (MULTIVITAMIN) tablet Take 1 tablet by mouth daily.    Marland Kitchen. omeprazole (PRILOSEC) 20 MG capsule Take 20 mg by mouth daily.    Marland Kitchen. venlafaxine XR (EFFEXOR-XR) 150 MG 24 hr capsule TAKE ONE CAPSULE EVERY DAY WITH BREAKFAST 90 capsule 0   No current facility-administered medications on file prior to visit.     Allergies  Allergen Reactions  . Codeine Hives  . Depakote [Divalproex Sodium] Other (See  Comments)    blisters    Family History  Problem Relation Age of Onset  . Hypertension Maternal Grandfather   . Heart disease Maternal Grandfather   . Hypertension Paternal Grandmother   . Heart disease Paternal Grandmother   . Hypertension Paternal Grandfather   . Heart disease Paternal Grandfather   . Depression Father   . Heart failure Maternal Grandmother     Social History   Social History  . Marital status: Married    Spouse name: N/A  . Number of children: N/A  . Years of education: N/A   Social History Main Topics  . Smoking status: Never Smoker  . Smokeless tobacco: Never Used  . Alcohol use 0.0 oz/week     Comment: SOCIALLY  . Drug use: No  . Sexual activity: Yes    Birth control/ protection: Condom     Comment: 1st intercourse 46 yo-Fewer than 5 partners   Other Topics Concern  . None   Social History Narrative  . None   Review of Systems - See HPI.  All other ROS are negative.  BP 120/84   Pulse 88   Temp 99 F (37.2 C) (Oral)   Resp 14   Ht 5\' 8"  (1.727 m)   Wt  255 lb (115.7 kg)   SpO2 98%   BMI 38.77 kg/m   Physical Exam  Constitutional: She is oriented to person, place, and time and well-developed, well-nourished, and in no distress.  Cardiovascular: Normal rate, regular rhythm, normal heart sounds and intact distal pulses.   Pulmonary/Chest: Effort normal and breath sounds normal. No respiratory distress. She has no wheezes. She has no rales. She exhibits no tenderness.  Musculoskeletal:       Right ankle: She exhibits normal range of motion, no swelling and normal pulse. Tenderness. Lateral malleolus and AITFL tenderness found. No medial malleolus tenderness found. Achilles tendon normal.       Right foot: There is tenderness and bony tenderness. There is normal range of motion and no swelling.       Feet:  Neurological: She is alert and oriented to person, place, and time.  Skin: Skin is warm and dry. No rash noted.  Psychiatric:  Affect normal.  Vitals reviewed.   Recent Results (from the past 2160 hour(s))  Basic metabolic panel     Status: None   Collection Time: 11/03/16 11:06 AM  Result Value Ref Range   Sodium 141 135 - 145 mEq/L   Potassium 4.2 3.5 - 5.1 mEq/L   Chloride 103 96 - 112 mEq/L   CO2 32 19 - 32 mEq/L   Glucose, Bld 85 70 - 99 mg/dL   BUN 15 6 - 23 mg/dL   Creatinine, Ser 1.61 0.40 - 1.20 mg/dL   Calcium 9.4 8.4 - 09.6 mg/dL   GFR 04.54 >09.81 mL/min  CBC with Differential/Platelet     Status: None   Collection Time: 11/03/16 11:06 AM  Result Value Ref Range   WBC 5.6 4.0 - 10.5 K/uL   RBC 4.42 3.87 - 5.11 Mil/uL   Hemoglobin 12.2 12.0 - 15.0 g/dL   HCT 19.1 47.8 - 29.5 %   MCV 84.5 78.0 - 100.0 fl   MCHC 32.7 30.0 - 36.0 g/dL   RDW 62.1 30.8 - 65.7 %   Platelets 203.0 150.0 - 400.0 K/uL   Neutrophils Relative % 59.9 43.0 - 77.0 %   Lymphocytes Relative 32.6 12.0 - 46.0 %   Monocytes Relative 4.5 3.0 - 12.0 %   Eosinophils Relative 2.4 0.0 - 5.0 %   Basophils Relative 0.6 0.0 - 3.0 %   Neutro Abs 3.4 1.4 - 7.7 K/uL   Lymphs Abs 1.8 0.7 - 4.0 K/uL   Monocytes Absolute 0.3 0.1 - 1.0 K/uL   Eosinophils Absolute 0.1 0.0 - 0.7 K/uL   Basophils Absolute 0.0 0.0 - 0.1 K/uL  TSH     Status: None   Collection Time: 11/03/16 11:06 AM  Result Value Ref Range   TSH 1.01 0.35 - 4.50 uIU/mL  Hepatic function panel     Status: None   Collection Time: 11/03/16 11:06 AM  Result Value Ref Range   Total Bilirubin 0.3 0.2 - 1.2 mg/dL   Bilirubin, Direct 0.1 0.0 - 0.3 mg/dL   Alkaline Phosphatase 98 39 - 117 U/L   AST 23 0 - 37 U/L   ALT 30 0 - 35 U/L   Total Protein 7.1 6.0 - 8.3 g/dL   Albumin 4.3 3.5 - 5.2 g/dL   Assessment/Plan: 1. Injury of right foot, initial encounter R ankle sprain most likely with contusion to the toes. Will obtain imaging to r/o fracture. Rx mobic. Recommended ankle brace. Patient refused. RICE. Supportive foot wear. Will alter regimen based on x-ray  results.   -  DG Foot Complete Right; Future - DG Ankle Complete Right; Future   Piedad Climes, PA-C

## 2017-01-18 NOTE — Progress Notes (Signed)
Pre visit review using our clinic review tool, if applicable. No additional management support is needed unless otherwise documented below in the visit note. 

## 2017-01-18 NOTE — Patient Instructions (Signed)
[facility]  I have made this STAT to get the results quickly. I will call as soon as I have these. Take Mobic as directed. Keep extremity iced and elevated while resting. Wear the ankle brace to help with support and pain relief as you have mildlly sprained ankle.  Wear supportive, lace-up foot wear.  We will alter regimen based on x-ray results.

## 2017-01-25 ENCOUNTER — Ambulatory Visit: Payer: BLUE CROSS/BLUE SHIELD | Admitting: Gynecology

## 2017-01-31 ENCOUNTER — Other Ambulatory Visit: Payer: Self-pay | Admitting: Physician Assistant

## 2017-02-17 IMAGING — NM NM HEPATO W/GB/PHARM/[PERSON_NAME]
2 series · 12 of 12 positions shown · non-contrast
Comparison: 06/12/2016, ultrasound and CT

CLINICAL DATA: Mid abdominal pain which extends to the back. Nausea
and vomiting.

EXAM:
NUCLEAR MEDICINE HEPATOBILIARY IMAGING WITH GALLBLADDER EF
TECHNIQUE: Sequential images of the abdomen were obtained [DATE] minutes
following intravenous administration of radiopharmaceutical. After
oral ingestion of Ensure, gallbladder ejection fraction was
determined. At 60 min, normal ejection fraction is greater than 33%.
RADIOPHARMACEUTICALS:  5.2 mCi Tc-HHm  Choletec IV

[he hepatobiliary · 3.43mm/px · 6 of 60 frames shown (1 of 2)]
[frame 6/60]
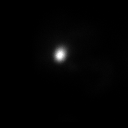
[frame 16/60]
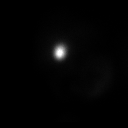
[frame 26/60]
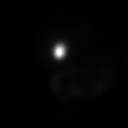
[frame 36/60]
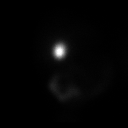
[frame 46/60]
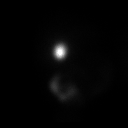
[frame 56/60]
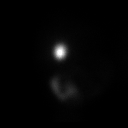

[he hepatobiliary · 3.43mm/px · 6 of 52 frames shown (2 of 2)]
[frame 5/52]
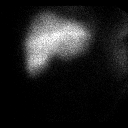
[frame 13/52]
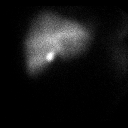
[frame 22/52]
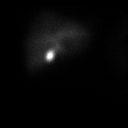
[frame 31/52]
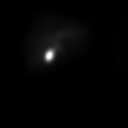
[frame 39/52]
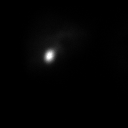
[frame 48/52]
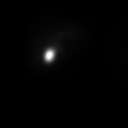

[12 of 12 positions shown; findings below may reference images not displayed]

FINDINGS: There is homogeneous radiotracer uptake within the liver. The
gallbladder is evident by 10 min. Counts are not present within the
small bowel by 60 min. Administration of CCK resulted in minimal
contraction of the gallbladder. Callus present in the bowel.
Gallbladder ejection fraction equals 10.

Calculated gallbladder ejection fraction is 10%. (Normal gallbladder
ejection fraction with Ensure is greater than 33%.)
IMPRESSION: 1. Low gallbladder ejection fraction = 10%. Low EF commonly
represents chronic cholecystitis.
2. Patent cystic duct and common bile duct.

## 2017-05-06 ENCOUNTER — Other Ambulatory Visit: Payer: Self-pay | Admitting: Family Medicine

## 2017-05-19 ENCOUNTER — Encounter: Payer: Self-pay | Admitting: Gynecology

## 2017-05-19 ENCOUNTER — Ambulatory Visit (INDEPENDENT_AMBULATORY_CARE_PROVIDER_SITE_OTHER): Payer: BLUE CROSS/BLUE SHIELD | Admitting: Gynecology

## 2017-05-19 VITALS — BP 118/78

## 2017-05-19 DIAGNOSIS — Z30430 Encounter for insertion of intrauterine contraceptive device: Secondary | ICD-10-CM | POA: Diagnosis not present

## 2017-05-19 HISTORY — PX: INTRAUTERINE DEVICE INSERTION: SHX323

## 2017-05-19 NOTE — Progress Notes (Signed)
    Norma [name] 02-03-1971 [id number]        46 y.o.  G2P2  presents for Mirena IUD placement. She has read through the booklet, has no contraindications and signed the consent form. She currently is on a normal menses.  I reviewed the insertional process with her as well as the risks to include infection, either immediate or long-term, uterine perforation or migration requiring surgery to remove, other complications such as pain, hormonal side effects and possibility of failure with subsequent pregnancy.   Exam with him Licensed conveyancer Vitals:   05/19/17 1008  BP: 118/78    Pelvic: External BUS vagina normal. Cervix normal with menses flow. Uterus anteverted normal size shape contour midline mobile nontender. Adnexa without masses or tenderness.  Procedure: The cervix was cleansed with Betadine, anterior lip grasped with a single-tooth tenaculum, the uterus was sounded and a Mirena IUD was placed according to manufacturer's recommendations without difficulty. The strings were trimmed. The patient tolerated well and will follow up in one month for a postinsertional check.  Lot number:  [id number]    [name], 10:34 AM 05/19/2017

## 2017-05-19 NOTE — Patient Instructions (Signed)

## 2017-06-22 ENCOUNTER — Ambulatory Visit: Payer: BLUE CROSS/BLUE SHIELD | Admitting: Gynecology

## 2017-06-23 ENCOUNTER — Other Ambulatory Visit: Payer: Self-pay | Admitting: Gynecology

## 2017-06-23 DIAGNOSIS — Z1231 Encounter for screening mammogram for malignant neoplasm of breast: Secondary | ICD-10-CM

## 2017-07-01 ENCOUNTER — Ambulatory Visit: Payer: BLUE CROSS/BLUE SHIELD | Admitting: Gynecology

## 2017-07-12 ENCOUNTER — Ambulatory Visit: Payer: BLUE CROSS/BLUE SHIELD | Admitting: Gynecology

## 2017-07-13 ENCOUNTER — Ambulatory Visit: Payer: BLUE CROSS/BLUE SHIELD

## 2017-07-18 ENCOUNTER — Other Ambulatory Visit: Payer: Self-pay | Admitting: Family Medicine

## 2017-07-28 ENCOUNTER — Ambulatory Visit: Payer: BLUE CROSS/BLUE SHIELD | Admitting: Gynecology

## 2017-08-05 ENCOUNTER — Ambulatory Visit: Payer: BLUE CROSS/BLUE SHIELD

## 2017-08-13 DIAGNOSIS — J0140 Acute pansinusitis, unspecified: Secondary | ICD-10-CM | POA: Diagnosis not present

## 2017-08-13 DIAGNOSIS — Z23 Encounter for immunization: Secondary | ICD-10-CM | POA: Diagnosis not present

## 2017-09-01 ENCOUNTER — Ambulatory Visit: Payer: BLUE CROSS/BLUE SHIELD

## 2017-10-07 ENCOUNTER — Encounter: Payer: Self-pay | Admitting: Family Medicine

## 2017-10-07 ENCOUNTER — Other Ambulatory Visit: Payer: Self-pay

## 2017-10-07 ENCOUNTER — Ambulatory Visit: Payer: BLUE CROSS/BLUE SHIELD | Admitting: Family Medicine

## 2017-10-07 VITALS — BP 132/90 | HR 88 | Temp 98.2°F | Ht 68.0 in | Wt 262.2 lb

## 2017-10-07 DIAGNOSIS — J0100 Acute maxillary sinusitis, unspecified: Secondary | ICD-10-CM

## 2017-10-07 DIAGNOSIS — R52 Pain, unspecified: Secondary | ICD-10-CM

## 2017-10-07 LAB — POCT INFLUENZA A/B
INFLUENZA A, POC: NEGATIVE
INFLUENZA B, POC: NEGATIVE

## 2017-10-07 MED ORDER — HYDROCODONE-HOMATROPINE 5-1.5 MG/5ML PO SYRP
5.0000 mL | ORAL_SOLUTION | Freq: Three times a day (TID) | ORAL | 0 refills | Status: DC | PRN
Start: 2017-10-07 — End: 2018-01-04

## 2017-10-07 MED ORDER — BENZONATATE 100 MG PO CAPS: 100.0000 mg | ORAL_CAPSULE | Freq: Two times a day (BID) | ORAL | 0 refills | Status: DC | PRN

## 2017-10-07 MED ORDER — CEFUROXIME AXETIL 500 MG PO TABS: 500.0000 mg | ORAL_TABLET | Freq: Two times a day (BID) | ORAL | 0 refills | Status: DC

## 2017-10-07 NOTE — Progress Notes (Signed)
Subjective   CC:  Chief Complaint  Patient presents with  . Cough  . Nasal Congestion  . Generalized Body Aches    HPI: Norma [name] is a 47 y.o. female who presents to the office today to address the problems listed above in the chief complaint.  Patient reports sinus congestion and pressure with thick drainage, nonproductive cough, ear pressure without pain, and mild malaise with headache.  Symptoms have been present for several days.Shedenies high fevers, GI symptoms, shortness of breath. Shehas had sinus infections in the past and this feels similar.  Patient is a non-smoker.  No history of asthma or COPD.  I reviewed the patients updated PMH, FH, and SocHx.    Patient Active Problem List   Diagnosis Date Noted  . RLS (restless legs syndrome) 12/31/2013  . Obesity (BMI 30-39.9) 12/03/2013  . OSA (obstructive sleep apnea) 12/03/2013  . Depression with anxiety 01/03/2013  . Back pain, thoracic 01/03/2013   Current Meds  Medication Sig  . calcium carbonate (TUMS - DOSED IN MG ELEMENTAL CALCIUM) 500 MG chewable tablet Chew 2 tablets by mouth as needed for indigestion or heartburn.  Marland Kitchen. CALCIUM PO Take 1 tablet by mouth daily. When remembers  . clonazePAM (KLONOPIN) 1 MG tablet TAKE 1 TABLET NIGHTLY AT BEDTIME  . ibuprofen (ADVIL,MOTRIN) 200 MG tablet Take 800 mg by mouth every 6 (six) hours as needed for moderate pain.  . Multiple Vitamin (MULTIVITAMIN) tablet Take 1 tablet by mouth daily.  Marland Kitchen. omeprazole (PRILOSEC) 20 MG capsule Take 20 mg by mouth daily.  Marland Kitchen. venlafaxine XR (EFFEXOR-XR) 150 MG 24 hr capsule TAKE ONE CAPSULE EVERY DAY WITH BREAKFAST    Review of Systems: Cardiovascular: negative for chest pain Respiratory: negative for SOB or persistent cough Gastrointestinal: negative for abdominal pain Genitourinary: negative for dysuria or gross hematuria  Objective  Vitals: BP (!) 142/98   Pulse 88   Temp 98.2 F (36.8 C)   Ht 5\' 8"  (1.727 m)   Wt 262 lb 3.2  oz (118.9 kg)   BMI 39.87 kg/m  General: no acute distress  Psych:  Alert and oriented, normal mood and affect HEENT:  Normocephalic, atraumatic, TMs with serous effusions or retraction w/o erythema, nasal mucosa is red with purulent drainage, tender maxillary sinus present, OP mild erythematous w/o eudate, supple neck with LAD Cardiovascular:  RRR without murmur or gallop. no peripheral edema Respiratory:  Good breath sounds bilaterally, CTAB with normal respiratory effort Skin:  Warm, no rashes Neurologic:   Mental status is normal. normal gait  Assessment  1. Acute non-recurrent maxillary sinusitis      Plan    Sinusitis: History and exam is most consistent with bacterial sinus infection.  Etiology and prognosis discussed with patient.  Recommend antibiotics as ordered below.  Patient to complete course of antibiotics, use supportive medications like mucolytics and decongestants as needed.  May use Tylenol or Advil if needed.  Symptoms should improve over the next 2 weeks.  Patient will return or call if symptoms persist or worsen.  Follow up: Return if symptoms worsen or fail to improve.    Commons side effects, risks, benefits, and alternatives for medications and treatment plan prescribed today were discussed, and the patient expressed understanding of the given instructions. Patient is instructed to call or message via MyChart if he/she has any questions or concerns regarding our treatment plan. No barriers to understanding were identified. We discussed Red Flag symptoms and signs in detail. Patient expressed understanding regarding  what to do in case of urgent or emergency type symptoms.   Medication list was reconciled, printed and provided to the patient in AVS. Patient instructions and summary information was reviewed with the patient as documented in the AVS. This note was prepared with assistance of Dragon voice recognition software. Occasional wrong-word or sound-a-like  substitutions may have occurred due to the inherent limitations of voice recognition software  No orders of the defined types were placed in this encounter.  Meds ordered this encounter  Medications  . benzonatate (TESSALON) 100 MG capsule    Sig: Take 1 capsule (100 mg total) by mouth 2 (two) times daily as needed for cough.    Dispense:  20 capsule    Refill:  0  . HYDROcodone-homatropine (HYCODAN) 5-1.5 MG/5ML syrup    Sig: Take 5 mLs by mouth every 8 (eight) hours as needed for cough.    Dispense:  120 mL    Refill:  0  . cefUROXime (CEFTIN) 500 MG tablet    Sig: Take 1 tablet (500 mg total) by mouth 2 (two) times daily with a meal.    Dispense:  14 tablet    Refill:  0

## 2017-10-07 NOTE — Addendum Note (Signed)
Addended byDene Gentry: Keshav Winegar M on: 10/07/2017 09:11 AM   Modules accepted: Orders

## 2017-10-07 NOTE — Patient Instructions (Signed)
Please follow up if symptoms do not improve or as needed.   

## 2017-10-13 ENCOUNTER — Other Ambulatory Visit: Payer: Self-pay | Admitting: Family Medicine

## 2017-10-20 ENCOUNTER — Other Ambulatory Visit: Payer: Self-pay | Admitting: Family Medicine

## 2017-10-20 NOTE — Telephone Encounter (Signed)
Will refill #30 but pt is overdue for CPE and needs to schedule

## 2017-10-20 NOTE — Telephone Encounter (Signed)
Last OV 11/03/16 Clonazepam last filled 01/17/17 #30 with 3

## 2018-01-03 ENCOUNTER — Telehealth: Payer: Self-pay | Admitting: Family Medicine

## 2018-01-03 DIAGNOSIS — R0982 Postnasal drip: Secondary | ICD-10-CM

## 2018-01-03 DIAGNOSIS — J309 Allergic rhinitis, unspecified: Secondary | ICD-10-CM

## 2018-01-03 NOTE — Telephone Encounter (Signed)
Copied from CRM [id number]. Topic: Quick Communication - Rx Refill/Question >> Jan 03, 2018  2:16 PM Alexander Bergeron B wrote: Pt called to ask if she can have a medication called in for pink eye instead of coming in for a visit but if a visit is needed contact her

## 2018-01-04 ENCOUNTER — Encounter: Payer: Self-pay | Admitting: Physician Assistant

## 2018-01-04 ENCOUNTER — Ambulatory Visit: Payer: BLUE CROSS/BLUE SHIELD | Admitting: Physician Assistant

## 2018-01-04 ENCOUNTER — Other Ambulatory Visit: Payer: Self-pay

## 2018-01-04 VITALS — BP 122/90 | HR 87 | Temp 98.8°F | Resp 16 | Ht 68.0 in | Wt 264.0 lb

## 2018-01-04 DIAGNOSIS — H1031 Unspecified acute conjunctivitis, right eye: Secondary | ICD-10-CM | POA: Diagnosis not present

## 2018-01-04 DIAGNOSIS — B9689 Other specified bacterial agents as the cause of diseases classified elsewhere: Secondary | ICD-10-CM

## 2018-01-04 DIAGNOSIS — J208 Acute bronchitis due to other specified organisms: Secondary | ICD-10-CM | POA: Diagnosis not present

## 2018-01-04 LAB — POCT RAPID STREP A (OFFICE): RAPID STREP A SCREEN: NEGATIVE

## 2018-01-04 MED ORDER — LIDOCAINE VISCOUS 2 % MT SOLN
15.0000 mL | Freq: Four times a day (QID) | OROMUCOSAL | 0 refills | Status: DC | PRN
Start: 2018-01-04 — End: 2018-04-05

## 2018-01-04 MED ORDER — AZITHROMYCIN 250 MG PO TABS: ORAL_TABLET | ORAL | 0 refills | Status: DC

## 2018-01-04 NOTE — Patient Instructions (Signed)
Take antibiotic (Azithromycin) as directed.  Increase fluids.  Get plenty of rest. Use Mucinex-DM for congestion and cough. Continue your Zyrtec and start OTC Flonase. The viscous lidocaine will help with throat pain. Take a daily probiotic (I recommend Align or Culturelle, but even Activia Yogurt may be beneficial).  A humidifier placed in the bedroom may offer some relief for a dry, scratchy throat of nasal irritation.  Read information below on acute bronchitis. Please call or return to clinic if symptoms are not improving.  Acute Bronchitis Bronchitis is when the airways that extend from the windpipe into the lungs get red, puffy, and painful (inflamed). Bronchitis often causes thick spit (mucus) to develop. This leads to a cough. A cough is the most common symptom of bronchitis. In acute bronchitis, the condition usually begins suddenly and goes away over time (usually in 2 weeks). Smoking, allergies, and asthma can make bronchitis worse. Repeated episodes of bronchitis may cause more lung problems.  HOME CARE  Rest.  Drink enough fluids to keep your pee (urine) clear or pale yellow (unless you need to limit fluids as told by your doctor).  Only take over-the-counter or prescription medicines as told by your doctor.  Avoid smoking and secondhand smoke. These can make bronchitis worse. If you are a smoker, think about using nicotine gum or skin patches. Quitting smoking will help your lungs heal faster.  Reduce the chance of getting bronchitis again by:  Washing your hands often.  Avoiding people with cold symptoms.  Trying not to touch your hands to your mouth, nose, or eyes.  Follow up with your doctor as told.  GET HELP IF: Your symptoms do not improve after 1 week of treatment. Symptoms include:  Cough.  Fever.  Coughing up thick spit.  Body aches.  Chest congestion.  Chills.  Shortness of breath.  Sore throat.  GET HELP RIGHT AWAY IF:   You have an increased  fever.  You have chills.  You have severe shortness of breath.  You have bloody thick spit (sputum).  You throw up (vomit) often.  You lose too much body fluid (dehydration).  You have a severe headache.  You faint.  MAKE SURE YOU:   Understand these instructions.  Will watch your condition.  Will get help right away if you are not doing well or get worse. Document Released: 02/02/2008 Document Revised: 04/18/2013 Document Reviewed: 02/06/2013 [facility] Patient Information 2015 [place], [place]. This information is not intended to replace advice given to you by your health care provider. Make sure you discuss any questions you have with your health care provider.   Bacterial Conjunctivitis Bacterial conjunctivitis is an infection of your conjunctiva. This is the clear membrane that covers the white part of your eye and the inner surface of your eyelid. This condition can make your eye:  Red or pink.  Itchy.  This condition is caused by bacteria. This condition spreads very easily from person to person (is contagious) and from one eye to the other eye. Follow these instructions at home: Medicines  Take or apply your antibiotic medicine as told by your doctor. Do not stop taking or applying the antibiotic even if you start to feel better.  Take or apply over-the-counter and prescription medicines only as told by your doctor.  Do not touch your eyelid with the eye drop bottle or the ointment tube. Managing discomfort  Wipe any fluid from your eye with a warm, wet washcloth or a cotton ball.  Place a  cool, clean washcloth on your eye. Do this for 10-20 minutes, 3-4 times per day. General instructions  Do not wear contact lenses until the irritation is gone. Wear glasses until your doctor says it is okay to wear contacts.  Do not wear eye makeup until your symptoms are gone. Throw away any old makeup.  Change or wash your pillowcase every day.  Do not share towels or  washcloths with anyone.  Wash your hands often with soap and water. Use paper towels to dry your hands.  Do not touch or rub your eyes.  Do not drive or use heavy machinery if your vision is blurry. Contact a doctor if:  You have a fever.  Your symptoms do not get better after 10 days. Get help right away if:  You have a fever and your symptoms suddenly get worse.  You have very bad pain when you move your eye.  Your face: ? Hurts. ? Is red. ? Is swollen.  You have sudden loss of vision. This information is not intended to replace advice given to you by your health care provider. Make sure you discuss any questions you have with your health care provider. Document Released: 05/25/2008 Document Revised: 01/22/2016 Document Reviewed: 05/29/2015 Elsevier Interactive Patient Education  [employer].

## 2018-01-04 NOTE — Telephone Encounter (Signed)
Pt seen for OV on 5/8

## 2018-01-04 NOTE — Telephone Encounter (Signed)
Left message for patient to call back- would like to discuss her symptoms and possible get her into the office.

## 2018-01-04 NOTE — Progress Notes (Signed)
Patient presents to clinic today c/o 1 week of nasal and chest congestion with productive cough. Notes significant sore throat and R ear pain. Denies fever, chest pain or SOB. Patient also notes redness, drainage and crusting of R ear x 2 days. States these symptoms have been fluctuating since onset. Notes the URI symptoms are continuing to worsen. Denies recent travel. Has a friend has been having similar symptoms recently. .   Past Medical History:  Diagnosis Date  . Anxiety   . ASCUS (atypical squamous cells of undetermined significance) on Pap smear 07   NEG HPV; BENIGN CHANGES 4/08  . Chronic back pain   . Claustrophobia    with mask on  . Family history of adverse reaction to anesthesia    daughter Aundra Millet low blood pressure during ear tube surgery 2 years ago  . GERD (gastroesophageal reflux disease)   . Headache    hx migraines once a year or so now  . Sleep apnea    C PAP  . 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 Urinary incontinence    Stress incontinence    Current Outpatient Medications on File Prior to Visit  Medication Sig Dispense Refill  . calcium carbonate (TUMS - DOSED IN MG ELEMENTAL CALCIUM) 500 MG chewable tablet Chew 2 tablets by mouth as needed for indigestion or heartburn.    Marland Kitchen CALCIUM PO Take 1 tablet by mouth daily. When remembers    . cetirizine (ZYRTEC) 10 MG tablet Take 10 mg by mouth daily.    . clonazePAM (KLONOPIN) 1 MG tablet TAKE 1 TABLET BY MOUTH NIGHTLY AT BEDTIME 30 tablet 0  . fluticasone (FLONASE) 50 MCG/ACT nasal spray Place into the nose.    . ibuprofen (ADVIL,MOTRIN) 200 MG tablet Take 800 mg by mouth every 6 (six) hours as needed for moderate pain.    . meloxicam (MOBIC) 15 MG tablet Take 1 tablet (15 mg total) by mouth daily. 15 tablet 0  . Multiple Vitamin (MULTIVITAMIN) tablet Take 1 tablet by mouth daily.    Marland Kitchen omeprazole (PRILOSEC) 20 MG capsule Take 20 mg by mouth daily.    Marland Kitchen venlafaxine XR (EFFEXOR-XR) 150 MG 24  hr capsule TAKE ONE CAPSULE EVERY DAY WITH BREAKFAST 90 capsule 0   No current facility-administered medications on file prior to visit.     Allergies  Allergen Reactions  . Codeine Hives  . Depakote [Divalproex Sodium] Other (See Comments)    blisters    Family History  Problem Relation Age of Onset  . Hypertension Maternal Grandfather   . Heart disease Maternal Grandfather   . Hypertension Paternal Grandmother   . Heart disease Paternal Grandmother   . Hypertension Paternal Grandfather   . Heart disease Paternal Grandfather   . Depression Father   . Heart failure Maternal Grandmother   . Cardiomyopathy Mother     Social History   Socioeconomic History  . Marital status: Married    Spouse name: Not on file  . Number of children: Not on file  . Years of education: Not on file  . Highest education level: Not on file  Occupational History  . Not on file  Social Needs  . Financial resource strain: Not on file  . Food insecurity:    Worry: Not on file    Inability: Not on file  . Transportation needs:    Medical: Not on file    Non-medical: Not on file  Tobacco Use  .  Smoking status: Never Smoker  . Smokeless tobacco: Never Used  Substance and Sexual Activity  . Alcohol use: Yes    Alcohol/week: 0.0 oz    Comment: SOCIALLY  . Drug use: No  . Sexual activity: Yes    Birth control/protection: IUD    Comment: 1st intercourse 47 yo-Fewer than 5 partners Mirena IUD 05/19/2017  Lifestyle  . Physical activity:    Days per week: Not on file    Minutes per session: Not on file  . Stress: Not on file  Relationships  . Social connections:    Talks on phone: Not on file    Gets together: Not on file    Attends religious service: Not on file    Active member of club or organization: Not on file    Attends meetings of clubs or organizations: Not on file    Relationship status: Not on file  Other Topics Concern  . Not on file  Social History Narrative  . Not on file    Review of Systems - See HPI.  All other ROS are negative.  BP 122/90   Pulse 87   Temp 98.8 F (37.1 C) (Oral)   Resp 16   Ht  (1.727 m)   Wt 264 lb (119.7 kg)   SpO2 96%   BMI 40.14 kg/m   Physical Exam  Constitutional: She is oriented to person, place, and time. She appears well-developed and well-nourished.  HENT:  Head: Normocephalic and atraumatic.  Right Ear: A middle ear effusion (serous) is present.  Left Ear: Tympanic membrane normal.  Nose: Nose normal.  Mouth/Throat: Uvula is midline and mucous membranes are normal. No uvula swelling. Posterior oropharyngeal erythema present. No oropharyngeal exudate, posterior oropharyngeal edema or tonsillar abscesses. Tonsils are 2+ on the right. Tonsils are 1+ on the left.  Eyes: Pupils are equal, round, and reactive to light. EOM and lids are normal. Lids are everted and swept, no foreign bodies found. Right eye exhibits discharge. Left eye exhibits no discharge. Right conjunctiva is injected. Right conjunctiva has no hemorrhage. Left conjunctiva is not injected. Left conjunctiva has no hemorrhage.  Neck: Neck supple.  Cardiovascular: Normal rate, regular rhythm and normal heart sounds.  Pulmonary/Chest: Effort normal and breath sounds normal. No stridor. No respiratory distress. She has no wheezes. She has no rales. She exhibits no tenderness.  Neurological: She is alert and oriented to person, place, and time.  Psychiatric: She has a normal mood and affect.  Vitals reviewed.  Recent Results (from the past 2160 hour(s))  POCT Influenza A/B     Status: Normal   Collection Time: 10/07/17  9:11 AM  Result Value Ref Range   Influenza A, POC Negative Negative   Influenza B, POC Negative Negative   Assessment/Plan: 1. Acute bacterial conjunctivitis of right eye Start Azithromycin oral giving concurrent bronchitis. Supportive measures reviewed. Follow-up if not resolving.  - azithromycin (ZITHROMAX) 250 MG tablet; Take 2  tablets on Day 1. Then take 1 tablet daily.  Dispense: 6 tablet; Refill: 0  2. Acute bacterial bronchitis Rapid strep negative. Start Azithromycin. Rx viscous lidocaine for severe sore throat. Supportive measures and OTC medications discussed. - POCT rapid strep A - azithromycin (ZITHROMAX) 250 MG tablet; Take 2 tablets on Day 1. Then take 1 tablet daily.  Dispense: 6 tablet; Refill: 0 - lidocaine (XYLOCAINE) 2 % solution; Use as directed 15 mLs in the mouth or throat every 6 (six) hours as needed for mouth pain.  Dispense:  100 mL; Refill: 0   Piedad Climes, [place]

## 2018-01-11 ENCOUNTER — Other Ambulatory Visit: Payer: Self-pay | Admitting: Family Medicine

## 2018-01-11 NOTE — Telephone Encounter (Signed)
Last OV 01/04/18 (conjunctivitis) Clonazepam last filled 10/20/17 #30 with 0

## 2018-01-15 ENCOUNTER — Other Ambulatory Visit: Payer: Self-pay | Admitting: Family Medicine

## 2018-01-19 NOTE — Telephone Encounter (Signed)
Please advise, pt seen 01/04/18  Copied from CRM #[id number]. Topic: General - Other >> Jan 19, 2018  2:06 PM Percival Spanish wrote:  Pt said she saw the doctor 2 weeks ago and still have sore throat and is asking if there is something else she need to try  Pharmacy [facility] at [place]

## 2018-01-20 MED ORDER — FEXOFENADINE HCL 180 MG PO TABS: 180.0000 mg | ORAL_TABLET | Freq: Every day | ORAL | Status: DC

## 2018-01-20 MED ORDER — AZELASTINE-FLUTICASONE 137-50 MCG/ACT NA SUSP
1.0000 | Freq: Two times a day (BID) | NASAL | 3 refills | Status: DC
Start: 2018-01-20 — End: 2018-04-05

## 2018-01-20 NOTE — Telephone Encounter (Signed)
Please call patient to assess the spectrum of her current symptoms so we can help her further.   Is it just sore throat? Fever, chills. Any recurrent eye symptoms of productive cough?

## 2018-01-20 NOTE — Telephone Encounter (Signed)
Advised patient to switch her Zyrtec to [place] daily. Stop the Flonase and start Dymista. Rx sent to the pharmacy. If sxs are not improved then to schedule an appt for follow up. She is agreeable.

## 2018-01-20 NOTE — Telephone Encounter (Signed)
Patient states she is having sore throat, postnasal drainage, earache.  She finished the Zpak and Lidocaine helped a little but bad taste Continued the Flonase and Zyrtec Denies any fever or chills or cough. Her eye did improved  Please advise

## 2018-01-20 NOTE — Telephone Encounter (Signed)
Sounds like allergic inflammation and PND 2/2 that inflammation.  Switch from the Zytrec to an Allegra. Continue saline nasal spray. Stop Flonase. Send in Rx Dymista for her to use instead -- 1 spray each nostril BID. Follow-up with me in office next week if symptoms are not resolving as we will need reassessment.

## 2018-01-26 ENCOUNTER — Encounter: Payer: Self-pay | Admitting: Physician Assistant

## 2018-01-26 ENCOUNTER — Ambulatory Visit: Payer: BLUE CROSS/BLUE SHIELD | Admitting: Physician Assistant

## 2018-01-26 ENCOUNTER — Other Ambulatory Visit: Payer: Self-pay

## 2018-01-26 VITALS — BP 126/80 | HR 95 | Temp 98.2°F | Resp 16 | Ht 68.0 in | Wt 265.0 lb

## 2018-01-26 DIAGNOSIS — J9801 Acute bronchospasm: Secondary | ICD-10-CM | POA: Diagnosis not present

## 2018-01-26 DIAGNOSIS — H1011 Acute atopic conjunctivitis, right eye: Secondary | ICD-10-CM

## 2018-01-26 DIAGNOSIS — J029 Acute pharyngitis, unspecified: Secondary | ICD-10-CM | POA: Diagnosis not present

## 2018-01-26 LAB — POCT RAPID STREP A (OFFICE): RAPID STREP A SCREEN: NEGATIVE

## 2018-01-26 MED ORDER — MONTELUKAST SODIUM 10 MG PO TABS: 10.0000 mg | ORAL_TABLET | Freq: Every day | ORAL | 3 refills | Status: DC

## 2018-01-26 MED ORDER — AZELASTINE HCL 0.05 % OP SOLN: 1.0000 [drp] | Freq: Two times a day (BID) | OPHTHALMIC | 1 refills | Status: DC

## 2018-01-26 MED ORDER — PREDNISONE 10 MG PO TABS: 30.0000 mg | ORAL_TABLET | Freq: Every day | ORAL | 0 refills | Status: DC

## 2018-01-26 NOTE — Progress Notes (Signed)
Patient presents to clinic today c/o continued cough and chest tightness after treatment for bacterial bronchitis, allergic rhinitis and bacterial conjunctivitis. At last visit patient was put on a regimen of Azithromycin. Her allergy regimen was changed to Dymista from Flonase to help with PND. Has completed entire course of antibiotic. Notes symptoms are much better but the cough that is now dry is constant and causing chest tightness and SOB during coughing spells. Is still noting nasal drainage and sore throat bilaterally. Denies fever, chills, chest pain. Denies sinus pain, ear pain or tooth pain.  Past Medical History:  Diagnosis Date  . Anxiety   . ASCUS (atypical squamous cells of undetermined significance) on Pap smear 07   NEG HPV; BENIGN CHANGES 4/08  . Chronic back pain   . Claustrophobia    with mask on  . Family history of adverse reaction to anesthesia    daughter Aundra Millet low blood pressure during ear tube surgery 2 years ago  . GERD (gastroesophageal reflux disease)   . Headache    hx migraines once a year or so now  . Sleep apnea    C PAP  . 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 Urinary incontinence    Stress incontinence    Current Outpatient Medications on File Prior to Visit  Medication Sig Dispense Refill  . Azelastine-Fluticasone (DYMISTA) 137-50 MCG/ACT SUSP Place 1 spray into the nose 2 (two) times daily. 23 g 3  . calcium carbonate (TUMS - DOSED IN MG ELEMENTAL CALCIUM) 500 MG chewable tablet Chew 2 tablets by mouth as needed for indigestion or heartburn.    Marland Kitchen CALCIUM PO Take 1 tablet by mouth daily. When remembers    . clonazePAM (KLONOPIN) 1 MG tablet TAKE 1 TABLET BY MOUTH EVERYDAY AT BEDTIME 30 tablet 0  . fexofenadine (ALLEGRA ALLERGY) 180 MG tablet Take 1 tablet (180 mg total) by mouth daily. 30 tablet   . ibuprofen (ADVIL,MOTRIN) 200 MG tablet Take 800 mg by mouth every 6 (six) hours as needed for moderate pain.    . meloxicam  (MOBIC) 15 MG tablet Take 1 tablet (15 mg total) by mouth daily. 15 tablet 0  . Multiple Vitamin (MULTIVITAMIN) tablet Take 1 tablet by mouth daily.    Marland Kitchen omeprazole (PRILOSEC) 20 MG capsule Take 20 mg by mouth daily.    Marland Kitchen venlafaxine XR (EFFEXOR-XR) 150 MG 24 hr capsule TAKE ONE CAPSULE EVERY DAY WITH BREAKFAST 90 capsule 0  . cetirizine (ZYRTEC) 10 MG tablet Take 10 mg by mouth daily.    Marland Kitchen lidocaine (XYLOCAINE) 2 % solution Use as directed 15 mLs in the mouth or throat every 6 (six) hours as needed for mouth pain. (Patient not taking: Reported on 01/26/2018) 100 mL 0   No current facility-administered medications on file prior to visit.     Allergies  Allergen Reactions  . Codeine Hives  . Depakote [Divalproex Sodium] Other (See Comments)    blisters    Family History  Problem Relation Age of Onset  . Hypertension Maternal Grandfather   . Heart disease Maternal Grandfather   . Hypertension Paternal Grandmother   . Heart disease Paternal Grandmother   . Hypertension Paternal Grandfather   . Heart disease Paternal Grandfather   . Depression Father   . Heart failure Maternal Grandmother   . Cardiomyopathy Mother     Social History   Socioeconomic History  . Marital status: Married    Spouse name: Not  on file  . Number of children: Not on file  . Years of education: Not on file  . Highest education level: Not on file  Occupational History  . Not on file  Social Needs  . Financial resource strain: Not on file  . Food insecurity:    Worry: Not on file    Inability: Not on file  . Transportation needs:    Medical: Not on file    Non-medical: Not on file  Tobacco Use  . Smoking status: Never Smoker  . Smokeless tobacco: Never Used  Substance and Sexual Activity  . Alcohol use: Yes    Alcohol/week: 0.0 oz    Comment: SOCIALLY  . Drug use: No  . Sexual activity: Yes    Birth control/protection: IUD    Comment: 1st intercourse 47 yo-Fewer than 5 partners Mirena IUD  05/19/2017  Lifestyle  . Physical activity:    Days per week: Not on file    Minutes per session: Not on file  . Stress: Not on file  Relationships  . Social connections:    Talks on phone: Not on file    Gets together: Not on file    Attends religious service: Not on file    Active member of club or organization: Not on file    Attends meetings of clubs or organizations: Not on file    Relationship status: Not on file  Other Topics Concern  . Not on file  Social History Narrative  . Not on file   Review of Systems - See HPI.  All other ROS are negative.  BP 126/80   Pulse 95   Temp 98.2 F (36.8 C) (Oral)   Resp 16   Ht  (1.727 m)   Wt 265 lb (120.2 kg)   SpO2 98%   BMI 40.29 kg/m   Physical Exam  Constitutional: She appears well-developed and well-nourished.  HENT:  Head: Normocephalic and atraumatic.  Right Ear: Tympanic membrane is retracted. A middle ear effusion (serous) is present.  Left Ear: A middle ear effusion (serous) is present.  Nose: Nose normal.  Mouth/Throat: Uvula is midline and mucous membranes are normal. Posterior oropharyngeal edema and posterior oropharyngeal erythema present. No oropharyngeal exudate or tonsillar abscesses.  Eyes: Conjunctivae are normal.  Cardiovascular: Normal rate, regular rhythm and normal heart sounds.  Pulmonary/Chest: Effort normal and breath sounds normal. No stridor. No respiratory distress. She has no wheezes. She has no rales. She exhibits no tenderness.  Lymphadenopathy:    She has no cervical adenopathy.  Psychiatric: She has a normal mood and affect.  Vitals reviewed.  Recent Results (from the past 2160 hour(s))  POCT rapid strep A     Status: Normal   Collection Time: 01/04/18  4:08 PM  Result Value Ref Range   Rapid Strep A Screen Negative Negative   Assessment/Plan: 1. Allergic conjunctivitis of right eye Will Rx Optivar to use as directed. Continue antihistamines.  - azelastine (OPTIVAR) 0.05 %  ophthalmic solution; Place 1 drop into the right eye 2 (two) times daily.  Dispense: 6 mL; Refill: 1  2. Bronchospasm S/p infection. Also with significant allergic rhinitis and questionable allergic reactive airway. Will start 30 mg Prednisone burst to calm things down. Add on a trial of singulair. - montelukast (SINGULAIR) 10 MG tablet; Take 1 tablet (10 mg total) by mouth at bedtime.  Dispense: 30 tablet; Refill: 3  3. Sore throat Strep test negative. Seems secondary to continued pnd and allergic inflammation.  Continue antihistamine and Dymista. Will add on Singulair. Short course of steroid given for bronchospasm should also help. If not improving at this point, will need ENT assessment.  - POCT rapid strep A   Piedad Climes, PA-C

## 2018-01-26 NOTE — Patient Instructions (Addendum)
Please keep well-hydrated.  Start the Singulair daily which will help with cough and drainage.  Continue the Dymista.  Start the Steroid burst to help calm the bronchospasm down and to help open the eustachian tubes.   Delsym for cough.  If not resolving, we will need assessment by ENT.

## 2018-03-04 ENCOUNTER — Other Ambulatory Visit: Payer: Self-pay | Admitting: Family Medicine

## 2018-04-05 ENCOUNTER — Encounter: Payer: Self-pay | Admitting: Physician Assistant

## 2018-04-05 ENCOUNTER — Other Ambulatory Visit: Payer: Self-pay

## 2018-04-05 ENCOUNTER — Ambulatory Visit: Payer: BLUE CROSS/BLUE SHIELD | Admitting: Physician Assistant

## 2018-04-05 VITALS — BP 126/86 | HR 96 | Temp 97.8°F | Resp 17 | Ht 68.0 in | Wt 264.0 lb

## 2018-04-05 DIAGNOSIS — M5430 Sciatica, unspecified side: Secondary | ICD-10-CM

## 2018-04-05 DIAGNOSIS — J029 Acute pharyngitis, unspecified: Secondary | ICD-10-CM

## 2018-04-05 LAB — POCT RAPID STREP A (OFFICE): Rapid Strep A Screen: NEGATIVE

## 2018-04-05 MED ORDER — FEXOFENADINE-PSEUDOEPHED ER 60-120 MG PO TB12: 1.0000 | ORAL_TABLET | Freq: Two times a day (BID) | ORAL | 0 refills | Status: DC

## 2018-04-05 MED ORDER — PREDNISONE 10 MG (21) PO TBPK: ORAL_TABLET | ORAL | 0 refills | Status: DC

## 2018-04-05 MED ORDER — VENLAFAXINE HCL ER 150 MG PO CP24: ORAL_CAPSULE | ORAL | 0 refills | Status: DC

## 2018-04-05 MED ORDER — CLONAZEPAM 1 MG PO TABS: ORAL_TABLET | ORAL | 0 refills | Status: DC

## 2018-04-05 NOTE — Addendum Note (Signed)
Addended by: Waldon MerlMARTIN, Elio Haden C on: 04/05/2018 11:42 AM   Modules accepted: Orders

## 2018-04-05 NOTE — Progress Notes (Signed)
Patient presents to clinic today to discuss multiple issues.  Patient with history of chronic low back pain with intermittent sciatica. No current flare up. Is managing with OTC Ibuprofen when needed. Is more prone to sciatic nerve involvement with prolonged immobilization (especially sitting). She is about to go to [place] for 10-days and is very concerned about the possibility of a flare-up. Is wondering if she can be given an Rx to have in case of a flare. Has previously responded well to steroid taper during acute flares.  Patient also with ongoing intermittent scratchy throat and nasal drainage. At last visit was placed on azelastine and flonase along with OTC antihistamine. States she was unable to tolerate the nasal sprays and has just been taking Allegra with some improvement. Denies any new or worsening symptoms. ENT previously recommended if symptoms are not improving. Would like to set this up when she returns from vacation.  Past Medical History:  Diagnosis Date  . Anxiety   . ASCUS (atypical squamous cells of undetermined significance) on Pap smear 07   NEG HPV; BENIGN CHANGES 4/08  . Chronic back pain   . Claustrophobia    with mask on  . Family history of adverse reaction to anesthesia    daughter Aundra Millet low blood pressure during ear tube surgery 2 years ago  . GERD (gastroesophageal reflux disease)   . Headache    hx migraines once a year or so now  . Sleep apnea    C PAP  . 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 Urinary incontinence    Stress incontinence    Current Outpatient Medications on File Prior to Visit  Medication Sig Dispense Refill  . calcium carbonate (TUMS - DOSED IN MG ELEMENTAL CALCIUM) 500 MG chewable tablet Chew 2 tablets by mouth as needed for indigestion or heartburn.    Marland Kitchen CALCIUM PO Take 1 tablet by mouth daily. When remembers    . ibuprofen (ADVIL,MOTRIN) 200 MG tablet Take 800 mg by mouth every 6 (six) hours as needed for  moderate pain.    . Multiple Vitamin (MULTIVITAMIN) tablet Take 1 tablet by mouth daily.    Marland Kitchen omeprazole (PRILOSEC) 20 MG capsule Take 20 mg by mouth daily.    Marland Kitchen azelastine (OPTIVAR) 0.05 % ophthalmic solution Place 1 drop into the right eye 2 (two) times daily. (Patient not taking: Reported on 04/05/2018) 6 mL 1  . cetirizine (ZYRTEC) 10 MG tablet Take 10 mg by mouth daily.     No current facility-administered medications on file prior to visit.     Allergies  Allergen Reactions  . Codeine Hives  . Depakote [Divalproex Sodium] Other (See Comments)    blisters    Family History  Problem Relation Age of Onset  . Hypertension Maternal Grandfather   . Heart disease Maternal Grandfather   . Hypertension Paternal Grandmother   . Heart disease Paternal Grandmother   . Hypertension Paternal Grandfather   . Heart disease Paternal Grandfather   . Depression Father   . Heart failure Maternal Grandmother   . Cardiomyopathy Mother     Social History   Socioeconomic History  . Marital status: Married    Spouse name: Not on file  . Number of children: Not on file  . Years of education: Not on file  . Highest education level: Not on file  Occupational History  . Not on file  Social Needs  . Financial resource strain: Not on  file  . Food insecurity:    Worry: Not on file    Inability: Not on file  . Transportation needs:    Medical: Not on file    Non-medical: Not on file  Tobacco Use  . Smoking status: Never Smoker  . Smokeless tobacco: Never Used  Substance and Sexual Activity  . Alcohol use: Yes    Alcohol/week: 0.0 oz    Comment: SOCIALLY  . Drug use: No  . Sexual activity: Yes    Birth control/protection: IUD    Comment: 1st intercourse 47 yo-Fewer than 5 partners Mirena IUD 05/19/2017  Lifestyle  . Physical activity:    Days per week: Not on file    Minutes per session: Not on file  . Stress: Not on file  Relationships  . Social connections:    Talks on phone: Not  on file    Gets together: Not on file    Attends religious service: Not on file    Active member of club or organization: Not on file    Attends meetings of clubs or organizations: Not on file    Relationship status: Not on file  Other Topics Concern  . Not on file  Social History Narrative  . Not on file   Review of Systems - See HPI.  All other ROS are negative.  BP 126/86   Pulse 96   Temp 97.8 F (36.6 C) (Oral)   Resp 17   Ht 5\' 8"  (1.727 m)   Wt 264 lb (119.7 kg)   SpO2 96%   BMI 40.14 kg/m   Physical Exam  Constitutional: She appears well-developed and well-nourished.  HENT:  Head: Normocephalic and atraumatic.  Right Ear: A middle ear effusion (serous) is present.  Left Ear: Tympanic membrane normal.  Mouth/Throat: Uvula is midline, oropharynx is clear and moist and mucous membranes are normal.  Eyes: Pupils are equal, round, and reactive to light.  Neck: Neck supple.  Cardiovascular: Normal rate, regular rhythm and normal heart sounds.  Pulmonary/Chest: Effort normal.  Lymphadenopathy:    She has no cervical adenopathy.  Psychiatric: She has a normal mood and affect.  Vitals reviewed.  Recent Results (from the past 2160 hour(s))  POCT rapid strep A     Status: Normal   Collection Time: 01/26/18  9:42 AM  Result Value Ref Range   Rapid Strep A Screen Negative Negative  POCT rapid strep A     Status: Normal   Collection Time: 04/05/18  9:45 AM  Result Value Ref Range   Rapid Strep A Screen Negative Negative   Assessment/Plan: 1. Sore throat Rapid strep negative. + nasal congestion. Seems related to chronic PND. Unable to tolerate nasal sprays. Will start Allegra-D. Supportive measures reviewed. ENT referral placed.. - POCT rapid strep A  2. Sciatica, unspecified laterality History of exacerbation. None currently. Supportive measures and OTC medications reviewed. Stretches recommended and handout given. Will give prednisone taper pack to have on hand in  case of flare while on vacation.   Piedad ClimesWilliam Cody Agustine Rossitto, PA-C

## 2018-04-05 NOTE — Patient Instructions (Signed)
Please stay well-hydrated and get plenty of rest. Since you can't tolerate nasal sprays, start the Allegra D (I have sent in a prescription), taking in the mornings to help with symptoms. I am setting you up with ENT for further assessment. You can schedule with them when you return from your trip.  Ok to take your Klonopin on the plane ride.  I have sent in refills.  Alternate tylenol and ibuprofen for back pain when needed. Start the stretches below. I have printed out a prescription for you for a steroid pack to have on hand in case your sciatica flares while you are gone.  Once you return we will be setting you up with Cardiology giving mother's cardiac history, to make sure you have no risk factors. We should also consider setting you up with Physical Therapy.  Please call me when you return so I can get things set up.   Sciatica Rehab Ask your health care provider which exercises are safe for you. Do exercises exactly as told by your health care provider and adjust them as directed. It is normal to feel mild stretching, pulling, tightness, or discomfort as you do these exercises, but you should stop right away if you feel sudden pain or your pain gets worse.Do not begin these exercises until told by your health care provider. Stretching and range of motion exercises These exercises warm up your muscles and joints and improve the movement and flexibility of your hips and your back. These exercises also help to relieve pain, numbness, and tingling. Exercise A: Sciatic nerve glide 1. Sit in a chair with your head facing down toward your chest. Place your hands behind your back. Let your shoulders slump forward. 2. Slowly straighten one of your knees while you tilt your head back as if you are looking toward the ceiling. Only straighten your leg as far as you can without making your symptoms worse. 3. Hold for __________ seconds. 4. Slowly return to the starting position. 5. Repeat with  your other leg. Repeat __________ times. Complete this exercise __________ times a day. Exercise B: Knee to chest with hip adduction and internal rotation  1. Lie on your back on a firm surface with both legs straight. 2. Bend one of your knees and move it up toward your chest until you feel a gentle stretch in your lower back and buttock. Then, move your knee toward the shoulder that is on the opposite side from your leg. ? Hold your leg in this position by holding onto the front of your knee. 3. Hold for __________ seconds. 4. Slowly return to the starting position. 5. Repeat with your other leg. Repeat __________ times. Complete this exercise __________ times a day. Exercise C: Prone extension on elbows  1. Lie on your abdomen on a firm surface. A bed may be too soft for this exercise. 2. Prop yourself up on your elbows. 3. Use your arms to help lift your chest up until you feel a gentle stretch in your abdomen and your lower back. ? This will place some of your body weight on your elbows. If this is uncomfortable, try stacking pillows under your chest. ? Your hips should stay down, against the surface that you are lying on. Keep your hip and back muscles relaxed. 4. Hold for __________ seconds. 5. Slowly relax your upper body and return to the starting position. Repeat __________ times. Complete this exercise __________ times a day. Strengthening exercises These exercises build strength and endurance  in your back. Endurance is the ability to use your muscles for a long time, even after they get tired. Exercise D: Pelvic tilt 1. Lie on your back on a firm surface. Bend your knees and keep your feet flat. 2. Tense your abdominal muscles. Tip your pelvis up toward the ceiling and flatten your lower back into the floor. ? To help with this exercise, you may place a small towel under your lower back and try to push your back into the towel. 3. Hold for __________ seconds. 4. Let your  muscles relax completely before you repeat this exercise. Repeat __________ times. Complete this exercise __________ times a day. Exercise E: Alternating arm and leg raises  1. Get on your hands and knees on a firm surface. If you are on a hard floor, you may want to use padding to cushion your knees, such as an exercise mat. 2. Line up your arms and legs. Your hands should be below your shoulders, and your knees should be below your hips. 3. Lift your left leg behind you. At the same time, raise your right arm and straighten it in front of you. ? Do not lift your leg higher than your hip. ? Do not lift your arm higher than your shoulder. ? Keep your abdominal and back muscles tight. ? Keep your hips facing the ground. ? Do not arch your back. ? Keep your balance carefully, and do not hold your breath. 4. Hold for __________ seconds. 5. Slowly return to the starting position and repeat with your right leg and your left arm. Repeat __________ times. Complete this exercise __________ times a day. Posture and body mechanics  Body mechanics refers to the movements and positions of your body while you do your daily activities. Posture is part of body mechanics. Good posture and healthy body mechanics can help to relieve stress in your body's tissues and joints. Good posture means that your spine is in its natural S-curve position (your spine is neutral), your shoulders are pulled back slightly, and your head is not tipped forward. The following are general guidelines for applying improved posture and body mechanics to your everyday activities. Standing   When standing, keep your spine neutral and your feet about hip-width apart. Keep a slight bend in your knees. Your ears, shoulders, and hips should line up.  When you do a task in which you stand in one place for a long time, place one foot up on a stable object that is 2-4 inches (5-10 cm) high, such as a footstool. This helps keep your spine  neutral. Sitting   When sitting, keep your spine neutral and keep your feet flat on the floor. Use a footrest, if necessary, and keep your thighs parallel to the floor. Avoid rounding your shoulders, and avoid tilting your head forward.  When working at a desk or a computer, keep your desk at a height where your hands are slightly lower than your elbows. Slide your chair under your desk so you are close enough to maintain good posture.  When working at a computer, place your monitor at a height where you are looking straight ahead and you do not have to tilt your head forward or downward to look at the screen. Resting   When lying down and resting, avoid positions that are most painful for you.  If you have pain with activities such as sitting, bending, stooping, or squatting (flexion-based activities), lie in a position in which your body does  not bend very much. For example, avoid curling up on your side with your arms and knees near your chest (fetal position).  If you have pain with activities such as standing for a long time or reaching with your arms (extension-based activities), lie with your spine in a neutral position and bend your knees slightly. Try the following positions: ? Lying on your side with a pillow between your knees. ? Lying on your back with a pillow under your knees. Lifting   When lifting objects, keep your feet at least shoulder-width apart and tighten your abdominal muscles.  Bend your knees and hips and keep your spine neutral. It is important to lift using the strength of your legs, not your back. Do not lock your knees straight out.  Always ask for help to lift heavy or awkward objects. This information is not intended to replace advice given to you by your health care provider. Make sure you discuss any questions you have with your health care provider. Document Released: 08/16/2005 Document Revised: 04/22/2016 Document Reviewed: 05/02/2015 Elsevier  Interactive Patient Education  [employer]2018 [employer]

## 2018-04-24 ENCOUNTER — Other Ambulatory Visit: Payer: Self-pay | Admitting: Physician Assistant

## 2018-04-24 DIAGNOSIS — J9801 Acute bronchospasm: Secondary | ICD-10-CM

## 2018-04-25 NOTE — Telephone Encounter (Signed)
Last OV 04/05/18, No future OV  Last filled 01/26/18, # 30 with 3 refills  This medication is not current on the medication list and not sure if okay that they take again? I see the last Rx for Allegra-D.  Please advise if okay to fill?

## 2018-06-15 ENCOUNTER — Other Ambulatory Visit: Payer: Self-pay | Admitting: Physician Assistant

## 2018-06-16 ENCOUNTER — Encounter: Payer: Self-pay | Admitting: General Practice

## 2018-06-16 NOTE — Telephone Encounter (Signed)
Letter mailed to pt to inform  

## 2018-06-16 NOTE — Telephone Encounter (Signed)
Will refill but pt needs to schedule CPE ?

## 2018-06-16 NOTE — Telephone Encounter (Signed)
Last Filled: 04/05/18 #30, 0 Last OV:   Only acute visits over the last year.

## 2018-06-29 ENCOUNTER — Other Ambulatory Visit: Payer: Self-pay | Admitting: Physician Assistant

## 2018-08-03 ENCOUNTER — Other Ambulatory Visit: Payer: Self-pay | Admitting: Emergency Medicine

## 2018-08-03 NOTE — Telephone Encounter (Signed)
Last rx 06/16/18 #30

## 2018-08-04 MED ORDER — CLONAZEPAM 1 MG PO TABS: 1.0000 mg | ORAL_TABLET | Freq: Every day | ORAL | 0 refills | Status: DC

## 2018-08-11 ENCOUNTER — Encounter: Payer: Self-pay | Admitting: Family Medicine

## 2018-08-11 ENCOUNTER — Ambulatory Visit: Payer: BLUE CROSS/BLUE SHIELD | Admitting: Family Medicine

## 2018-08-11 ENCOUNTER — Encounter: Payer: Self-pay | Admitting: General Practice

## 2018-08-11 ENCOUNTER — Other Ambulatory Visit: Payer: Self-pay

## 2018-08-11 VITALS — BP 125/84 | HR 93 | Temp 98.4°F | Resp 16 | Ht 68.0 in | Wt 271.2 lb

## 2018-08-11 DIAGNOSIS — G2581 Restless legs syndrome: Secondary | ICD-10-CM

## 2018-08-11 DIAGNOSIS — Z23 Encounter for immunization: Secondary | ICD-10-CM

## 2018-08-11 DIAGNOSIS — F418 Other specified anxiety disorders: Secondary | ICD-10-CM | POA: Diagnosis not present

## 2018-08-11 LAB — BASIC METABOLIC PANEL
BUN: 15 mg/dL (ref 6–23)
CALCIUM: 9.2 mg/dL (ref 8.4–10.5)
CO2: 27 mEq/L (ref 19–32)
CREATININE: 0.68 mg/dL (ref 0.40–1.20)
Chloride: 102 mEq/L (ref 96–112)
GFR: 99.29 mL/min (ref >60.00)
GLUCOSE: 104 mg/dL — AB (ref 70–99)
POTASSIUM: 3.7 meq/L (ref 3.5–5.1)
Sodium: 139 mEq/L (ref 135–145)

## 2018-08-11 LAB — CBC WITH DIFFERENTIAL/PLATELET
BASOS ABS: 0 10*3/uL (ref 0.0–0.1)
Basophils Relative: 0.6 % (ref 0.0–3.0)
Eosinophils Absolute: 0.2 10*3/uL (ref 0.0–0.7)
Eosinophils Relative: 3.1 % (ref 0.0–5.0)
HCT: 39.5 % (ref 36.0–46.0)
Hemoglobin: 13 g/dL (ref 12.0–15.0)
LYMPHS PCT: 26.5 % (ref 12.0–46.0)
Lymphs Abs: 1.7 10*3/uL (ref 0.7–4.0)
MCHC: 33.1 g/dL (ref 30.0–36.0)
MCV: 84.4 fl (ref 78.0–100.0)
Monocytes Absolute: 0.3 10*3/uL (ref 0.1–1.0)
Monocytes Relative: 5.1 % (ref 3.0–12.0)
NEUTROS ABS: 4.1 10*3/uL (ref 1.4–7.7)
Neutrophils Relative %: 64.7 % (ref 43.0–77.0)
Platelets: 185 10*3/uL (ref 150.0–400.0)
RBC: 4.68 Mil/uL (ref 3.87–5.11)
RDW: 14.3 % (ref 11.5–15.5)
WBC: 6.4 10*3/uL (ref 4.0–10.5)

## 2018-08-11 LAB — LIPID PANEL
CHOL/HDL RATIO: 3
CHOLESTEROL: 171 mg/dL (ref 0–200)
HDL: 52.1 mg/dL (ref >39.00)
LDL CALC: 83 mg/dL (ref 0–99)
NonHDL: 119
TRIGLYCERIDES: 180 mg/dL — AB (ref 0.0–149.0)
VLDL: 36 mg/dL (ref 0.0–40.0)

## 2018-08-11 LAB — HEPATIC FUNCTION PANEL
ALK PHOS: 79 U/L (ref 39–117)
ALT: 23 U/L (ref 0–35)
AST: 19 U/L (ref 0–37)
Albumin: 4.3 g/dL (ref 3.5–5.2)
Bilirubin, Direct: 0.1 mg/dL (ref 0.0–0.3)
TOTAL PROTEIN: 7.3 g/dL (ref 6.0–8.3)
Total Bilirubin: 0.3 mg/dL (ref 0.2–1.2)

## 2018-08-11 LAB — TSH: TSH: 1.03 u[IU]/mL (ref 0.35–4.50)

## 2018-08-11 LAB — HEMOGLOBIN A1C: Hgb A1c MFr Bld: 5.9 % (ref 4.6–6.5)

## 2018-08-11 MED ORDER — BUPROPION HCL ER (XL) 150 MG PO TB24: 150.0000 mg | ORAL_TABLET | Freq: Every day | ORAL | 3 refills | Status: DC

## 2018-08-11 NOTE — Patient Instructions (Addendum)
Follow up in 1 month to recheck mood We'll notify you of your lab results and make any changes if needed START the Bupropion (Wellbutrin) daily in the AM CONTINUE the Venlafaxine (Effexor) daily CONSIDER grief counseling The 'normal' grief process can take a year or more.  Be kind to yourself! Call with any questions or concerns Hang in there! Happy Holidays!

## 2018-08-11 NOTE — Progress Notes (Signed)
   Subjective:    Patient ID: Norma [name], female    DOB: 10/25/1970, 47 y.o.   MRN: [id number]  HPI Anxiety/Depression- chronic problem, on Venlafaxine 150mg  daily.  Pt immediately became tearful in the office.  Mom passed in June.  'I just can't shake the sadness'.  Wanting to sleep a lot, stress eating.  Withdrawing from friends.  Has not done grief counseling.  Oldest daughter is asking her regularly if she's ok.  Husband is very critical of weight gain.  Obesity- pt has gained 7 lbs.  BMI is now 41.24  She feels that weight is spiraling out of control.  Not always wearing CPAP.  RLS- Clonazepam is helping nightly.   Review of Systems For ROS see HPI     Objective:   Physical Exam Vitals signs reviewed.  Constitutional:      General: She is not in acute distress.    Appearance: She is well-developed.  HENT:     Head: Normocephalic and atraumatic.  Eyes:     Conjunctiva/sclera: Conjunctivae normal.     Pupils: Pupils are equal, round, and reactive to light.  Neck:     Musculoskeletal: Normal range of motion and neck supple.     Thyroid: No thyromegaly.  Cardiovascular:     Rate and Rhythm: Normal rate and regular rhythm.     Heart sounds: Normal heart sounds. No murmur.  Pulmonary:     Effort: Pulmonary effort is normal. No respiratory distress.     Breath sounds: Normal breath sounds.  Abdominal:     General: There is no distension.     Palpations: Abdomen is soft.     Tenderness: There is no abdominal tenderness.  Lymphadenopathy:     Cervical: No cervical adenopathy.  Skin:    General: Skin is warm and dry.  Neurological:     Mental Status: She is alert and oriented to person, place, and time.  Psychiatric:     Comments: Tearful, anxious           Assessment & Plan:
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
## 2018-08-11 NOTE — Assessment & Plan Note (Signed)
Currently well controlled on Clonazepam.

## 2018-08-11 NOTE — Assessment & Plan Note (Signed)
Deteriorated.  Pt admits to stress eating and not being able to stop- even when full- b/c 'it's the only thing I can control right now'.  Will start Wellbutrin in hopes of managing anxiety and curbing her appetite.  Check labs to risk stratify.  Will follow closely.

## 2018-08-12 ENCOUNTER — Other Ambulatory Visit: Payer: Self-pay | Admitting: Physician Assistant

## 2018-08-12 DIAGNOSIS — J9801 Acute bronchospasm: Secondary | ICD-10-CM

## 2018-09-02 ENCOUNTER — Other Ambulatory Visit: Payer: Self-pay | Admitting: Family Medicine

## 2018-09-10 ENCOUNTER — Other Ambulatory Visit: Payer: Self-pay | Admitting: Family Medicine

## 2018-09-11 ENCOUNTER — Other Ambulatory Visit: Payer: Self-pay | Admitting: General Practice

## 2018-09-11 MED ORDER — VENLAFAXINE HCL ER 150 MG PO CP24: ORAL_CAPSULE | ORAL | 0 refills | Status: DC

## 2018-09-11 NOTE — Telephone Encounter (Signed)
Last OV 04/05/18 Clonazepam last filled 08/04/18 #30 with 0

## 2018-09-13 ENCOUNTER — Ambulatory Visit: Payer: BLUE CROSS/BLUE SHIELD | Admitting: Family Medicine

## 2018-09-13 ENCOUNTER — Other Ambulatory Visit: Payer: Self-pay

## 2018-09-13 ENCOUNTER — Encounter: Payer: Self-pay | Admitting: Family Medicine

## 2018-09-13 VITALS — BP 122/80 | HR 76 | Temp 98.1°F | Resp 16 | Ht 68.0 in | Wt 260.4 lb

## 2018-09-13 DIAGNOSIS — G4733 Obstructive sleep apnea (adult) (pediatric): Secondary | ICD-10-CM

## 2018-09-13 DIAGNOSIS — F418 Other specified anxiety disorders: Secondary | ICD-10-CM | POA: Diagnosis not present

## 2018-09-13 DIAGNOSIS — Z8249 Family history of ischemic heart disease and other diseases of the circulatory system: Secondary | ICD-10-CM

## 2018-09-13 DIAGNOSIS — Z23 Encounter for immunization: Secondary | ICD-10-CM

## 2018-09-13 MED ORDER — BUPROPION HCL ER (XL) 300 MG PO TB24: 300.0000 mg | ORAL_TABLET | Freq: Every day | ORAL | 3 refills | Status: DC

## 2018-09-13 NOTE — Assessment & Plan Note (Signed)
Mom recently passed away suddenly.  Pt was told to see cards to have workup done.  Referral placed.

## 2018-09-13 NOTE — Patient Instructions (Signed)
Follow up in 6-8 weeks to recheck mood CONTINUE the Effexor once daily INCREASE the Wellbutrin to 300mg  daily- 2 of what you have at home and 1 of the new prescription Call and schedule grief counseling w/ Hospice [phone] [facility] call you with your cardiology and pulmonary appts Call with any questions or concerns Hang in there!  You're doing great!

## 2018-09-13 NOTE — Assessment & Plan Note (Signed)
Chronic problem, pt needs new supplies for CPAP but have not been seen recently.  Referral back to pulmonary placed.

## 2018-09-13 NOTE — Addendum Note (Signed)
Addended by: Geannie RisenBRODMERKEL, Kahil Agner L on: 09/13/2018 10:17 AM   Modules accepted: Orders

## 2018-09-13 NOTE — Assessment & Plan Note (Signed)
Ongoing issue.  Pt is down nearly 11 lbs since last visit.  Applauded her efforts.  Will continue to follow.

## 2018-09-13 NOTE — Assessment & Plan Note (Signed)
Some improvement w/ addition of Wellbutrin.  Will increase to 300mg  daily and monitor for additional relief.  Continue Effexor.  Will start grief counseling at [facility]osp[facility]- pt will call and schedule.  Will continue to follow.

## 2018-09-13 NOTE — Progress Notes (Signed)
   Subjective:    Patient ID: Norma [name], female    DOB: [birth year], 48 y.o.   MRN: [id number]  HPI Depression/Anxiety- chronic problem, Wellbutrin was added at last visit to her current dose of Effexor.  PHQ 9 decreased from 10-->9.  Having some difficulty falling asleep at night.  Pt is less easily triggered than before.  Pt is not currently doing grief counseling but is open to referral.  Obesity- pt is down 11 lbs since last visit.  Pt is doing intermittent fasting w/ reduced carb intake.  OSA- pt needs to order new supplies and have appt w/ pulmonary   Review of Systems For ROS see HPI     Objective:   Physical Exam Vitals signs reviewed.  Constitutional:      Appearance: Normal appearance. She is obese.  HENT:     Head: Normocephalic and atraumatic.  Neurological:     General: No focal deficit present.     Mental Status: She is alert and oriented to person, place, and time.  Psychiatric:        Mood and Affect: Mood normal.        Behavior: Behavior normal.        Thought Content: Thought content normal.        Judgment: Judgment normal.           Assessment & Plan:

## 2018-09-25 ENCOUNTER — Encounter: Payer: Self-pay | Admitting: Nurse Practitioner

## 2018-09-25 ENCOUNTER — Ambulatory Visit: Payer: BLUE CROSS/BLUE SHIELD | Admitting: Nurse Practitioner

## 2018-09-25 VITALS — BP 122/82 | HR 68 | Ht 67.0 in | Wt 264.0 lb

## 2018-09-25 DIAGNOSIS — G4733 Obstructive sleep apnea (adult) (pediatric): Secondary | ICD-10-CM

## 2018-09-25 NOTE — Progress Notes (Signed)
Reviewed and agree with assessment/plan.   Kyndell Zeiser, MD [facility] 08/25/2016, 12:24 PM Pager:  [phone]  

## 2018-09-25 NOTE — Assessment & Plan Note (Signed)
Patient has been noncompliant with CPAP due to not having supplies.  She needs a travel CPAP also for work.  Try to reorder supplies and a new CPAP since her CPAP is more than 46 years old.  Patient Instructions  Patient continues to benefit from CPAP, unfortunately she has not been able to be compliant with CPAP recently due to not having supplies Patient's machine is more than 46 years old - needs new machine Will order supplies with full face mask Will order travel CPAP Continue CPAP at current settings Continue current medications Goal of 4 hours or more usage per night Maintain healthy weight Do not drive if drowsy Follow up with Dr. Craige CottaSood or me in 1-2 months or sooner if needed

## 2018-09-25 NOTE — Progress Notes (Signed)
@Patient  ID: Norma [name], female    DOB: 1971/03/03, 48 y.o.   MRN: [id number]  Chief Complaint  Patient presents with  . Follow-up    Referring provider: Sheliah Hatch, MD  HPI  48 year old female never smoker with obstructive sleep apnea followed by Dr. Craige Cotta.  Tests: HST 03/14/14 >> AHI 26  OV 09/25/18 - follow up sleep apnea Presents today for follow-up on sleep apnea.  She was last seen by Dr. Craige Cotta in 2017.  She states that she is out of her supplies for her CPAP.  She has not been able to use it recently due to not having supplies.  She does want to be compliant with CPAP and states that when she has her supplies she wears it nightly.  She [place] and is requesting an order for a travel CPAP so that she can take it with her.  She states that she snores loudly during the night when she does not wear her CPAP.  She does feel much less drowsy during the day when she wears her CPAP at night.  She denies any recent fever, shortness of breath, chest pain, or edema.   Allergies  Allergen Reactions  . Codeine Hives  . Depakote [Divalproex Sodium] Other (See Comments)    blisters    Immunization History  Administered Date(s) Administered  . Influenza,inj,Quad PF,6+ Mos 06/24/2014, 08/13/2017, 08/11/2018  . Tdap 08/30/2006, 09/13/2018    Past Medical History:  Diagnosis Date  . Anxiety   . ASCUS (atypical squamous cells of undetermined significance) on Pap smear 07   NEG HPV; BENIGN CHANGES 4/08  . Chronic back pain   . Claustrophobia    with mask on  . Family history of adverse reaction to anesthesia    daughter Aundra Millet low blood pressure during ear tube surgery 2 years ago  . GERD (gastroesophageal reflux disease)   . Headache    hx migraines once a year or so now  . Sleep apnea    C PAP  . 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 Urinary incontinence    Stress incontinence    Tobacco History: Social History    Tobacco Use  Smoking Status Never Smoker  Smokeless Tobacco Never Used   Counseling given: Yes   Outpatient Encounter Medications as of 09/25/2018  Medication Sig  . buPROPion (WELLBUTRIN XL) 300 MG 24 hr tablet Take 1 tablet (300 mg total) by mouth daily.  . calcium carbonate (TUMS - DOSED IN MG ELEMENTAL CALCIUM) 500 MG chewable tablet Chew 2 tablets by mouth as needed for indigestion or heartburn.  Marland Kitchen CALCIUM PO Take 1 tablet by mouth daily. When remembers  . cetirizine (ZYRTEC) 10 MG tablet Take 10 mg by mouth daily.  . clonazePAM (KLONOPIN) 1 MG tablet TAKE 1 TABLET (1 MG TOTAL) BY MOUTH AT BEDTIME.  . fexofenadine-pseudoephedrine (ALLEGRA-D) 60-120 MG 12 hr tablet Take 1 tablet by mouth 2 (two) times daily.  Marland Kitchen ibuprofen (ADVIL,MOTRIN) 200 MG tablet Take 800 mg by mouth every 6 (six) hours as needed for moderate pain.  . montelukast (SINGULAIR) 10 MG tablet TAKE 1 TABLET BY MOUTH EVERYDAY AT BEDTIME  . Multiple Vitamin (MULTIVITAMIN) tablet Take 1 tablet by mouth daily.  Marland Kitchen omeprazole (PRILOSEC) 20 MG capsule Take 20 mg by mouth daily.  Marland Kitchen venlafaxine XR (EFFEXOR-XR) 150 MG 24 hr capsule TAKE ONE CAPSULE EVERY DAY WITH BREAKFAST.   No facility-administered encounter medications on file as  of 09/25/2018.      Review of Systems  Review of Systems  Constitutional: Negative.  Negative for chills and fever.  HENT: Negative.  Negative for postnasal drip.   Respiratory: Negative for cough, shortness of breath and wheezing.   Cardiovascular: Negative.  Negative for chest pain, palpitations and leg swelling.  Gastrointestinal: Negative.   Allergic/Immunologic: Negative.   Neurological: Negative.   Psychiatric/Behavioral: Negative.        Physical Exam  BP 122/82 (BP Location: Left Arm, Patient Position: Sitting, Cuff Size: Normal)   Pulse 68   Ht 5\' 7"  (1.702 m)   Wt 264 lb (119.7 kg)   SpO2 98%   BMI 41.35 kg/m   Wt Readings from Last 5 Encounters:  09/25/18 264 lb  (119.7 kg)  09/13/18 260 lb 6 oz (118.1 kg)  08/11/18 271 lb 4 oz (123 kg)  04/05/18 264 lb (119.7 kg)  01/26/18 265 lb (120.2 kg)     Physical Exam Vitals signs and nursing note reviewed.  Constitutional:      General: She is not in acute distress.    Appearance: She is well-developed.  Cardiovascular:     Rate and Rhythm: Normal rate and regular rhythm.  Pulmonary:     Effort: Pulmonary effort is normal. No respiratory distress.     Breath sounds: Normal breath sounds. No wheezing or rhonchi.  Musculoskeletal:        General: No swelling.  Neurological:     Mental Status: She is alert and oriented to person, place, and time.        Assessment & Plan:   OSA (obstructive sleep apnea) Patient has been noncompliant with CPAP due to not having supplies.  She needs a travel CPAP also for work.  Try to reorder supplies and a new CPAP since her CPAP is more than 46 years old.  Patient Instructions  Patient continues to benefit from CPAP, unfortunately she has not been able to be compliant with CPAP recently due to not having supplies Patient's machine is more than 46 years old - needs new machine Will order supplies with full face mask Will order travel CPAP Continue CPAP at current settings Continue current medications Goal of 4 hours or more usage per night Maintain healthy weight Do not drive if drowsy Follow up with Dr. Craige CottaSood or me in 1-2 months or sooner if needed        Ivonne Andrewonya S Seleste Tallman, NP 09/25/2018

## 2018-09-25 NOTE — Patient Instructions (Signed)
Patient continues to benefit from CPAP, unfortunately she has not been able to be compliant with CPAP recently due to not having supplies Patient's machine is more than 46 years old - needs new machine Will order supplies with full face mask Will order travel CPAP Continue CPAP at current settings Continue current medications Goal of 4 hours or more usage per night Maintain healthy weight Do not drive if drowsy Follow up with Dr. Craige Cotta or me in 1-2 months or sooner if needed

## 2018-09-27 ENCOUNTER — Telehealth: Payer: Self-pay | Admitting: Nurse Practitioner

## 2018-09-27 NOTE — Telephone Encounter (Signed)
Spoke with [place] from [facility]. She stated the patient does not qualify for a new CPAP until 04/17/19. Boneta Lucks stated she contacted the patient directly and made her aware of this. Boneta Lucks ordered new CPAP supplies for the patient. The patient will need a new order placed for CPAP machine in August as the order received cannot be held until that time.  Message routed to Angus Seller, NP to advise.

## 2018-09-29 NOTE — Telephone Encounter (Signed)
Previously routed to Celine Ahr, NP, waiting on reply

## 2018-10-02 NOTE — Telephone Encounter (Signed)
Will close message at this time. 

## 2018-10-07 ENCOUNTER — Other Ambulatory Visit: Payer: Self-pay | Admitting: Family Medicine

## 2018-10-24 ENCOUNTER — Encounter: Payer: Self-pay | Admitting: Family Medicine

## 2018-10-24 ENCOUNTER — Other Ambulatory Visit: Payer: Self-pay

## 2018-10-24 ENCOUNTER — Ambulatory Visit: Payer: BLUE CROSS/BLUE SHIELD | Admitting: Family Medicine

## 2018-10-24 VITALS — BP 131/86 | HR 93 | Temp 98.1°F | Resp 16 | Ht 67.0 in | Wt 255.1 lb

## 2018-10-24 DIAGNOSIS — M25572 Pain in left ankle and joints of left foot: Secondary | ICD-10-CM

## 2018-10-24 DIAGNOSIS — F418 Other specified anxiety disorders: Secondary | ICD-10-CM | POA: Diagnosis not present

## 2018-10-24 NOTE — Assessment & Plan Note (Signed)
Improved.  Pt has lost another 9 lbs since last visit.  Applauded her efforts.  will continue to follow.
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
## 2018-10-24 NOTE — Assessment & Plan Note (Signed)
Ongoing issue for pt.  Improving since Wellbutrin was increased.  Now seeing a therapist- which I applauded.  Will continue to follow.

## 2018-10-24 NOTE — Patient Instructions (Signed)
Schedule your complete physical in 3-4 months No med changes at this time Keep up the good work on healthy diet and regular exercise- you're doing great! We'll call you with your Sports Med App for the ankle Call with any questions or concerns Hang in there!!!

## 2018-11-07 ENCOUNTER — Encounter: Payer: Self-pay | Admitting: Cardiology

## 2018-11-07 ENCOUNTER — Ambulatory Visit: Payer: BLUE CROSS/BLUE SHIELD | Admitting: Cardiology

## 2018-11-07 VITALS — BP 126/88 | HR 90 | Ht 67.0 in | Wt 258.8 lb

## 2018-11-07 DIAGNOSIS — Z8249 Family history of ischemic heart disease and other diseases of the circulatory system: Secondary | ICD-10-CM | POA: Diagnosis not present

## 2018-11-07 DIAGNOSIS — R0602 Shortness of breath: Secondary | ICD-10-CM | POA: Diagnosis not present

## 2018-11-07 NOTE — Progress Notes (Signed)
Cardiology Office Note:    Date:  11/07/2018   ID:  Norma [name], DOB Jan 03, 1971, MRN [id number]  PCP:  Sheliah Hatch, MD  Cardiologist:  Donato Schultz, MD  Electrophysiologist:  None   Referring MD: Sheliah Hatch, MD     History of Present Illness:    Norma [name] is a 48 y.o. female here for the evaluation of possible hypertrophic cardiomyopathy, her mother had hypertrophic cardiomyopathy, had surgery died 3 weeks post surgery. [facility].   She had been diagnosed with shortness of breath sleep apnea.  EKG today shows normal sinus rhythm rate 90 with no other abnormalities.  No hypertrophic findings.  Her daughter has heart issues, PDA did not close, ASD an VSD (6 weeks old) Health and safety inspector. . Has some SOB with steps.   [employer] boot camp tough. Discouraged. Seems winded.  Exercise has been challenging for her.  Has never had unexplained syncope.  No other siblings have been diagnosed with hypertrophic cardiomyopathy.  Non-smoker.  Nondiabetic.  Past Medical History:  Diagnosis Date  . Anxiety   . ASCUS (atypical squamous cells of undetermined significance) on Pap smear 07   NEG HPV; BENIGN CHANGES 4/08  . Chronic back pain   . Claustrophobia    with mask on  . Family history of adverse reaction to anesthesia    daughter Norma [name] low blood pressure during ear tube surgery 2 years ago  . GERD (gastroesophageal reflux disease)   . Headache    hx migraines once a year or so now  . Sleep apnea    C PAP  . 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 Urinary incontinence    Stress incontinence    Past Surgical History:  Procedure Laterality Date  . BILATERAL FUSION  61   age 89--Foot  . CHOLECYSTECTOMY N/A 08/13/2016   Procedure: LAPAROSCOPIC CHOLECYSTECTOMY WITH INTRAOPERATIVE CHOLANGIOGRAM;  Surgeon: Avel Peace, MD;  Location: [facility];  Service: General;  Laterality: N/A;  . INTRAUTERINE DEVICE INSERTION  07/2015   Mirena  . INTRAUTERINE  DEVICE INSERTION  05/19/2017   Mirena  . LAPAROSCOPIC ABDOMINAL EXPLORATION  08/13/2016   Procedure: LAPAROSCOPIC  SMALL BOWEL EXPLORATION;  Surgeon: Avel Peace, MD;  Location: [facility];  Service: General;;  . Toney Reil removed  spring 2017    Current Medications: Current Meds  Medication Sig  . buPROPion (WELLBUTRIN XL) 300 MG 24 hr tablet TAKE 1 TABLET BY MOUTH EVERY DAY  . calcium carbonate (TUMS - DOSED IN MG ELEMENTAL CALCIUM) 500 MG chewable tablet Chew 2 tablets by mouth as needed for indigestion or heartburn.  Marland Kitchen CALCIUM PO Take 1 tablet by mouth daily. When remembers  . cetirizine (ZYRTEC) 10 MG tablet Take 10 mg by mouth daily.  . clonazePAM (KLONOPIN) 1 MG tablet TAKE 1 TABLET (1 MG TOTAL) BY MOUTH AT BEDTIME.  . fexofenadine-pseudoephedrine (ALLEGRA-D) 60-120 MG 12 hr tablet Take 1 tablet by mouth 2 (two) times daily.  Marland Kitchen ibuprofen (ADVIL,MOTRIN) 200 MG tablet Take 800 mg by mouth every 6 (six) hours as needed for moderate pain.  . Multiple Vitamin (MULTIVITAMIN) tablet Take 1 tablet by mouth daily.  Marland Kitchen omeprazole (PRILOSEC) 20 MG capsule Take 20 mg by mouth daily.  Marland Kitchen venlafaxine XR (EFFEXOR-XR) 150 MG 24 hr capsule TAKE ONE CAPSULE EVERY DAY WITH BREAKFAST.     Allergies:   Codeine and Depakote [divalproex sodium]   Social History   Socioeconomic History  . Marital status: Married  Spouse name: Not on file  . Number of children: Not on file  . Years of education: Not on file  . Highest education level: Not on file  Occupational History  . Not on file  Social Needs  . Financial resource strain: Not on file  . Food insecurity:    Worry: Not on file    Inability: Not on file  . Transportation needs:    Medical: Not on file    Non-medical: Not on file  Tobacco Use  . Smoking status: Never Smoker  . Smokeless tobacco: Never Used  Substance and Sexual Activity  . Alcohol use: Yes    Alcohol/week: 0.0 standard drinks    Comment: SOCIALLY  . Drug use: No  .  Sexual activity: Yes    Birth control/protection: I.U.D.    Comment: 1st intercourse 48 yo-Fewer than 5 partners Mirena IUD 05/19/2017  Lifestyle  . Physical activity:    Days per week: Not on file    Minutes per session: Not on file  . Stress: Not on file  Relationships  . Social connections:    Talks on phone: Not on file    Gets together: Not on file    Attends religious service: Not on file    Active member of club or organization: Not on file    Attends meetings of clubs or organizations: Not on file    Relationship status: Not on file  Other Topics Concern  . Not on file  Social History Narrative  . Not on file     Family History: The patient's family history includes Cardiomyopathy in her mother; Depression in her father; Heart disease in her maternal grandfather, paternal grandfather, and paternal grandmother; Heart failure in her maternal grandmother; Hypertension in her maternal grandfather, paternal grandfather, and paternal grandmother.  ROS:   Please see the history of present illness.     All other systems reviewed and are negative.  EKGs/Labs/Other Studies Reviewed:    The following studies were reviewed today: Prior office note lab work EKG reviewed  EKG:  EKG is  ordered today.  The ekg ordered today demonstrates sinus rhythm 90 with no other abnormalities.  Normal intervals.  Personally reviewed and interpreted  Recent Labs: 08/11/2018: ALT 23; BUN 15; Creatinine, Ser 0.68; Hemoglobin 13.0; Platelets 185.0; Potassium 3.7; Sodium 139; TSH 1.03  Recent Lipid Panel    Component Value Date/Time   CHOL 171 08/11/2018 0847   TRIG 180.0 (H) 08/11/2018 0847   HDL 52.10 08/11/2018 0847   CHOLHDL 3 08/11/2018 0847   VLDL 36.0 08/11/2018 0847   LDLCALC 83 08/11/2018 0847   LDLDIRECT 105.0 10/22/2015 1005    Physical Exam:    VS:  BP 126/88   Pulse 90   Ht  (1.702 m)   Wt 258 lb 12.8 oz (117.4 kg)   SpO2 94%   BMI 40.53 kg/m     Wt Readings from  Last 3 Encounters:  11/07/18 258 lb 12.8 oz (117.4 kg)  10/24/18 255 lb 2 oz (115.7 kg)  09/25/18 264 lb (119.7 kg)     GEN:  Well nourished, well developed in no acute distress HEENT: Normal NECK: No JVD; No carotid bruits LYMPHATICS: No lymphadenopathy CARDIAC: RRR, no murmurs, rubs, gallops RESPIRATORY:  Clear to auscultation without rales, wheezing or rhonchi  ABDOMEN: Soft, non-tender, non-distended MUSCULOSKELETAL:  No edema; No deformity  SKIN: Warm and dry NEUROLOGIC:  Alert and oriented x 3 PSYCHIATRIC:  Normal affect   ASSESSMENT:  1. Family history of hypertrophic cardiomyopathy   2. Shortness of breath    PLAN:    In order of problems listed above:  Family history of hypertrophic cardiomyopathy, mother, died at age 8 after myomectomy Shortness of breath - She has been shortness of breath with activity.  Has had this for quite some time.  I do not appreciate any significant murmurs on exam.  Her EKG is normal.  We will go ahead and check an echocardiogram to see if there is any phenotypic difference in her heart structure compatible with hypertrophic cardiomyopathy.  She told the story of her mother who had a diagnosis originally of deconditioning than of COPD then of reactive airways then finally happened to be talking with a cardiothoracic surgeon in [place] on vacation who said that he thought she had hypertrophic cardiomyopathy and then she proceeded with that treatment strategy.  She has had no history of sudden syncope with or without exercise.  She has no family members with early sudden cardiac death.  Her mother died at age 50.  She is a non-smoker. -We will also check an exercise treadmill test.  If findings are reassuring, continue to promote exercise.   Medication Adjustments/Labs and Tests Ordered: Current medicines are reviewed at length with the patient today.  Concerns regarding medicines are outlined above.  Orders Placed This Encounter  Procedures    . EXERCISE TOLERANCE TEST (ETT)  . EKG 12-Lead  . ECHOCARDIOGRAM COMPLETE   No orders of the defined types were placed in this encounter.   Patient Instructions  Medication Instructions:  The current medical regimen is effective;  continue present plan and medications.  If you need a refill on your cardiac medications before your next appointment, please call your pharmacy.   Testing/Procedures: Your physician has requested that you have an exercise tolerance test. For further information please visit [email or web address] Please also follow instruction sheet, as given.  Your physician has requested that you have an echocardiogram. Echocardiography is a painless test that uses sound waves to create images of your heart. It provides your doctor with information about the size and shape of your heart and how well your heart's chambers and valves are working. This procedure takes approximately one hour. There are no restrictions for this procedure.   Follow-Up: Follow up will be based on the results of the above testing.  Thank you for choosing [facility]!!         Signed, Donato Schultz, MD  11/07/2018 5:35 PM    [facility]

## 2018-11-07 NOTE — Patient Instructions (Signed)
Medication Instructions:  The current medical regimen is effective;  continue present plan and medications.  If you need a refill on your cardiac medications before your next appointment, please call your pharmacy.   Testing/Procedures: Your physician has requested that you have an exercise tolerance test. For further information please visit [email or web address] Please also follow instruction sheet, as given.  Your physician has requested that you have an echocardiogram. Echocardiography is a painless test that uses sound waves to create images of your heart. It provides your doctor with information about the size and shape of your heart and how well your heart's chambers and valves are working. This procedure takes approximately one hour. There are no restrictions for this procedure.   Follow-Up: Follow up will be based on the results of the above testing.  Thank you for choosing [facility]!!

## 2018-11-09 DIAGNOSIS — F4323 Adjustment disorder with mixed anxiety and depressed mood: Secondary | ICD-10-CM | POA: Diagnosis not present

## 2018-11-10 DIAGNOSIS — F4323 Adjustment disorder with mixed anxiety and depressed mood: Secondary | ICD-10-CM | POA: Diagnosis not present

## 2018-11-14 ENCOUNTER — Other Ambulatory Visit: Payer: Self-pay | Admitting: Family Medicine

## 2018-11-14 NOTE — Telephone Encounter (Signed)
Last OV 10/24/18 Clonazepam last filled 09/11/18 #30 with 0

## 2018-11-15 ENCOUNTER — Ambulatory Visit: Payer: BLUE CROSS/BLUE SHIELD | Admitting: Family Medicine

## 2018-11-15 DIAGNOSIS — F4323 Adjustment disorder with mixed anxiety and depressed mood: Secondary | ICD-10-CM | POA: Diagnosis not present

## 2018-11-16 ENCOUNTER — Telehealth: Payer: Self-pay

## 2018-11-16 DIAGNOSIS — F4323 Adjustment disorder with mixed anxiety and depressed mood: Secondary | ICD-10-CM | POA: Diagnosis not present

## 2018-11-16 NOTE — Telephone Encounter (Signed)
After reviewing patient's chart Dr. Nishan recommends patient's GXT be rescheduled at 6 to 8 weeks from original appointment, due to COVID 19 precautions. [facility] will call patient to reschedule. 

## 2018-11-20 NOTE — Telephone Encounter (Signed)
Echo and GXT resc to 01-03-19

## 2018-11-22 ENCOUNTER — Other Ambulatory Visit (HOSPITAL_COMMUNITY): Payer: BLUE CROSS/BLUE SHIELD

## 2018-11-24 ENCOUNTER — Ambulatory Visit: Payer: BLUE CROSS/BLUE SHIELD | Admitting: Pulmonary Disease

## 2018-11-24 DIAGNOSIS — F4323 Adjustment disorder with mixed anxiety and depressed mood: Secondary | ICD-10-CM | POA: Diagnosis not present

## 2018-11-25 DIAGNOSIS — F4323 Adjustment disorder with mixed anxiety and depressed mood: Secondary | ICD-10-CM | POA: Diagnosis not present

## 2018-12-07 ENCOUNTER — Other Ambulatory Visit: Payer: Self-pay | Admitting: Family Medicine

## 2018-12-26 ENCOUNTER — Other Ambulatory Visit: Payer: Self-pay | Admitting: Physician Assistant

## 2018-12-26 DIAGNOSIS — H1011 Acute atopic conjunctivitis, right eye: Secondary | ICD-10-CM

## 2019-01-03 ENCOUNTER — Ambulatory Visit (HOSPITAL_COMMUNITY): Payer: BLUE CROSS/BLUE SHIELD | Attending: Cardiology

## 2019-01-23 ENCOUNTER — Other Ambulatory Visit: Payer: Self-pay | Admitting: Family Medicine

## 2019-01-23 NOTE — Telephone Encounter (Signed)
Last refill:11/14/18 #30, 0 Last OV:10/24/18

## 2019-01-26 ENCOUNTER — Encounter: Payer: BLUE CROSS/BLUE SHIELD | Admitting: Family Medicine

## 2019-02-14 ENCOUNTER — Encounter (HOSPITAL_COMMUNITY): Payer: Self-pay | Admitting: Cardiology

## 2019-02-28 ENCOUNTER — Other Ambulatory Visit: Payer: Self-pay | Admitting: Family Medicine

## 2019-02-28 ENCOUNTER — Encounter: Payer: BLUE CROSS/BLUE SHIELD | Admitting: Family Medicine

## 2019-03-09 ENCOUNTER — Ambulatory Visit (INDEPENDENT_AMBULATORY_CARE_PROVIDER_SITE_OTHER): Payer: BC Managed Care – PPO | Admitting: Family Medicine

## 2019-03-09 ENCOUNTER — Other Ambulatory Visit: Payer: Self-pay

## 2019-03-09 ENCOUNTER — Encounter: Payer: Self-pay | Admitting: Family Medicine

## 2019-03-09 VITALS — BP 124/81 | HR 76 | Temp 98.1°F | Resp 16 | Ht 67.0 in | Wt 250.0 lb

## 2019-03-09 DIAGNOSIS — Z1231 Encounter for screening mammogram for malignant neoplasm of breast: Secondary | ICD-10-CM | POA: Diagnosis not present

## 2019-03-09 DIAGNOSIS — Z0000 Encounter for general adult medical examination without abnormal findings: Secondary | ICD-10-CM | POA: Insufficient documentation

## 2019-03-09 DIAGNOSIS — E669 Obesity, unspecified: Secondary | ICD-10-CM

## 2019-03-09 DIAGNOSIS — R198 Other specified symptoms and signs involving the digestive system and abdomen: Secondary | ICD-10-CM | POA: Diagnosis not present

## 2019-03-09 LAB — CBC WITH DIFFERENTIAL/PLATELET
Basophils Absolute: 0 10*3/uL (ref 0.0–0.1)
Basophils Relative: 0.5 % (ref 0.0–3.0)
Eosinophils Absolute: 0.2 10*3/uL (ref 0.0–0.7)
Eosinophils Relative: 2.4 % (ref 0.0–5.0)
HCT: 40 % (ref 36.0–46.0)
Hemoglobin: 13.2 g/dL (ref 12.0–15.0)
Lymphocytes Relative: 25.4 % (ref 12.0–46.0)
Lymphs Abs: 1.8 10*3/uL (ref 0.7–4.0)
MCHC: 32.9 g/dL (ref 30.0–36.0)
MCV: 87.4 fl (ref 78.0–100.0)
Monocytes Absolute: 0.4 10*3/uL (ref 0.1–1.0)
Monocytes Relative: 5 % (ref 3.0–12.0)
Neutro Abs: 4.7 10*3/uL (ref 1.4–7.7)
Neutrophils Relative %: 66.7 % (ref 43.0–77.0)
Platelets: 193 10*3/uL (ref 150.0–400.0)
RBC: 4.58 Mil/uL (ref 3.87–5.11)
RDW: 13.9 % (ref 11.5–15.5)
WBC: 7.1 10*3/uL (ref 4.0–10.5)

## 2019-03-09 LAB — LIPID PANEL
Cholesterol: 173 mg/dL (ref 0–200)
HDL: 50.6 mg/dL (ref >39.00)
NonHDL: 122.29
Total CHOL/HDL Ratio: 3
Triglycerides: 237 mg/dL — ABNORMAL HIGH (ref 0.0–149.0)
VLDL: 47.4 mg/dL — ABNORMAL HIGH (ref 0.0–40.0)

## 2019-03-09 LAB — BASIC METABOLIC PANEL
BUN: 17 mg/dL (ref 6–23)
CO2: 29 mEq/L (ref 19–32)
Calcium: 9 mg/dL (ref 8.4–10.5)
Chloride: 101 mEq/L (ref 96–112)
Creatinine, Ser: 0.81 mg/dL (ref 0.40–1.20)
GFR: 76.15 mL/min (ref >60.00)
Glucose, Bld: 80 mg/dL (ref 70–99)
Potassium: 4.3 mEq/L (ref 3.5–5.1)
Sodium: 138 mEq/L (ref 135–145)

## 2019-03-09 LAB — HEPATIC FUNCTION PANEL
ALT: 16 U/L (ref 0–35)
AST: 13 U/L (ref 0–37)
Albumin: 4.7 g/dL (ref 3.5–5.2)
Alkaline Phosphatase: 93 U/L (ref 39–117)
Bilirubin, Direct: 0.1 mg/dL (ref 0.0–0.3)
Total Bilirubin: 0.6 mg/dL (ref 0.2–1.2)
Total Protein: 7.3 g/dL (ref 6.0–8.3)

## 2019-03-09 LAB — LDL CHOLESTEROL, DIRECT: Direct LDL: 105 mg/dL

## 2019-03-09 LAB — TSH: TSH: 1.23 u[IU]/mL (ref 0.35–4.50)

## 2019-03-09 MED ORDER — TRIAMCINOLONE ACETONIDE 0.1 % EX OINT: 1.0000 "application " | TOPICAL_OINTMENT | Freq: Two times a day (BID) | CUTANEOUS | 1 refills | Status: DC

## 2019-03-09 NOTE — Assessment & Plan Note (Signed)
Pt's PE WNL.  UTD on pap.  Due for mammo- ordered.  Check labs.  Anticipatory guidance provided.

## 2019-03-09 NOTE — Assessment & Plan Note (Signed)
Pt is down 10 lbs since last visit.  Applauded her efforts.  Will follow.

## 2019-03-09 NOTE — Progress Notes (Signed)
   Subjective:    Patient ID: Norma [name], female    DOB: 1971/04/27, 48 y.o.   MRN: [id number]  HPI CPE- UTD on pap, immunizations.  Due for mammo.  Pt is down 10 lbs- watching carb intake and exercising.   Review of Systems Patient reports no vision/ hearing changes, adenopathy,fever, weight change,  persistant/recurrent hoarseness , swallowing issues, chest pain, palpitations, edema, persistant/recurrent cough, hemoptysis, dyspnea (rest/exertional/paroxysmal nocturnal), gastrointestinal bleeding (melena, rectal bleeding), abdominal pain, significant heartburn, GU symptoms (dysuria, hematuria, incontinence), Gyn symptoms (abnormal  bleeding, pain),  syncope, focal weakness, memory loss, numbness & tingling, hair/nail changes, abnormal bruising or bleeding, anxiety, or depression.   Bowel problems- pt reports she will alternate between severe constipation and diarrhea.  Aware of some food triggers but otherwise unable to predict.  Hand dermatitis- worsened w/ increased hand washing    Objective:   Physical Exam General Appearance:    Alert, cooperative, no distress, appears stated age, obese  Head:    Normocephalic, without obvious abnormality, atraumatic  Eyes:    PERRL, conjunctiva/corneas clear, EOM's intact, fundi    benign, both eyes  Ears:    Normal TM's and external ear canals, both ears  Nose:   Nares normal, septum midline, mucosa normal, no drainage    or sinus tenderness  Throat:   Lips, mucosa, and tongue normal; teeth and gums normal  Neck:   Supple, symmetrical, trachea midline, no adenopathy;    Thyroid: no enlargement/tenderness/nodules  Back:     Symmetric, no curvature, ROM normal, no CVA tenderness  Lungs:     Clear to auscultation bilaterally, respirations unlabored  Chest Wall:    No tenderness or deformity   Heart:    Regular rate and rhythm, S1 and S2 normal, no murmur, rub   or gallop  Breast Exam:    Deferred to GYN  Abdomen:     Soft, non-tender,  bowel sounds active all four quadrants,    no masses, no organomegaly  Genitalia:    Deferred to GYN  Rectal:    Extremities:   Extremities normal, atraumatic, no cyanosis or edema  Pulses:   2+ and symmetric all extremities  Skin:   Skin color, texture, turgor normal, no rashes or lesions  Lymph nodes:   Cervical, supraclavicular, and axillary nodes normal  Neurologic:   CNII-XII intact, normal strength, sensation and reflexes    throughout          Assessment & Plan:

## 2019-03-09 NOTE — Patient Instructions (Addendum)
Follow up in 1 year or as needed We'll notify you of your lab results and make any changes if needed Continue to work on healthy diet and regular exercise- you look great! Schedule your mammogram at your convenience Call with any questions or concerns Stay Safe!!!

## 2019-03-13 ENCOUNTER — Encounter: Payer: Self-pay | Admitting: Gastroenterology

## 2019-03-26 ENCOUNTER — Other Ambulatory Visit: Payer: Self-pay | Admitting: Physician Assistant

## 2019-03-26 DIAGNOSIS — H1011 Acute atopic conjunctivitis, right eye: Secondary | ICD-10-CM

## 2019-04-15 NOTE — Progress Notes (Signed)
Referring Provider: Sheliah Hatchabori, Katherine E, MD Primary Care Physician:  Sheliah Hatchabori, Katherine E, MD   Reason for Consultation: Alternating diarrhea and constipation   IMPRESSION:  Alternating bowel habits Prior cholecystectomy 2017 for chronic cholecystitis Recent intentional weight loss (20 pounds) Fatty liver on ultrasound 2017 Family history of autoimmune disease (mother with psoriasis) Colonoscopy with Dr. Loreta AveMann around 2003 No known family history of colon cancer or polyps  Altered bowel habits for several years.  Severe urgency with associated abdominal cramping, change in stool consistency that alternates between explosive watery, loose and gloppy, and constipation. No alarm features.  Symptoms not improving despite Tums or Pepto-Bismol.  PLAN: Colonoscopy with random colon biopsies recommended Obtain prior records from Dr. Loreta AveMann (distant colonoscopy performed for abdominal pain) Trial of cholestipol 2 g once daily; increase to 2 g twice daily after one week Follow-up after colonoscopy  HPI: Norma [name] is a 48 y.o. female referred by Dr. Beverely Lowabori for further evaluation of alternating diarrhea and constipation.  History is obtained to the patient and review of her electronic health record.  She has anxiety, depression, headaches, sleep apnea for 3 years, and obesity.  She had a cholecystectomy in 2017 for mild chronic cholecystitis.  Previously seen by Dr. Loreta AveMann in 2003 for abdominal pain and constipation.  She has had several years of altered bowel habits. Intermittent severe urge to defecate. Severe straining with severe abdominal cramping while trying to defecate that occurs a few times each week. When she finally goes it's followed by diarrhea. She will have a day or two or loose stools. Bowel habits then return to normal  One soft serve ice cream bowel movement daily.  Will have constipation when she is dehydrated - but this hasn't been a recent problem.   Stools are a yellow  gloppy consistency. Greasy foods trigger her symptoms.  Symptoms similar  to prior her cholecystectomy.  Feels like things are worse since then, not better.  Some association with stress, but, she doesn't think this is different from her baseline.   Has a good appetite. Trying to eat a clean diet, less carbohydrates, eats more vegetables. Avoiding daily - although this is not an identified trigger.  Normal appetite. Has intentionally lost 20 pounds.   Using Tums a few days each week and Peptobismal liquid a couple of times a week. Has not recently used Imodium.  On medications for reflux that she switches to avoid developing tolerance. Has not tried other specific therapies.  No other associated symptoms. No identified exacerbating or relieving features.   Denies a precipitating event, trauma, close contacts with similar symptoms, changes in diet, recent travel or antibiotic use.   Colonoscopy in 2220s with Dr. Loreta AveMann for severe constipation and abdominal pain.   Prior abdominal imaging: Abd ultrasound 06/12/16: hepatic steatosis CT abd/pel with contrast 06/12/16:  No acute findings. Specifically, no findings to explain the patient's history of right upper quadrant pain. Very short segment jejunal enteroenteric intussusception without wall thickening, obstruction, or perienteric edema/inflammation. This is most likely a transient process. HIDA with CCK 07/16/16: GBEF 10%. Patent cystic duct and common bile duct.  Mother with chronic diarrhea, cardiomyopathy, and psoriasis. Younger brother with GI problems. No known family history of colon cancer or polyps. No family history of uterine/endometrial cancer, pancreatic cancer or gastric/stomach cancer.  The patient is concerned about the possibility of Crohn's disease after reviewing the differential on the Internet.  Past Medical History:  Diagnosis Date  . Anxiety   . ASCUS (  atypical squamous cells of undetermined significance) on Pap smear 07    NEG HPV; BENIGN CHANGES 4/08  . Chronic back pain   . Claustrophobia    with mask on  . Family history of adverse reaction to anesthesia    daughter Jinny Blossom low blood pressure during ear tube surgery 2 years ago  . GERD (gastroesophageal reflux disease)   . Headache    hx migraines once a year or so now  . Sleep apnea    C PAP  . 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 Urinary incontinence    Stress incontinence    Past Surgical History:  Procedure Laterality Date  . BILATERAL FUSION  39   age 62--Foot  . CHOLECYSTECTOMY N/A 08/13/2016   Procedure: LAPAROSCOPIC CHOLECYSTECTOMY WITH INTRAOPERATIVE CHOLANGIOGRAM;  Surgeon: Jackolyn Confer, MD;  Location: [facility];  Service: General;  Laterality: N/A;  . INTRAUTERINE DEVICE INSERTION  07/2015   Mirena  . INTRAUTERINE DEVICE INSERTION  05/19/2017   Mirena  . LAPAROSCOPIC ABDOMINAL EXPLORATION  08/13/2016   Procedure: LAPAROSCOPIC  SMALL BOWEL EXPLORATION;  Surgeon: Jackolyn Confer, MD;  Location: [facility];  Service: General;;  Verdis Frederickson removed  spring 2017    Current Outpatient Medications  Medication Sig Dispense Refill  . azelastine (OPTIVAR) 0.05 % ophthalmic solution INSTILL 1 DROP INTO RIGHT EYE TWICE A DAY 6 mL 1  . buPROPion (WELLBUTRIN XL) 300 MG 24 hr tablet TAKE 1 TABLET BY MOUTH EVERY DAY 90 tablet 2  . calcium carbonate (TUMS - DOSED IN MG ELEMENTAL CALCIUM) 500 MG chewable tablet Chew 2 tablets by mouth as needed for indigestion or heartburn.    Marland Kitchen CALCIUM PO Take 1 tablet by mouth daily. When remembers    . cetirizine (ZYRTEC) 10 MG tablet Take 10 mg by mouth daily.    . clonazePAM (KLONOPIN) 1 MG tablet TAKE 1 TABLET (1 MG TOTAL) BY MOUTH AT BEDTIME. 30 tablet 0  . fexofenadine-pseudoephedrine (ALLEGRA-D) 60-120 MG 12 hr tablet Take 1 tablet by mouth 2 (two) times daily. 60 tablet 0  . ibuprofen (ADVIL,MOTRIN) 200 MG tablet Take 800 mg by mouth every 6 (six) hours as needed for moderate pain.    .  Multiple Vitamin (MULTIVITAMIN) tablet Take 1 tablet by mouth daily.    Marland Kitchen omeprazole (PRILOSEC) 20 MG capsule Take 20 mg by mouth daily.    Marland Kitchen triamcinolone ointment (KENALOG) 0.1 % Apply 1 application topically 2 (two) times daily. 90 g 1  . venlafaxine XR (EFFEXOR-XR) 150 MG 24 hr capsule TAKE ONE CAPSULE EVERY DAY WITH BREAKFAST. 90 capsule 0   No current facility-administered medications for this visit.     Allergies as of 04/16/2019 - Review Complete 03/09/2019  Allergen Reaction Noted  . Codeine Hives 11/22/2008  . Depakote [divalproex sodium] Other (See Comments) 01/03/2013    Family History  Problem Relation Age of Onset  . Hypertension Maternal Grandfather   . Heart disease Maternal Grandfather   . Hypertension Paternal Grandmother   . Heart disease Paternal Grandmother   . Hypertension Paternal Grandfather   . Heart disease Paternal Grandfather   . Depression Father   . Heart failure Maternal Grandmother   . Cardiomyopathy Mother     Social History   Socioeconomic History  . Marital status: Married    Spouse name: Not on file  . Number of children: Not on file  . Years of education: Not on file  . Highest education  level: Not on file  Occupational History  . Not on file  Social Needs  . Financial resource strain: Not on file  . Food insecurity    Worry: Not on file    Inability: Not on file  . Transportation needs    Medical: Not on file    Non-medical: Not on file  Tobacco Use  . Smoking status: Never Smoker  . Smokeless tobacco: Never Used  Substance and Sexual Activity  . Alcohol use: Yes    Alcohol/week: 0.0 standard drinks    Comment: SOCIALLY  . Drug use: No  . Sexual activity: Yes    Birth control/protection: I.U.D.    Comment: 1st intercourse 48 yo-Fewer than 5 partners Mirena IUD 05/19/2017  Lifestyle  . Physical activity    Days per week: Not on file    Minutes per session: Not on file  . Stress: Not on file  Relationships  . Social  Musicianconnections    Talks on phone: Not on file    Gets together: Not on file    Attends religious service: Not on file    Active member of club or organization: Not on file    Attends meetings of clubs or organizations: Not on file    Relationship status: Not on file  . Intimate partner violence    Fear of current or ex partner: Not on file    Emotionally abused: Not on file    Physically abused: Not on file    Forced sexual activity: Not on file  Other Topics Concern  . Not on file  Social History Narrative  . Not on file    Review of Systems: 12 system ROS is negative except as noted above.   Physical Exam: @VSRANGES @   General:   Alert,  well-nourished, pleasant and cooperative in NAD Head:  Normocephalic and atraumatic. Eyes:  Sclera clear, no icterus.   Conjunctiva pink. Ears:  Normal auditory acuity. Nose:  No deformity, discharge,  or lesions. Mouth:  No deformity or lesions.   Neck:  Supple; no masses or thyromegaly. Lungs:  Clear throughout to auscultation.   No wheezes. Heart:  Regular rate and rhythm; no murmurs. Abdomen:  Soft,nontender, nondistended, normal bowel sounds, no rebound or guarding. No hepatosplenomegaly.   Rectal:  Deferred  Msk:  Symmetrical. No boney deformities LAD: No inguinal or umbilical LAD Extremities:  No clubbing or edema. Neurologic:  Alert and  oriented x4;  grossly nonfocal Skin:  Intact without significant lesions or rashes. Psych:  Alert and cooperative. Normal mood and affect.      Tanvi Gatling L. Orvan FalconerBeavers, MD, MPH 04/15/2019, 9:07 PM

## 2019-04-16 ENCOUNTER — Encounter: Payer: Self-pay | Admitting: Gastroenterology

## 2019-04-16 ENCOUNTER — Ambulatory Visit (INDEPENDENT_AMBULATORY_CARE_PROVIDER_SITE_OTHER): Payer: BC Managed Care – PPO | Admitting: Gastroenterology

## 2019-04-16 VITALS — BP 122/88 | HR 95 | Temp 98.5°F | Ht 67.0 in | Wt 248.4 lb

## 2019-04-16 DIAGNOSIS — Z9049 Acquired absence of other specified parts of digestive tract: Secondary | ICD-10-CM

## 2019-04-16 DIAGNOSIS — R194 Change in bowel habit: Secondary | ICD-10-CM | POA: Diagnosis not present

## 2019-04-16 MED ORDER — NA SULFATE-K SULFATE-MG SULF 17.5-3.13-1.6 GM/177ML PO SOLN: 1.0000 | ORAL | 0 refills | Status: AC

## 2019-04-16 MED ORDER — COLESTIPOL HCL 1 G PO TABS: 2.0000 g | ORAL_TABLET | Freq: Two times a day (BID) | ORAL | 2 refills | Status: DC

## 2019-04-16 NOTE — Patient Instructions (Addendum)
It was a pleasure to me you today.  I have recommended a trial of cholestipol 2 g taken once daily. You may increase to 2 g twice daily after one week.  You have been scheduled for a colonoscopy with random biopsies to evaluate for colitis. Please follow written instructions given to you at your visit today.  Please pick up your prep supplies at the pharmacy within the next 1-3 days. If you use inhalers (even only as needed), please bring them with you on the day of your procedure.  We have sent the following medications to your pharmacy for you to pick up at your convenience:

## 2019-04-18 ENCOUNTER — Other Ambulatory Visit: Payer: Self-pay | Admitting: Family Medicine

## 2019-04-18 NOTE — Telephone Encounter (Signed)
Last refill: 5.27.20 #30, 0 Last OV: 7.10.20 dx. CPE

## 2019-04-26 ENCOUNTER — Telehealth (HOSPITAL_COMMUNITY): Payer: Self-pay

## 2019-04-26 NOTE — Telephone Encounter (Signed)
New message    Just an FYI. We have made several attempts to contact this patient including sending a letter to schedule or reschedule their echocardiogram. We will be removing the patient from the echo [place].   8.7.20 @ 2:03pm lm on home vm - Norma [name]  7.10.20 @ 2:44pm lm on home vm  - Norma [name]  6.17.20 mail reminder letter Norma [name]

## 2019-04-27 NOTE — Telephone Encounter (Signed)
Noted! Thank you

## 2019-04-27 NOTE — Telephone Encounter (Signed)
Thanks.  She can contact us if she is interested in getting echocardiogram. Candee Furbish, MD

## 2019-05-18 ENCOUNTER — Encounter: Payer: BC Managed Care – PPO | Admitting: Gastroenterology

## 2019-05-22 ENCOUNTER — Encounter: Payer: Self-pay | Admitting: Gynecology

## 2019-05-24 ENCOUNTER — Ambulatory Visit: Payer: BC Managed Care – PPO | Admitting: Pulmonary Disease

## 2019-05-24 ENCOUNTER — Encounter: Payer: Self-pay | Admitting: Gastroenterology

## 2019-05-24 ENCOUNTER — Encounter: Payer: Self-pay | Admitting: Pulmonary Disease

## 2019-05-24 ENCOUNTER — Other Ambulatory Visit: Payer: Self-pay

## 2019-05-24 VITALS — BP 130/72 | HR 87 | Temp 97.7°F | Ht 67.0 in | Wt 253.6 lb

## 2019-05-24 DIAGNOSIS — E669 Obesity, unspecified: Secondary | ICD-10-CM

## 2019-05-24 DIAGNOSIS — G4733 Obstructive sleep apnea (adult) (pediatric): Secondary | ICD-10-CM | POA: Diagnosis not present

## 2019-05-24 DIAGNOSIS — G4730 Sleep apnea, unspecified: Secondary | ICD-10-CM | POA: Diagnosis not present

## 2019-05-24 NOTE — Patient Instructions (Signed)
Will arrange for new auto CPAP machine  Follow up in 2 months 

## 2019-05-24 NOTE — Progress Notes (Signed)
[facility]  Chief Complaint  Patient presents with  . OSA (obstructive sleep apnea)    CPAP working, but has an odd smell coming from it even when cleaning it daily.    Constitutional:  BP 130/72 (BP Location: Right Arm, Patient Position: Sitting, Cuff Size: Normal)   Pulse 87   Temp 97.7 F (36.5 C)   Ht 5\' 7"  (1.702 m)   Wt 253 lb 9.6 oz (115 kg)   SpO2 98%   BMI 39.72 kg/m   Past Medical History:  Anxiety, Allergies, HA, GERD  Brief Summary:  Norma [name] is a 48 y.o. female with obstructive sleep apnea.  She has been trying to use CPAP.  Was getting supplies from [facility].  Hasn't been able to reach them at there previous number.  Didn't realize they had changed to [employer].  She went on a trip abroad, and ever since then her machine has been giving off a funny smell.  Also not sure the pressure is delivering anymore.  She is having more snoring and trouble with her sleep if she can't use CPAP.  She has special needs daughter, and it has been an adjustment for her daughter to see her wearing a mask.   Physical Exam:   Appearance - well kempt   ENMT - clear nasal mucosa, midline nasal  septum, no oral exudates, no LAN, trachea midline, MP 3, elongated uvula, 2+ tonsils  Respiratory - normal chest wall, normal respiratory effort, no accessory muscle use, no wheeze/rales  CV - s1s2 regular rate and rhythm, no murmurs, no peripheral edema, radial pulses symmetric  GI - soft, non tender, no masses  Lymph - no adenopathy noted in neck and axillary areas  MSK - normal gait  Ext - no cyanosis, clubbing, or joint inflammation noted  Skin - no rashes, lesions, or ulcers  Neuro - normal strength, oriented x 3  Psych - normal mood and affect   Assessment/Plan:   Obstructive sleep apnea. - her CPAP machine is broke and can't be fixed; it is also more than 46 yrs old - she needs new set up - will arrange for auto CPAP range 5  to 15 cm H2O with heated humidity - will arrange for new supplies - advised her to try wearing the mask during the around her daughter to get her used to seeing her mom with a mask on - discussed alternative therapies for sleep apnea; she will d/w her dentist about whether oral appliance is an option, but for now would prefer to stick with CPAP  Obesity. - discussed importance of weight loss   Patient Instructions  Will arrange for new auto CPAP machine  Follow up in 2 months    Chesley Mires, MD [place] Pager: [phone] 05/24/2019, 5:01 PM  Flow Sheet    Sleep tests:  HST 03/14/14 >> AHI 26  Medications:   Allergies as of 05/24/2019      Reactions   Codeine Hives   Depakote [divalproex Sodium] Other (See Comments)   blisters      Medication List       Accurate as of May 24, 2019  5:01 PM. If you have any questions, ask your nurse or doctor.        azelastine 0.05 % ophthalmic solution Commonly known as: OPTIVAR INSTILL 1 DROP INTO RIGHT EYE TWICE A DAY   buPROPion 300 MG 24 hr tablet Commonly known as: WELLBUTRIN XL TAKE 1 TABLET  BY MOUTH EVERY DAY   calcium carbonate 500 MG chewable tablet Commonly known as: TUMS - dosed in mg elemental calcium Chew 2 tablets by mouth as needed for indigestion or heartburn.   CALCIUM PO Take 1 tablet by mouth daily. When remembers   cetirizine 10 MG tablet Commonly known as: ZYRTEC Take 10 mg by mouth daily.   clonazePAM 1 MG tablet Commonly known as: KLONOPIN TAKE 1 TABLET (1 MG TOTAL) BY MOUTH AT BEDTIME.   colestipol 1 g tablet Commonly known as: COLESTID Take 2 tablets (2 g total) by mouth 2 (two) times daily.   fexofenadine-pseudoephedrine 60-120 MG 12 hr tablet Commonly known as: ALLEGRA-D Take 1 tablet by mouth 2 (two) times daily.   ibuprofen 200 MG tablet Commonly known as: ADVIL Take 800 mg by mouth every 6 (six) hours as needed for moderate pain.   levonorgestrel  20 MCG/24HR IUD Commonly known as: MIRENA 1 each by Intrauterine route once.   multivitamin tablet Take 1 tablet by mouth daily.   omeprazole 20 MG capsule Commonly known as: PRILOSEC Take 20 mg by mouth daily.   triamcinolone ointment 0.1 % Commonly known as: KENALOG Apply 1 application topically 2 (two) times daily.   venlafaxine XR 150 MG 24 hr capsule Commonly known as: EFFEXOR-XR TAKE ONE CAPSULE EVERY DAY WITH BREAKFAST.       Past Surgical History:  She  has a past surgical history that includes BILATERAL FUSION (1992); Intrauterine device insertion (07/2015); mirena removed (spring 2017); Cholecystectomy (N/A, 08/13/2016); Laparoscopic abdominal exploration (08/13/2016); Intrauterine device insertion (05/19/2017); and Gallbladder surgery.  Family History:  Her family history includes Cardiomyopathy in her mother; Depression in her father; Heart disease in her maternal grandfather, paternal grandfather, and paternal grandmother; Heart failure in her maternal grandmother; Hypertension in her maternal grandfather, paternal grandfather, and paternal grandmother.  Social History:  She  reports that she has never smoked. She has never used smokeless tobacco. She reports current alcohol use. She reports that she does not use drugs.

## 2019-05-25 ENCOUNTER — Other Ambulatory Visit: Payer: Self-pay | Admitting: Family Medicine

## 2019-06-05 ENCOUNTER — Encounter: Payer: BC Managed Care – PPO | Admitting: Gastroenterology

## 2019-06-06 DIAGNOSIS — G4733 Obstructive sleep apnea (adult) (pediatric): Secondary | ICD-10-CM | POA: Diagnosis not present

## 2019-07-07 DIAGNOSIS — G4733 Obstructive sleep apnea (adult) (pediatric): Secondary | ICD-10-CM | POA: Diagnosis not present

## 2019-07-10 ENCOUNTER — Other Ambulatory Visit: Payer: Self-pay | Admitting: Gastroenterology

## 2019-07-10 DIAGNOSIS — J342 Deviated nasal septum: Secondary | ICD-10-CM | POA: Diagnosis not present

## 2019-07-10 DIAGNOSIS — J324 Chronic pansinusitis: Secondary | ICD-10-CM | POA: Diagnosis not present

## 2019-07-10 DIAGNOSIS — J343 Hypertrophy of nasal turbinates: Secondary | ICD-10-CM | POA: Diagnosis not present

## 2019-07-10 DIAGNOSIS — R519 Headache, unspecified: Secondary | ICD-10-CM | POA: Insufficient documentation

## 2019-07-10 DIAGNOSIS — G4733 Obstructive sleep apnea (adult) (pediatric): Secondary | ICD-10-CM | POA: Diagnosis not present

## 2019-07-10 NOTE — Telephone Encounter (Signed)
She is scheduled for a colonoscopy tomorrow! Should I go ahead and refill this or wait until after her procedure tomorrow to see current recommendations?

## 2019-07-10 NOTE — Telephone Encounter (Signed)
Norma [name], I would be happy to refill this prescription.  I am hoping it means that this is improving her recent symptoms.  I recommended a colonoscopy at the time of her consultation.  I want to make sure that she is aware of the new change in colon cancer screening guidelines.  The [place] preventative task force just updated their guidelines suggesting that colon cancer screening start at age 46.  Given these recommendations and her recent symptoms she may schedule a colonoscopy at her convenience.  Thank you.

## 2019-07-11 ENCOUNTER — Other Ambulatory Visit: Payer: Self-pay

## 2019-07-11 ENCOUNTER — Encounter: Payer: Self-pay | Admitting: Gastroenterology

## 2019-07-11 ENCOUNTER — Ambulatory Visit (AMBULATORY_SURGERY_CENTER): Payer: BC Managed Care – PPO | Admitting: Gastroenterology

## 2019-07-11 VITALS — BP 136/90 | HR 80 | Temp 98.7°F | Resp 17 | Ht 67.0 in | Wt 248.0 lb

## 2019-07-11 DIAGNOSIS — K2100 Gastro-esophageal reflux disease with esophagitis, without bleeding: Secondary | ICD-10-CM | POA: Diagnosis not present

## 2019-07-11 DIAGNOSIS — K2970 Gastritis, unspecified, without bleeding: Secondary | ICD-10-CM | POA: Diagnosis not present

## 2019-07-11 DIAGNOSIS — K648 Other hemorrhoids: Secondary | ICD-10-CM | POA: Diagnosis not present

## 2019-07-11 DIAGNOSIS — K219 Gastro-esophageal reflux disease without esophagitis: Secondary | ICD-10-CM | POA: Diagnosis not present

## 2019-07-11 DIAGNOSIS — R194 Change in bowel habit: Secondary | ICD-10-CM | POA: Diagnosis not present

## 2019-07-11 DIAGNOSIS — K2210 Ulcer of esophagus without bleeding: Secondary | ICD-10-CM | POA: Diagnosis not present

## 2019-07-11 DIAGNOSIS — R197 Diarrhea, unspecified: Secondary | ICD-10-CM

## 2019-07-11 DIAGNOSIS — K259 Gastric ulcer, unspecified as acute or chronic, without hemorrhage or perforation: Secondary | ICD-10-CM | POA: Diagnosis not present

## 2019-07-11 DIAGNOSIS — K3189 Other diseases of stomach and duodenum: Secondary | ICD-10-CM | POA: Diagnosis not present

## 2019-07-11 MED ORDER — OMEPRAZOLE 40 MG PO CPDR: 40.0000 mg | DELAYED_RELEASE_CAPSULE | Freq: Two times a day (BID) | ORAL | 0 refills | Status: DC

## 2019-07-11 MED ORDER — SODIUM CHLORIDE 0.9 % IV SOLN: 500.0000 mL | INTRAVENOUS | Status: DC

## 2019-07-11 NOTE — Op Note (Signed)
[facility] Patient Name: Norma [name] Procedure Date: 07/11/2019 10:20 AM MRN: [id number] Endoscopist: Thornton Park MD, MD Age: 48 Referring MD:  Date of Birth: 08/02/1971 Gender: Female Account #: [id number] Procedure:                Upper GI endoscopy Indications:              Diarrhea                           Altered bowel habits. Severe urgency with                            associated abdominal cramping, change in stool                            consistency that alternates between explosive                            watery, loose and gloppy, and constipation. Altered Medicines:                Monitored Anesthesia Care Procedure:                Pre-Anesthesia Assessment:                           - Prior to the procedure, a History and Physical                            was performed, and patient medications and                            allergies were reviewed. The patient's tolerance of                            previous anesthesia was also reviewed. The risks                            and benefits of the procedure and the sedation                            options and risks were discussed with the patient.                            All questions were answered, and informed consent                            was obtained. Prior Anticoagulants: The patient has                            taken no previous anticoagulant or antiplatelet                            agents. ASA Grade Assessment: II - A patient with  mild systemic disease. After reviewing the risks                            and benefits, the patient was deemed in                            satisfactory condition to undergo the procedure.                           After obtaining informed consent, the endoscope was                            passed under direct vision. Throughout the                            procedure, the patient's blood pressure, pulse, and                             oxygen saturations were monitored continuously. The                            Endoscope was introduced through the mouth, and                            advanced to the third part of duodenum. The upper                            GI endoscopy was accomplished without difficulty.                            The patient tolerated the procedure well. Scope In: Scope Out: Findings:                 LA Grade C (one or more mucosal breaks continuous                            between tops of 2 or more mucosal folds, less than                            75% circumference) esophagitis with no bleeding was                            found. Biopsies were taken with a cold forceps for                            histology. Estimated blood loss was minimal.                           Multiple localized small erosions with no bleeding                            and no stigmata of recent bleeding were found in  the gastric antrum. Biopsies were taken from the                            antrum, body, and fundus with a cold forceps for                            histology. Estimated blood loss was minimal.                           The examined duodenum was normal. Biopsies were                            taken with a cold forceps for histology. Estimated                            blood loss was minimal.                           The exam was otherwise without abnormality. Complications:            No immediate complications. Estimated blood loss:                            Minimal. Estimated Blood Loss:     Estimated blood loss was minimal. Impression:               - LA Grade C esophagitis with no bleeding. Biopsied.                           - Erosive gastropathy with no bleeding and no                            stigmata of recent bleeding. Biopsied.                           - Normal examined duodenum. Biopsied.                           - The  examination was otherwise normal. Recommendation:           - Patient has a contact number available for                            emergencies. The signs and symptoms of potential                            delayed complications were discussed with the                            patient. Return to normal activities tomorrow.                            Written discharge instructions were provided to the  patient.                           - Resume previous diet today.                           - Continue present medications. Increase omeprazole                            to 40 mg twice daily for 8 weeks.                           - GERD lifestyle modifications.                           - Await pathology results.                           - Proceed with colonoscopy as previously planned Tressia Danas MD, MD 07/11/2019 11:10:26 AM This report has been signed electronically.

## 2019-07-11 NOTE — Progress Notes (Signed)
Report to PACU, RN, vss, BBS= Clear.  

## 2019-07-11 NOTE — Op Note (Signed)
[facility] Patient Name: Norma InglesDean Marrone-edwards Procedure Date: 07/11/2019 10:20 AM MRN: [id number] Endoscopist: Tressia DanasKimberly Donevin Sainsbury MD, MD Age: 48 Referring MD:  Date of Birth: 09/10/1970 Gender: Female Account #: [id number] Procedure:                Colonoscopy Indications:              Change in bowel habits. Severe urgency with                            associated abdominal cramping, change in stool                            consistency that alternates between explosive                            watery, loose and gloppy, and constipation. No                            known family history of colon cancer or polyps. Medicines:                Monitored Anesthesia Care Procedure:                Pre-Anesthesia Assessment:                           - Prior to the procedure, a History and Physical                            was performed, and patient medications and                            allergies were reviewed. The patient's tolerance of                            previous anesthesia was also reviewed. The risks                            and benefits of the procedure and the sedation                            options and risks were discussed with the patient.                            All questions were answered, and informed consent                            was obtained. Prior Anticoagulants: The patient has                            taken no previous anticoagulant or antiplatelet                            agents. ASA Grade Assessment: II - A patient with  mild systemic disease. After reviewing the risks                            and benefits, the patient was deemed in                            satisfactory condition to undergo the procedure.                           After obtaining informed consent, the colonoscope                            was passed under direct vision. Throughout the                            procedure, the  patient's blood pressure, pulse, and                            oxygen saturations were monitored continuously. The                            Colonoscope was introduced through the anus and                            advanced to the the terminal ileum, with                            identification of the appendiceal orifice and IC                            valve. A second forward view of the right colon was                            performed. The colonoscopy was performed without                            difficulty. The patient tolerated the procedure                            well. The quality of the bowel preparation was                            good. The terminal ileum, ileocecal valve,                            appendiceal orifice, and rectum were photographed. Scope In: 10:52:29 AM Scope Out: 11:04:54 AM Scope Withdrawal Time: 0 hours 9 minutes 35 seconds  Total Procedure Duration: 0 hours 12 minutes 25 seconds  Findings:                 The perianal and digital rectal examinations were                            normal.  The colon (entire examined portion) appeared                            normal. Biopsies for histology were taken with a                            cold forceps from the right colon and left colon                            for evaluation of microscopic colitis. Estimated                            blood loss was minimal.                           The exam was otherwise without abnormality on                            direct and retroflexion views except for small                            internal hemorrhoids. Complications:            No immediate complications. Estimated blood loss:                            Minimal. Estimated Blood Loss:     Estimated blood loss was minimal. Impression:               - The entire examined colon is normal. Biopsied.                           - Small internal hemorrhoids.                            - The examination was otherwise normal on direct                            and retroflexion views. Recommendation:           - Patient has a contact number available for                            emergencies. The signs and symptoms of potential                            delayed complications were discussed with the                            patient. Return to normal activities tomorrow.                            Written discharge instructions were provided to the                            patient.                           -  Resume previous diet today.                           - Continue present medications.                           - Await pathology results.                           - Repeat colonoscopy in 10 years for screening                            purposes. Tressia Danas MD, MD 07/11/2019 11:14:33 AM This report has been signed electronically.

## 2019-07-11 NOTE — Progress Notes (Signed)
No problems noted in the recovery room. Maw  Pt asked Dr. Tarri Glenn to speak with her husband about the findings.  Dr. Tarri Glenn called pt's husband. maw

## 2019-07-11 NOTE — Patient Instructions (Addendum)
YOU HAD AN ENDOSCOPIC PROCEDURE TODAY AT THE [facility]:   Refer to the procedure report that was given to you for any specific questions about what was found during the examination.  If the procedure report does not answer your questions, please call your gastroenterologist to clarify.  If you requested that your care partner not be given the details of your procedure findings, then the procedure report has been included in a sealed envelope for you to review at your convenience later.  YOU SHOULD EXPECT: Some feelings of bloating in the abdomen. Passage of more gas than usual.  Walking can help get rid of the air that was put into your GI tract during the procedure and reduce the bloating. If you had a lower endoscopy (such as a colonoscopy or flexible sigmoidoscopy) you may notice spotting of blood in your stool or on the toilet paper. If you underwent a bowel prep for your procedure, you may not have a normal bowel movement for a few days.  Please Note:  You might notice some irritation and congestion in your nose or some drainage.  This is from the oxygen used during your procedure.  There is no need for concern and it should clear up in a day or so.  SYMPTOMS TO REPORT IMMEDIATELY:   Following lower endoscopy (colonoscopy or flexible sigmoidoscopy):  Excessive amounts of blood in the stool  Significant tenderness or worsening of abdominal pains  Swelling of the abdomen that is new, acute  Fever of 100F or higher   Following upper endoscopy (EGD)  Vomiting of blood or coffee ground material  New chest pain or pain under the shoulder blades  Painful or persistently difficult swallowing  New shortness of breath  Fever of 100F or higher  Black, tarry-looking stools  For urgent or emergent issues, a gastroenterologist can be reached at any hour by calling [phone].   DIET:  We do recommend a small meal at first, but then you may proceed to your regular diet.  Drink  plenty of fluids but you should avoid alcoholic beverages for 24 hours.  ACTIVITY:  You should plan to take it easy for the rest of today and you should NOT DRIVE or use heavy machinery until tomorrow (because of the sedation medicines used during the test).    FOLLOW UP: Our staff will call the number listed on your records 48-72 hours following your procedure to check on you and address any questions or concerns that you may have regarding the information given to you following your procedure. If we do not reach you, we will leave a message.  We will attempt to reach you two times.  During this call, we will ask if you have developed any symptoms of COVID 19. If you develop any symptoms (ie: fever, flu-like symptoms, shortness of breath, cough etc.) before then, please call [phone].  If you test positive for Covid 19 in the 2 weeks post procedure, please call and report this information to us.    If any biopsies were taken you will be contacted by phone or by letter within the next 1-3 weeks.  Please call us at [phone] if you have not heard about the biopsies in 3 weeks.    SIGNATURES/CONFIDENTIALITY: You and/or your care partner have signed paperwork which will be entered into your electronic medical record.  These signatures attest to the fact that that the information above on your After Visit Summary has been reviewed and is   understood.  Full responsibility of the confidentiality of this discharge information lies with you and/or your care-partner.     Handouts were given to you on Esophagitis and GERD with lifestyle modifications. Per Dr. Tarri Glenn increase OMEPRAZOLE to 40 mg twice daily for 8 weeks.  Prescription was sent to [facility]. You may resume your current medications today. Await biopsy results. Please call if any questions or concerns.

## 2019-07-13 ENCOUNTER — Telehealth: Payer: Self-pay

## 2019-07-13 NOTE — Telephone Encounter (Signed)
  Follow up Call-  Call back number 07/11/2019  Post procedure Call Back phone  # [phone]  Permission to leave phone message Yes  Some recent data might be hidden     Patient questions:  Do you have a fever, pain , or abdominal swelling? No. Pain Score  0 *  Have you tolerated food without any problems? Yes.    Have you been able to return to your normal activities? Yes.    Do you have any questions about your discharge instructions: Diet   No. Medications  No. Follow up visit  No.  Do you have questions or concerns about your Care? No.  Actions: * If pain score is 4 or above: No action needed, pain <4.  1. Have you developed a fever since your procedure? No  2.   Have you had an respiratory symptoms (SOB or cough) since your procedure? No  3.   Have you tested positive for COVID 19 since your procedure No 4.   Have you had any family members/close contacts diagnosed with the COVID 19 since your procedure?  No  If yes to any of these questions please route to Joylene John, RN and Alphonsa Gin, RN.

## 2019-07-17 ENCOUNTER — Encounter: Payer: Self-pay | Admitting: *Deleted

## 2019-07-30 ENCOUNTER — Encounter: Payer: Self-pay | Admitting: Pulmonary Disease

## 2019-07-30 ENCOUNTER — Other Ambulatory Visit: Payer: Self-pay

## 2019-07-30 ENCOUNTER — Ambulatory Visit: Payer: BC Managed Care – PPO | Admitting: Pulmonary Disease

## 2019-07-30 VITALS — BP 130/86 | HR 81 | Temp 97.9°F | Ht 67.0 in | Wt 258.0 lb

## 2019-07-30 DIAGNOSIS — F458 Other somatoform disorders: Secondary | ICD-10-CM | POA: Diagnosis not present

## 2019-07-30 DIAGNOSIS — J342 Deviated nasal septum: Secondary | ICD-10-CM

## 2019-07-30 DIAGNOSIS — J351 Hypertrophy of tonsils: Secondary | ICD-10-CM

## 2019-07-30 DIAGNOSIS — G4733 Obstructive sleep apnea (adult) (pediatric): Secondary | ICD-10-CM | POA: Diagnosis not present

## 2019-07-30 DIAGNOSIS — G4730 Sleep apnea, unspecified: Secondary | ICD-10-CM

## 2019-07-30 DIAGNOSIS — J329 Chronic sinusitis, unspecified: Secondary | ICD-10-CM | POA: Diagnosis not present

## 2019-07-30 DIAGNOSIS — E669 Obesity, unspecified: Secondary | ICD-10-CM

## 2019-07-30 NOTE — Progress Notes (Signed)
[facility]  Chief Complaint  Patient presents with  . OSA (obstructive sleep apnea)    Constitutional:  BP 130/86 (BP Location: Right Arm, Patient Position: Sitting, Cuff Size: Large)   Pulse 81   Temp 97.9 F (36.6 C)   Ht 5\' 7"  (1.702 m)   Wt 258 lb (117 kg)   SpO2 100% Comment: on room air  BMI 40.41 kg/m   Past Medical History:  Anxiety, Allergies, HA, GERD  Brief Summary:  Norma [name] is a 48 y.o. female with obstructive sleep apnea.  Since her last visit in September she got a new CPAP machine.  She started having trouble with her sinus and reflux.  Seen by Dr. October with ENT and Dr. Annalee Genta with GI.  Treated with levaquin x 2 weeks, and nasal irrigation/flonase by Dr. Christain Sacramento with plan to f/u CT sinus in December 2020.  Had EGD and colonoscopy with Dr. January 2021 - found to have esophagitis.  She was started on omeprazole 40 mg bid for 8 weeks.  CPAP report shows average AHI 2.2 with median CPAP 9 and 95 th percentile CPAP 11 cm H2O.  She is still having trouble with her sinuses.  Feels like her tonsils are swollen.  Has to breath through her mouth when asleep.  Feels like new machine pressure is too forceful, and getting bloated and gassy at night from air building up in her stomach.  Was getting pressure from her mask on her sinuses, but better after she got a large size full face mask.   Physical Exam:   Appearance - well kempt   ENMT - no sinus tenderness, deviated nasal septum, no nasal discharge, no oral exudate, MP 2, 2+ tonils  Neck - no masses, trachea midline, no thyromegaly, no elevation in JVP  Respiratory - normal appearance of chest wall, normal respiratory effort w/o accessory muscle use, no dullness on percussion, no wheezing or rales  CV - s1s2 regular rate and rhythm, no murmurs, no peripheral edema, radial pulses symmetric  GI - soft, non tender  Lymph - no adenopathy noted in neck and axillary  areas  MSK - normal gait  Ext - no cyanosis, clubbing, or joint inflammation noted  Skin - no rashes, lesions, or ulcers  Neuro - normal strength, oriented x 3  Psych - normal mood and affect   Assessment/Plan:   Obstructive sleep apnea. - having difficulty using CPAP due to sinus issues and deviated nasal septum; also having trouble with aerophagia since getting new machine - noted to have persistently enlarged tonsils - will change auto CPAP to 5 - 9 cm H2O - discussed options to improve mask fit - continue nasal irrigation and flonase - advised her to d/w Dr. Christain Sacramento about whether she is a candidate for surgical intervention to treat her OSA if she is also going to need sinus/nasal septum surgery  Recurrent sinusitis. - noted to have nasal septal deviation with significant obstruction and turbinate hypertrophy - she has f/u with Dr. Annalee Genta in December 2020 after getting CT sinuses  Obesity. - discussed importance of weight loss   Patient Instructions  Will have [employer] change your auto CPAP setting to 5 - 9 cm water  Follow up in 6 months  A total of  26 minutes were spent face to face with the patient and more than half of that time involved counseling or coordination of care.   January 2021, MD [facility] Pager: [phone] 07/30/2019,  9:27 AM  Flow Sheet    Sleep tests:  HST 03/14/14 >> AHI 26  Medications:   Allergies as of 07/30/2019      Reactions   Codeine Hives   Depakote [divalproex Sodium] Other (See Comments)   blisters      Medication List       Accurate as of July 30, 2019  9:27 AM. If you have any questions, ask your nurse or doctor.        azelastine 0.05 % ophthalmic solution Commonly known as: OPTIVAR INSTILL 1 DROP INTO RIGHT EYE TWICE A DAY   buPROPion 300 MG 24 hr tablet Commonly known as: WELLBUTRIN XL TAKE 1 TABLET BY MOUTH EVERY DAY   calcium carbonate 500 MG chewable tablet Commonly known  as: TUMS - dosed in mg elemental calcium Chew 2 tablets by mouth as needed for indigestion or heartburn.   CALCIUM PO Take 1 tablet by mouth daily. When remembers   cetirizine 10 MG tablet Commonly known as: ZYRTEC Take 10 mg by mouth daily.   clonazePAM 1 MG tablet Commonly known as: KLONOPIN TAKE 1 TABLET (1 MG TOTAL) BY MOUTH AT BEDTIME.   colestipol 1 g tablet Commonly known as: COLESTID TAKE 2 TABLETS (2 G TOTAL) BY MOUTH 2 (TWO) TIMES DAILY.   fexofenadine-pseudoephedrine 60-120 MG 12 hr tablet Commonly known as: ALLEGRA-D Take 1 tablet by mouth 2 (two) times daily.   fluticasone 50 MCG/ACT nasal spray Commonly known as: FLONASE Place 2 sprays into both nostrils daily.   ibuprofen 200 MG tablet Commonly known as: ADVIL Take 800 mg by mouth every 6 (six) hours as needed for moderate pain.   levonorgestrel 20 MCG/24HR IUD Commonly known as: MIRENA 1 each by Intrauterine route once.   multivitamin tablet Take 1 tablet by mouth daily.   omeprazole 40 MG capsule Commonly known as: PRILOSEC Take 1 capsule (40 mg total) by mouth 2 (two) times daily. Best to take on an empty stomach 20- 30 minutes before food. What changed: Another medication with the same name was removed. Continue taking this medication, and follow the directions you see here. Changed by: Chesley Mires, MD   triamcinolone ointment 0.1 % Commonly known as: KENALOG Apply 1 application topically 2 (two) times daily.   venlafaxine XR 150 MG 24 hr capsule Commonly known as: EFFEXOR-XR TAKE ONE CAPSULE EVERY DAY WITH BREAKFAST.       Past Surgical History:  She  has a past surgical history that includes BILATERAL FUSION (1992); Intrauterine device insertion (07/2015); mirena removed (spring 2017); Cholecystectomy (N/A, 08/13/2016); Laparoscopic abdominal exploration (08/13/2016); Intrauterine device insertion (05/19/2017); and Gallbladder surgery.  Family History:  Her family history includes  Cardiomyopathy in her mother; Depression in her father; Heart disease in her maternal grandfather, paternal grandfather, and paternal grandmother; Heart failure in her maternal grandmother; Hypertension in her maternal grandfather, paternal grandfather, and paternal grandmother.  Social History:  She  reports that she has never smoked. She has never used smokeless tobacco. She reports current alcohol use. She reports that she does not use drugs.

## 2019-07-30 NOTE — Patient Instructions (Signed)
Will have [employer] change your auto CPAP setting to 5 - 9 cm water  Follow up in 6 months

## 2019-08-02 DIAGNOSIS — G4733 Obstructive sleep apnea (adult) (pediatric): Secondary | ICD-10-CM | POA: Diagnosis not present

## 2019-08-03 ENCOUNTER — Other Ambulatory Visit: Payer: Self-pay | Admitting: Gastroenterology

## 2019-08-03 DIAGNOSIS — K2970 Gastritis, unspecified, without bleeding: Secondary | ICD-10-CM

## 2019-08-13 DIAGNOSIS — J019 Acute sinusitis, unspecified: Secondary | ICD-10-CM | POA: Diagnosis not present

## 2019-08-13 DIAGNOSIS — R519 Headache, unspecified: Secondary | ICD-10-CM | POA: Diagnosis not present

## 2019-08-13 DIAGNOSIS — J342 Deviated nasal septum: Secondary | ICD-10-CM | POA: Diagnosis not present

## 2019-08-13 DIAGNOSIS — J343 Hypertrophy of nasal turbinates: Secondary | ICD-10-CM | POA: Diagnosis not present

## 2019-08-13 DIAGNOSIS — G4733 Obstructive sleep apnea (adult) (pediatric): Secondary | ICD-10-CM | POA: Diagnosis not present

## 2019-08-13 DIAGNOSIS — J324 Chronic pansinusitis: Secondary | ICD-10-CM | POA: Diagnosis not present

## 2019-08-21 ENCOUNTER — Other Ambulatory Visit: Payer: Self-pay | Admitting: Family Medicine

## 2019-08-25 ENCOUNTER — Other Ambulatory Visit: Payer: Self-pay | Admitting: Family Medicine

## 2019-08-29 ENCOUNTER — Other Ambulatory Visit: Payer: Self-pay | Admitting: Family Medicine

## 2019-08-31 ENCOUNTER — Other Ambulatory Visit: Payer: Self-pay | Admitting: Gastroenterology

## 2019-08-31 DIAGNOSIS — K2970 Gastritis, unspecified, without bleeding: Secondary | ICD-10-CM

## 2019-09-13 ENCOUNTER — Ambulatory Visit (INDEPENDENT_AMBULATORY_CARE_PROVIDER_SITE_OTHER): Payer: 59 | Admitting: Gastroenterology

## 2019-09-13 ENCOUNTER — Encounter: Payer: Self-pay | Admitting: Gastroenterology

## 2019-09-13 VITALS — BP 112/78 | HR 64 | Temp 97.5°F | Ht 67.0 in | Wt 256.2 lb

## 2019-09-13 DIAGNOSIS — K2210 Ulcer of esophagus without bleeding: Secondary | ICD-10-CM | POA: Diagnosis not present

## 2019-09-13 DIAGNOSIS — R197 Diarrhea, unspecified: Secondary | ICD-10-CM | POA: Diagnosis not present

## 2019-09-13 DIAGNOSIS — Z9049 Acquired absence of other specified parts of digestive tract: Secondary | ICD-10-CM

## 2019-09-13 DIAGNOSIS — K9189 Other postprocedural complications and disorders of digestive system: Secondary | ICD-10-CM

## 2019-09-13 DIAGNOSIS — K209 Esophagitis, unspecified without bleeding: Secondary | ICD-10-CM

## 2019-09-13 NOTE — Patient Instructions (Addendum)
Continue to take omeprazole 40 mg twice daily. Continue colestipol 2 g twice daily. Let's plan to follow-up in May 2021. Please call me with any questions or concerns in the meantime.   If you are age 47 or older, your body mass index should be between 23-30. Your Body mass index is 40.13 kg/m. If this is out of the aforementioned range listed, please consider follow up with your Primary Care Provider.  If you are age 29 or younger, your body mass index should be between 19-25. Your Body mass index is 40.13 kg/m. If this is out of the aformentioned range listed, please consider follow up with your Primary Care Provider.

## 2019-09-13 NOTE — Progress Notes (Signed)
Referring Provider: Sheliah Hatch, MD Primary Care Physician:  Sheliah Hatch, MD   Chief complaint: Alternating diarrhea and constipation   IMPRESSION:  LA Class C Reflux with esophageal ulceration on EGD 07/11/19 Alternating bowel habits    - normal colonoscopy 07/11/19 including negative biopsies for microscopic colitis    - normal duodenal biopsies 07/11/19 Prior cholecystectomy 2017 for chronic cholecystitis BMI 40 Fatty liver on ultrasound 2017 Family history of autoimmune disease (mother with psoriasis) Colonoscopy with Dr. Loreta Ave around 2003 No known family history of colon cancer or polyps  Altered bowel habits for several years.  Severe urgency with associated abdominal cramping, change in stool consistency that alternates between explosive watery, loose and gloppy, and constipation. No alarm features.  Suspected bile-acid diarrhea or IBS. Thankfully, her symptoms have improved on colestipol.   Extensive esophagitis with esophageal ulceration without associated symptoms except for dysphonia. No anemia noted on most recent labs. Continue PPI BID. Consider repeat EGD after sinus surgery if her symptoms persist given the asymptomatic nature of her esophagitis and ulceration.   Plan: Continue Omeprazole 40 mg BID Reviewed GERD lifestyle modifications including working to maintain a healthy weight Continue colestipol 2 g once BID Follow-up May 2021, earlier as needed Plan repeat EGD after surgery Colonoscopy due 2030  HPI: Norma [name] is a 49 y.o. female  under evaluation of alternating diarrhea and constipation.  She returns after her initial consultation and endoscopic evaluation. Interval history is obtained through the patient and review of her electronic health record.  She has anxiety, depression, headaches, sleep apnea, and obesity.  She had a cholecystectomy in 2017 for mild chronic cholecystitis.  Previously seen by Dr. Loreta Ave in 2003 for abdominal  pain and constipation.  She has had several years of altered bowel habits. Intermittent severe urge to defecate. Severe straining with severe abdominal cramping while trying to defecate that occurs a few times each week. When she finally goes it's followed by diarrhea. She will have a day or two or loose stools. Bowel habits then return to normal   Stools are a yellow gloppy consistency. Greasy foods trigger her symptoms. Symptoms similar  to prior her cholecystectomy.  Feels like things are worse since then, not better. Some association with stress, but, she doesn't think this is different from her baseline.  Previously treated with Tums PRN and Peptobismal PRN. Has not recently used Imodium. Previously evaluated by Dr. Loreta Ave with a colonoscopy in 2003.    EGD 07/11/19:  - LA Grade C reflux esophagitis with ulceration. No intestinal metaplasia.  - Multiple gastric erosions in the antrum. H pylori negative gastropathy on biopsies.  - Normal duodenum was normal. Duodenal biopsies were normal.   Colonoscopy 07/11/19" - The perianal and digital rectal examinations were normal. - The colon (entire examined portion) appeared normal. - Normal right sided and left sided colon biopsies.    Using fewer tums on omeprazole 40 mg BID. Voice remains raspy. Frequent voice clearing. No additional choking episodes No heartburn, dysphagia, or odynophagia Persistent daily sore throat and ear pain.  Remains surprised by endoscopic findings Significant post-nasal drip.  Saw Dr. Annalee Genta with ENT Sinus surgery and tonsillectomy planned in March. Pulmonologist thinks it will help with her sleep apnea. Colestipol has improved her diarrhea and abdominal pain. Worse with foods like bacon.   Labs 03/09/19: hemoglobin 13.2, MCV 87.4, RDW 13.9   Prior abdominal imaging: Abd ultrasound 06/12/16: hepatic steatosis CT abd/pel with contrast 06/12/16:  No acute findings. Specifically,  no findings to explain the  patient's history of right upper quadrant pain. Very short segment jejunal enteroenteric intussusception without wall thickening, obstruction, or perienteric edema/inflammation. This is most likely a transient process. HIDA with CCK 07/16/16: GBEF 10%. Patent cystic duct and common bile duct.   Past Medical History:  Diagnosis Date  . Anxiety   . ASCUS (atypical squamous cells of undetermined significance) on Pap smear 07   NEG HPV; BENIGN CHANGES 4/08  . Chronic back pain   . Claustrophobia    with mask on  . Family history of adverse reaction to anesthesia    daughter Aundra Millet low blood pressure during ear tube surgery 2 years ago  . GERD (gastroesophageal reflux disease)   . Headache    hx migraines once a year or so now  . Sleep apnea    C PAP  . 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 Urinary incontinence    Stress incontinence    Past Surgical History:  Procedure Laterality Date  . BILATERAL FUSION  31   age 29--Foot  . CHOLECYSTECTOMY N/A 08/13/2016   Procedure: LAPAROSCOPIC CHOLECYSTECTOMY WITH INTRAOPERATIVE CHOLANGIOGRAM;  Surgeon: Avel Peace, MD;  Location: [facility];  Service: General;  Laterality: N/A;  . GALLBLADDER SURGERY    . INTRAUTERINE DEVICE INSERTION  07/2015   Mirena  . INTRAUTERINE DEVICE INSERTION  05/19/2017   Mirena  . LAPAROSCOPIC ABDOMINAL EXPLORATION  08/13/2016   Procedure: LAPAROSCOPIC  SMALL BOWEL EXPLORATION;  Surgeon: Avel Peace, MD;  Location: [facility];  Service: General;;  Toney Reil removed  spring 2017    Current Outpatient Medications  Medication Sig Dispense Refill  . azelastine (OPTIVAR) 0.05 % ophthalmic solution INSTILL 1 DROP INTO RIGHT EYE TWICE A DAY 6 mL 1  . buPROPion (WELLBUTRIN XL) 300 MG 24 hr tablet TAKE 1 TABLET BY MOUTH EVERY DAY 90 tablet 2  . calcium carbonate (TUMS - DOSED IN MG ELEMENTAL CALCIUM) 500 MG chewable tablet Chew 2 tablets by mouth as needed for indigestion or heartburn.    Marland Kitchen  CALCIUM PO Take 1 tablet by mouth daily. When remembers    . cetirizine (ZYRTEC) 10 MG tablet Take 10 mg by mouth daily.    . clonazePAM (KLONOPIN) 1 MG tablet TAKE 1 TABLET (1 MG TOTAL) BY MOUTH AT BEDTIME. 30 tablet 3  . colestipol (COLESTID) 1 g tablet TAKE 2 TABLETS (2 G TOTAL) BY MOUTH 2 (TWO) TIMES DAILY. 360 tablet 5  . fexofenadine-pseudoephedrine (ALLEGRA-D) 60-120 MG 12 hr tablet Take 1 tablet by mouth 2 (two) times daily. 60 tablet 0  . fluticasone (FLONASE) 50 MCG/ACT nasal spray Place 2 sprays into both nostrils daily.    Marland Kitchen ibuprofen (ADVIL,MOTRIN) 200 MG tablet Take 800 mg by mouth every 6 (six) hours as needed for moderate pain.    Marland Kitchen levonorgestrel (MIRENA) 20 MCG/24HR IUD 1 each by Intrauterine route once.    . Multiple Vitamin (MULTIVITAMIN) tablet Take 1 tablet by mouth daily.    Marland Kitchen omeprazole (PRILOSEC) 40 MG capsule TAKE 1 CAPSULE BY MOUTH TWICE A DAY BEST TO TAKE ON EMPTY STOMACH 20-30 MINUTES BEFORE FOOD 60 capsule 1  . triamcinolone ointment (KENALOG) 0.1 % APPLY TO AFFECTED AREA TWICE A DAY 90 g 1  . venlafaxine XR (EFFEXOR-XR) 150 MG 24 hr capsule TAKE ONE CAPSULE EVERY DAY WITH BREAKFAST. 90 capsule 0   No current facility-administered medications for this visit.    Allergies as of 09/13/2019 -  Review Complete 07/30/2019  Allergen Reaction Noted  . Codeine Hives 11/22/2008  . Depakote [divalproex sodium] Other (See Comments) 01/03/2013    Family History  Problem Relation Age of Onset  . Hypertension Maternal Grandfather   . Heart disease Maternal Grandfather   . Hypertension Paternal Grandmother   . Heart disease Paternal Grandmother   . Hypertension Paternal Grandfather   . Heart disease Paternal Grandfather   . Depression Father   . Heart failure Maternal Grandmother   . Cardiomyopathy Mother     Social History   Socioeconomic History  . Marital status: Married    Spouse name: Not on file  . Number of children: Not on file  . Years of education: Not  on file  . Highest education level: Not on file  Occupational History  . Not on file  Tobacco Use  . Smoking status: Never Smoker  . Smokeless tobacco: Never Used  Substance and Sexual Activity  . Alcohol use: Yes    Alcohol/week: 0.0 standard drinks    Comment: SOCIALLY  . Drug use: No  . Sexual activity: Yes    Birth control/protection: I.U.D.    Comment: 1st intercourse 48 yo-Fewer than 5 partners Mirena IUD 05/19/2017  Other Topics Concern  . Not on file  Social History Narrative  . Not on file   Social Determinants of Health   Financial Resource Strain:   . Difficulty of Paying Living Expenses: Not on file  Food Insecurity:   . Worried About Charity fundraiser in the Last Year: Not on file  . Ran Out of Food in the Last Year: Not on file  Transportation Needs:   . Lack of Transportation (Medical): Not on file  . Lack of Transportation (Non-Medical): Not on file  Physical Activity:   . Days of Exercise per Week: Not on file  . Minutes of Exercise per Session: Not on file  Stress:   . Feeling of Stress : Not on file  Social Connections:   . Frequency of Communication with Friends and Family: Not on file  . Frequency of Social Gatherings with Friends and Family: Not on file  . Attends Religious Services: Not on file  . Active Member of Clubs or Organizations: Not on file  . Attends Archivist Meetings: Not on file  . Marital Status: Not on file  Intimate Partner Violence:   . Fear of Current or Ex-Partner: Not on file  . Emotionally Abused: Not on file  . Physically Abused: Not on file  . Sexually Abused: Not on file    Physical Exam: General:   Alert,  well-nourished, pleasant and cooperative in NAD. Raspy voice.  Head:  Normocephalic and atraumatic. Abdomen:  Soft, nontender, nondistended, normal bowel sounds, no rebound or guarding. No hepatosplenomegaly.   Neurologic:  Alert and  oriented x4;  grossly nonfocal Skin:  Intact without significant  lesions or rashes. Psych:  Alert and cooperative. Normal mood and affect.      Adian Jablonowski L. Tarri Glenn, MD, MPH 09/13/2019, 9:23 AM

## 2019-09-25 ENCOUNTER — Other Ambulatory Visit: Payer: Self-pay | Admitting: Gastroenterology

## 2019-09-25 DIAGNOSIS — K2970 Gastritis, unspecified, without bleeding: Secondary | ICD-10-CM

## 2019-11-25 ENCOUNTER — Other Ambulatory Visit: Payer: Self-pay | Admitting: Family Medicine

## 2019-11-28 ENCOUNTER — Other Ambulatory Visit: Payer: Self-pay | Admitting: Family Medicine

## 2019-11-29 NOTE — Telephone Encounter (Signed)
Clonazepam 1mg  tab Last office visit 03/09/19 with no future visit scheduled. Medication last ordered on 04/18/19 #30 with 3 refills Last refilled on 09/05/19. Please advise.

## 2019-12-19 ENCOUNTER — Other Ambulatory Visit: Payer: Self-pay | Admitting: Otolaryngology

## 2019-12-28 ENCOUNTER — Other Ambulatory Visit: Payer: Self-pay | Admitting: Family Medicine

## 2019-12-28 NOTE — Telephone Encounter (Signed)
Last OV 03/09/19 CLonazepam last filled 04/18/19 #30 with 3

## 2019-12-29 ENCOUNTER — Encounter (HOSPITAL_COMMUNITY): Payer: Self-pay | Admitting: Otolaryngology

## 2019-12-29 HISTORY — PX: TONSILECTOMY, ADENOIDECTOMY, BILATERAL MYRINGOTOMY AND TUBES: SHX2538

## 2019-12-29 HISTORY — PX: OTHER SURGICAL HISTORY: SHX169

## 2019-12-29 NOTE — Progress Notes (Signed)
Patient denies chest pain or shortness of breath. States she visited with Dr. Anne Fu in 10/2018 for family history of hypertropic cardiomyopathy. Dr. Anne Fu recommended cardiac testing. She reports due to the pandemic and having a child with trisomy 21 she elected to hold off on cardiac test to limit risk of exposure to COVID-19. States she is currently unable to use CPAP due to her tonsil and sinus issues.   Patient educated on need to quarantine post COVID test on Monday. Patient educated on visitation policy. Patient educated that urine specimen would be needed when she arrived to Short Stay. Chart sent to anesthesia to review cardiac notes.

## 2019-12-31 ENCOUNTER — Other Ambulatory Visit (HOSPITAL_COMMUNITY)
Admission: RE | Admit: 2019-12-31 | Discharge: 2019-12-31 | Disposition: A | Discharge status: Home / Self Care | Payer: 59 | Source: Ambulatory Visit | Attending: Otolaryngology | Admitting: Otolaryngology

## 2019-12-31 ENCOUNTER — Encounter (HOSPITAL_COMMUNITY): Payer: Self-pay | Admitting: Otolaryngology

## 2019-12-31 DIAGNOSIS — Z01812 Encounter for preprocedural laboratory examination: Secondary | ICD-10-CM | POA: Insufficient documentation

## 2019-12-31 DIAGNOSIS — Z20822 Contact with and (suspected) exposure to covid-19: Secondary | ICD-10-CM | POA: Insufficient documentation

## 2019-12-31 LAB — SARS CORONAVIRUS 2 (TAT 6-24 HRS): SARS Coronavirus 2: NEGATIVE

## 2019-12-31 NOTE — Progress Notes (Signed)
Anesthesia Chart Review: Norma [name]   Case: [id number] Date/Time: 01/02/20 0815   Procedures:      TONSILLECTOMY (Bilateral )     NASAL SEPTOPLASTY WITH TURBINATE REDUCTION (Bilateral )   Anesthesia type: General   Pre-op diagnosis: deviated septum, tonsil hypertrophy   Location: [facility] OR ROOM 06 / [place] [facility]   Surgeons: Jerrell Belfast, MD      DISCUSSION: Patient is a 49 year old female scheduled for the above procedure. She has known OSA and is currently unable to use CPAP due to nasal septal deviation and tonsillar hypertrophy.   History includes never smoker, OSA, anxiety, claustrophobia, GERD, migraines, chronic back pain, cholecystectomy 08/13/16, obesity. Family history of hypertrophic cardiomyopathy.   She has a daughter with Trisomy 23 with PDA, ASD, VSD. Her mother had a history of hypertrophic cardiomyopathy and reportedly died at age 89, 3 weeks following surgery at the [facility]. She was evaluated by cardiologist Dr. Marlou Porch due to family history of hypertrophic cardiomyopathy. No hypertrophy changes on EKG then. Had chronic DOE. No murmur on exam. Given family history, he discussed echocardiogram and ETT--these were scheduled but then the COVID-19 pandemic hit. Reportedly, she never rescheduled testing due to wanting to limit risk of COVID-19 exposure given a child with Down Syndrome. In August, Dr. Marlou Porch noted she could contact their office when she is ready to reschedule.  Needs labs and urine pregnancy test on the day of surgery. Will also plan preoperative EKG given family history of hypertrophic cardiomyopathy. She had anesthesia (Propofol) for EGD and colonoscopy on 07/11/19. I have left a voice message for patient to call me, but in the interim reviewed available information with anesthesiologist Oddono, EJ, MD and if asymptomatic from a CV standpoint and acceptable results from day of surgery testing then anticipated she can proceed. Anesthesia team to evaluate on the day of  surgery. 12/31/19 pre-surgical COVID-19 test is in process.   According to 12/27/19 ENT notes (Care Everywhere), patient to stay for 24 hour observation after surgery.    VS:   Wt Readings from Last 3 Encounters:  09/13/19 116.2 kg  07/30/19 117 kg  07/11/19 112.5 kg   BP Readings from Last 3 Encounters:  09/13/19 112/78  07/30/19 130/86  07/11/19 136/90   Pulse Readings from Last 3 Encounters:  09/13/19 64  07/30/19 81  07/11/19 80    PROVIDERS: Midge Minium, MD is PCP Chesley Mires, MD is pulmonologist. Last evaluation 07/30/19.  Thornton Park, MD is GI. By notes, normal colonoscopy with negative biopsies for microscopic colitis 07/11/19. Extensive esophagitis with ulceration on EGD and will consider repeat EGD after sinus surgery.    LABS: For day of surgery. As of 03/09/19, Cr 0.81, glucose 80, CBC WNL.    IMAGES: CT paranasal sinuses 08/13/19 [facility]): IMPRESSION: No evidence of inflammatory sinus disease. No significant unfavorable anatomic variant.   EKG: Last EKG 11/07/18 showed NSR.    CV: N/A   Past Medical History:  Diagnosis Date  . Anxiety   . ASCUS (atypical squamous cells of undetermined significance) on Pap smear 07   NEG HPV; BENIGN CHANGES 4/08  . Chronic back pain   . Claustrophobia    with mask on  . Esophagitis   . Family history of adverse reaction to anesthesia    daughter Jinny Blossom low blood pressure during ear tube surgery 2 years ago  . Family history of hypertrophic cardiomyopathy    mother  . GERD (gastroesophageal reflux disease)   .  Headache    hx migraines once a year or so now  . Sleep apnea    C PAP  . 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 Urinary incontinence    Stress incontinence    Past Surgical History:  Procedure Laterality Date  . BILATERAL FUSION  68   age 24--Foot  . CHOLECYSTECTOMY N/A 08/13/2016   Procedure: LAPAROSCOPIC CHOLECYSTECTOMY WITH INTRAOPERATIVE CHOLANGIOGRAM;   Surgeon: Avel Peace, MD;  Location: [facility];  Service: General;  Laterality: N/A;  . GALLBLADDER SURGERY    . INTRAUTERINE DEVICE INSERTION  07/2015   Mirena  . INTRAUTERINE DEVICE INSERTION  05/19/2017   Mirena  . LAPAROSCOPIC ABDOMINAL EXPLORATION  08/13/2016   Procedure: LAPAROSCOPIC  SMALL BOWEL EXPLORATION;  Surgeon: Avel Peace, MD;  Location: [facility];  Service: General;;  . Toney Reil removed  spring 2017    MEDICATIONS: No current facility-administered medications for this encounter.   Marland Kitchen azelastine (OPTIVAR) 0.05 % ophthalmic solution  . buPROPion (WELLBUTRIN XL) 300 MG 24 hr tablet  . calcium carbonate (TUMS - DOSED IN MG ELEMENTAL CALCIUM) 500 MG chewable tablet  . CALCIUM PO  . cetirizine (ZYRTEC) 10 MG tablet  . colestipol (COLESTID) 1 g tablet  . fluticasone (FLONASE) 50 MCG/ACT nasal spray  . ibuprofen (ADVIL,MOTRIN) 200 MG tablet  . levonorgestrel (MIRENA) 20 MCG/24HR IUD  . Multiple Vitamin (MULTIVITAMIN) tablet  . omeprazole (PRILOSEC) 40 MG capsule  . triamcinolone ointment (KENALOG) 0.1 %  . venlafaxine XR (EFFEXOR-XR) 150 MG 24 hr capsule  . clonazePAM (KLONOPIN) 1 MG tablet    Shonna Chock, PA-C Surgical Short Stay/Anesthesiology [facility] Phone [phone] [facility] Phone [phone] 12/31/2019 4:44 PM

## 2020-01-02 ENCOUNTER — Other Ambulatory Visit: Payer: Self-pay

## 2020-01-02 ENCOUNTER — Ambulatory Visit (HOSPITAL_COMMUNITY): Payer: 59 | Admitting: Vascular Surgery

## 2020-01-02 ENCOUNTER — Observation Stay (HOSPITAL_COMMUNITY)
Admission: RE | Admit: 2020-01-02 | Discharge: 2020-01-03 | Disposition: A | Discharge status: Home / Self Care | Payer: 59 | Attending: Otolaryngology | Admitting: Otolaryngology

## 2020-01-02 ENCOUNTER — Encounter (HOSPITAL_COMMUNITY): Payer: Self-pay | Admitting: Otolaryngology

## 2020-01-02 ENCOUNTER — Encounter (HOSPITAL_COMMUNITY)
Admission: RE | Disposition: A | Discharge status: Home / Self Care | Payer: Self-pay | Source: Home / Self Care | Attending: Otolaryngology

## 2020-01-02 DIAGNOSIS — G4730 Sleep apnea, unspecified: Secondary | ICD-10-CM | POA: Diagnosis not present

## 2020-01-02 DIAGNOSIS — Z79899 Other long term (current) drug therapy: Secondary | ICD-10-CM | POA: Insufficient documentation

## 2020-01-02 DIAGNOSIS — Z6841 Body Mass Index (BMI) 40.0 and over, adult: Secondary | ICD-10-CM | POA: Diagnosis not present

## 2020-01-02 DIAGNOSIS — J351 Hypertrophy of tonsils: Secondary | ICD-10-CM | POA: Diagnosis present

## 2020-01-02 DIAGNOSIS — Z885 Allergy status to narcotic agent status: Secondary | ICD-10-CM | POA: Insufficient documentation

## 2020-01-02 DIAGNOSIS — J342 Deviated nasal septum: Secondary | ICD-10-CM | POA: Diagnosis not present

## 2020-01-02 DIAGNOSIS — J343 Hypertrophy of nasal turbinates: Secondary | ICD-10-CM | POA: Diagnosis not present

## 2020-01-02 DIAGNOSIS — F329 Major depressive disorder, single episode, unspecified: Secondary | ICD-10-CM | POA: Insufficient documentation

## 2020-01-02 DIAGNOSIS — K219 Gastro-esophageal reflux disease without esophagitis: Secondary | ICD-10-CM | POA: Diagnosis not present

## 2020-01-02 DIAGNOSIS — F419 Anxiety disorder, unspecified: Secondary | ICD-10-CM | POA: Diagnosis not present

## 2020-01-02 HISTORY — PX: TONSILLECTOMY: SHX5217

## 2020-01-02 HISTORY — PX: TONSILLECTOMY: SUR1361

## 2020-01-02 HISTORY — PX: NASAL SEPTOPLASTY W/ TURBINOPLASTY: SHX2070

## 2020-01-02 HISTORY — DX: Esophagitis, unspecified without bleeding: K20.90

## 2020-01-02 HISTORY — DX: Family history of ischemic heart disease and other diseases of the circulatory system: Z82.49

## 2020-01-02 LAB — CBC
HCT: 39.7 % (ref 36.0–46.0)
Hemoglobin: 12.8 g/dL (ref 12.0–15.0)
MCH: 28.1 pg (ref 26.0–34.0)
MCHC: 32.2 g/dL (ref 30.0–36.0)
MCV: 87.3 fL (ref 80.0–100.0)
Platelets: 192 10*3/uL (ref 150–400)
RBC: 4.55 MIL/uL (ref 3.87–5.11)
RDW: 13.2 % (ref 11.5–15.5)
WBC: 6.9 10*3/uL (ref 4.0–10.5)
nRBC: 0 % (ref 0.0–0.2)

## 2020-01-02 LAB — POCT PREGNANCY, URINE: Preg Test, Ur: NEGATIVE

## 2020-01-02 SURGERY — TONSILLECTOMY
Anesthesia: General | Site: Nose | Laterality: Bilateral

## 2020-01-02 MED ORDER — CLONAZEPAM 1 MG PO TABS
1.0000 mg | ORAL_TABLET | Freq: Every evening | ORAL | Status: DC | PRN
  Administered 2020-01-02: 1 mg via ORAL
  Filled 2020-01-02: qty 1

## 2020-01-02 MED ORDER — DEXTROSE IN LACTATED RINGERS 5 % IV SOLN: INTRAVENOUS | Status: DC

## 2020-01-02 MED ORDER — DEXAMETHASONE SODIUM PHOSPHATE 10 MG/ML IJ SOLN: 10.0000 mg | Freq: Three times a day (TID) | INTRAMUSCULAR | Status: DC

## 2020-01-02 MED ORDER — DIPHENHYDRAMINE HCL 50 MG/ML IJ SOLN
6.2500 mg | Freq: Once | INTRAMUSCULAR | Status: AC
  Administered 2020-01-02: 6.5 mg via INTRAVENOUS

## 2020-01-02 MED ORDER — DEXAMETHASONE SODIUM PHOSPHATE 10 MG/ML IJ SOLN
10.0000 mg | Freq: Once | INTRAMUSCULAR | Status: AC
  Administered 2020-01-03: 10 mg via INTRAVENOUS
  Filled 2020-01-02: qty 1

## 2020-01-02 MED ORDER — OXYCODONE HCL 5 MG PO TABS: 5.0000 mg | ORAL_TABLET | Freq: Once | ORAL | Status: DC | PRN

## 2020-01-02 MED ORDER — MORPHINE SULFATE (PF) 2 MG/ML IV SOLN
2.0000 mg | INTRAVENOUS | Status: DC | PRN
  Administered 2020-01-02: 4 mg via INTRAVENOUS
  Administered 2020-01-03: 2 mg via INTRAVENOUS
  Filled 2020-01-02: qty 1
  Filled 2020-01-02: qty 2

## 2020-01-02 MED ORDER — MUPIROCIN 2 % EX OINT
TOPICAL_OINTMENT | CUTANEOUS | Status: AC
  Filled 2020-01-02: qty 22

## 2020-01-02 MED ORDER — FENTANYL CITRATE (PF) 100 MCG/2ML IJ SOLN
INTRAMUSCULAR | Status: AC
  Filled 2020-01-02: qty 2

## 2020-01-02 MED ORDER — MUPIROCIN CALCIUM 2 % EX CREA
TOPICAL_CREAM | CUTANEOUS | Status: AC
  Filled 2020-01-02: qty 15

## 2020-01-02 MED ORDER — MIDAZOLAM HCL 2 MG/2ML IJ SOLN
INTRAMUSCULAR | Status: AC
  Filled 2020-01-02: qty 2

## 2020-01-02 MED ORDER — CEFAZOLIN SODIUM-DEXTROSE 2-4 GM/100ML-% IV SOLN
INTRAVENOUS | Status: AC
  Filled 2020-01-02: qty 100

## 2020-01-02 MED ORDER — FENTANYL CITRATE (PF) 250 MCG/5ML IJ SOLN
INTRAMUSCULAR | Status: AC
  Filled 2020-01-02: qty 5

## 2020-01-02 MED ORDER — COLESTIPOL HCL 1 G PO TABS
1.0000 g | ORAL_TABLET | Freq: Two times a day (BID) | ORAL | Status: DC
  Administered 2020-01-02: 1 g via ORAL
  Filled 2020-01-02 (×2): qty 1

## 2020-01-02 MED ORDER — LACTATED RINGERS IV SOLN: INTRAVENOUS | Status: DC | PRN

## 2020-01-02 MED ORDER — LIDOCAINE 2% (20 MG/ML) 5 ML SYRINGE
INTRAMUSCULAR | Status: AC
  Filled 2020-01-02: qty 5

## 2020-01-02 MED ORDER — HYDROCODONE-ACETAMINOPHEN 7.5-325 MG/15ML PO SOLN: 10.0000 mL | ORAL | 0 refills | Status: DC | PRN

## 2020-01-02 MED ORDER — FENTANYL CITRATE (PF) 250 MCG/5ML IJ SOLN
INTRAMUSCULAR | Status: DC | PRN
  Administered 2020-01-02 (×2): 50 ug via INTRAVENOUS
  Administered 2020-01-02: 100 ug via INTRAVENOUS
  Administered 2020-01-02: 50 ug via INTRAVENOUS

## 2020-01-02 MED ORDER — DEXAMETHASONE SODIUM PHOSPHATE 10 MG/ML IJ SOLN
10.0000 mg | Freq: Once | INTRAMUSCULAR | Status: AC
  Administered 2020-01-02: 10 mg via INTRAVENOUS
  Filled 2020-01-02 (×2): qty 1

## 2020-01-02 MED ORDER — SUCCINYLCHOLINE CHLORIDE 200 MG/10ML IV SOSY
PREFILLED_SYRINGE | INTRAVENOUS | Status: AC
  Filled 2020-01-02: qty 10

## 2020-01-02 MED ORDER — LIDOCAINE 2% (20 MG/ML) 5 ML SYRINGE
INTRAMUSCULAR | Status: DC | PRN
  Administered 2020-01-02: 50 mg via INTRAVENOUS

## 2020-01-02 MED ORDER — SUGAMMADEX SODIUM 200 MG/2ML IV SOLN
INTRAVENOUS | Status: DC | PRN
  Administered 2020-01-02: 200 mg via INTRAVENOUS

## 2020-01-02 MED ORDER — PROPOFOL 10 MG/ML IV BOLUS
INTRAVENOUS | Status: DC | PRN
  Administered 2020-01-02: 150 mg via INTRAVENOUS

## 2020-01-02 MED ORDER — ONDANSETRON HCL 4 MG PO TABS: 4.0000 mg | ORAL_TABLET | ORAL | Status: DC | PRN

## 2020-01-02 MED ORDER — IBUPROFEN 100 MG/5ML PO SUSP
400.0000 mg | Freq: Four times a day (QID) | ORAL | Status: DC | PRN
  Administered 2020-01-02: 400 mg via ORAL
  Filled 2020-01-02: qty 20

## 2020-01-02 MED ORDER — OXYMETAZOLINE HCL 0.05 % NA SOLN
NASAL | Status: AC
  Filled 2020-01-02: qty 30

## 2020-01-02 MED ORDER — ONDANSETRON HCL 4 MG/2ML IJ SOLN
INTRAMUSCULAR | Status: DC | PRN
  Administered 2020-01-02: 4 mg via INTRAVENOUS

## 2020-01-02 MED ORDER — DEXAMETHASONE SODIUM PHOSPHATE 10 MG/ML IJ SOLN
INTRAMUSCULAR | Status: DC | PRN
  Administered 2020-01-02: 10 mg via INTRAVENOUS

## 2020-01-02 MED ORDER — MORPHINE SULFATE (PF) 2 MG/ML IV SOLN
2.0000 mg | INTRAVENOUS | Status: DC | PRN
Start: 2020-01-02 — End: 2020-01-02

## 2020-01-02 MED ORDER — HYDROCODONE-ACETAMINOPHEN 7.5-325 MG/15ML PO SOLN
10.0000 mL | Freq: Four times a day (QID) | ORAL | Status: DC | PRN
  Administered 2020-01-02 – 2020-01-03 (×3): 15 mL via ORAL
  Filled 2020-01-02 (×3): qty 15

## 2020-01-02 MED ORDER — 0.9 % SODIUM CHLORIDE (POUR BTL) OPTIME
TOPICAL | Status: DC | PRN
  Administered 2020-01-02: 1000 mL

## 2020-01-02 MED ORDER — BUPROPION HCL ER (XL) 150 MG PO TB24: 300.0000 mg | ORAL_TABLET | Freq: Every day | ORAL | Status: DC

## 2020-01-02 MED ORDER — ONDANSETRON HCL 4 MG/2ML IJ SOLN: 4.0000 mg | INTRAMUSCULAR | Status: DC | PRN

## 2020-01-02 MED ORDER — MUPIROCIN CALCIUM 2 % EX CREA
TOPICAL_CREAM | CUTANEOUS | Status: DC | PRN
  Administered 2020-01-02: 1 via TOPICAL

## 2020-01-02 MED ORDER — FENTANYL CITRATE (PF) 100 MCG/2ML IJ SOLN
25.0000 ug | INTRAMUSCULAR | Status: DC | PRN
  Administered 2020-01-02 (×3): 25 ug via INTRAVENOUS
  Administered 2020-01-02: 50 ug via INTRAVENOUS

## 2020-01-02 MED ORDER — DIPHENHYDRAMINE HCL 50 MG/ML IJ SOLN
INTRAMUSCULAR | Status: AC
  Filled 2020-01-02: qty 1

## 2020-01-02 MED ORDER — VENLAFAXINE HCL ER 75 MG PO CP24: 150.0000 mg | ORAL_CAPSULE | Freq: Every day | ORAL | Status: DC

## 2020-01-02 MED ORDER — PHENOL 1.4 % MT LIQD: 1.0000 | OROMUCOSAL | Status: DC | PRN

## 2020-01-02 MED ORDER — ONDANSETRON HCL 4 MG/2ML IJ SOLN
4.0000 mg | Freq: Four times a day (QID) | INTRAMUSCULAR | Status: AC | PRN
  Administered 2020-01-02: 4 mg via INTRAVENOUS

## 2020-01-02 MED ORDER — HYDROCODONE-ACETAMINOPHEN 7.5-325 MG/15ML PO SOLN: 10.0000 mL | ORAL | Status: DC | PRN

## 2020-01-02 MED ORDER — DEXAMETHASONE SODIUM PHOSPHATE 10 MG/ML IJ SOLN
INTRAMUSCULAR | Status: AC
  Filled 2020-01-02: qty 1

## 2020-01-02 MED ORDER — OXYCODONE HCL 5 MG/5ML PO SOLN: 5.0000 mg | Freq: Once | ORAL | Status: DC | PRN

## 2020-01-02 MED ORDER — MIDAZOLAM HCL 2 MG/2ML IJ SOLN
INTRAMUSCULAR | Status: DC | PRN
  Administered 2020-01-02: 2 mg via INTRAVENOUS

## 2020-01-02 MED ORDER — ROCURONIUM BROMIDE 10 MG/ML (PF) SYRINGE
PREFILLED_SYRINGE | INTRAVENOUS | Status: DC | PRN
  Administered 2020-01-02: 50 mg via INTRAVENOUS

## 2020-01-02 MED ORDER — HYDROCODONE-ACETAMINOPHEN 7.5-325 MG/15ML PO SOLN: 10.0000 mL | Freq: Four times a day (QID) | ORAL | 0 refills | Status: AC | PRN

## 2020-01-02 MED ORDER — ONDANSETRON HCL 4 MG/2ML IJ SOLN
INTRAMUSCULAR | Status: AC
  Filled 2020-01-02: qty 2

## 2020-01-02 MED ORDER — PROMETHAZINE HCL 25 MG/ML IJ SOLN: 25.0000 mg | Freq: Four times a day (QID) | INTRAMUSCULAR | Status: DC | PRN

## 2020-01-02 MED ORDER — LIDOCAINE-EPINEPHRINE 1 %-1:100000 IJ SOLN
INTRAMUSCULAR | Status: DC | PRN
  Administered 2020-01-02: 6 mL

## 2020-01-02 MED ORDER — CEFAZOLIN SODIUM-DEXTROSE 2-3 GM-%(50ML) IV SOLR
INTRAVENOUS | Status: DC | PRN
  Administered 2020-01-02: 2 g via INTRAVENOUS

## 2020-01-02 MED ORDER — PANTOPRAZOLE SODIUM 40 MG PO TBEC: 80.0000 mg | DELAYED_RELEASE_TABLET | Freq: Every day | ORAL | Status: DC

## 2020-01-02 MED ORDER — IBUPROFEN 100 MG/5ML PO SUSP
400.0000 mg | Freq: Four times a day (QID) | ORAL | Status: DC | PRN
Start: 2020-01-02 — End: 2020-01-02

## 2020-01-02 MED ORDER — LIDOCAINE-EPINEPHRINE 1 %-1:100000 IJ SOLN
INTRAMUSCULAR | Status: AC
  Filled 2020-01-02: qty 1

## 2020-01-02 MED ORDER — PROPOFOL 10 MG/ML IV BOLUS
INTRAVENOUS | Status: AC
  Filled 2020-01-02: qty 40

## 2020-01-02 MED ORDER — AMOXICILLIN-POT CLAVULANATE 250-62.5 MG/5ML PO SUSR: 10.0000 mL | Freq: Two times a day (BID) | ORAL | 0 refills | Status: AC

## 2020-01-02 MED ORDER — OXYMETAZOLINE HCL 0.05 % NA SOLN
NASAL | Status: DC | PRN
  Administered 2020-01-02: 1 via TOPICAL

## 2020-01-02 MED ORDER — CALCIUM CARBONATE ANTACID 500 MG PO CHEW: 2.0000 | CHEWABLE_TABLET | ORAL | Status: DC | PRN

## 2020-01-02 SURGICAL SUPPLY — 40 items
BLADE SURG 15 STRL LF DISP TIS (BLADE) ×2 IMPLANT
BLADE SURG 15 STRL SS (BLADE) ×3
CANISTER SUCT 3000ML PPV (MISCELLANEOUS) ×3 IMPLANT
CATH ROBINSON RED A/P 12FR (CATHETERS) ×1 IMPLANT
CLEANER TIP ELECTROSURG 2X2 (MISCELLANEOUS) ×3 IMPLANT
COAGULATOR SUCT 8FR VV (MISCELLANEOUS) IMPLANT
COAGULATOR SUCT SWTCH 10FR 6 (ELECTROSURGICAL) ×4 IMPLANT
COVER WAND RF STERILE (DRAPES) ×3 IMPLANT
DRAPE HALF SHEET 40X57 (DRAPES) IMPLANT
ELECT COATED BLADE 2.86 ST (ELECTRODE) ×3 IMPLANT
ELECT REM PT RETURN 9FT ADLT (ELECTROSURGICAL)
ELECT REM PT RETURN 9FT PED (ELECTROSURGICAL)
ELECTRODE REM PT RETRN 9FT PED (ELECTROSURGICAL) IMPLANT
ELECTRODE REM PT RTRN 9FT ADLT (ELECTROSURGICAL) IMPLANT
GAUZE 4X4 16PLY RFD (DISPOSABLE) ×3 IMPLANT
GAUZE SPONGE 2X2 8PLY STRL LF (GAUZE/BANDAGES/DRESSINGS) ×2 IMPLANT
GLOVE BIOGEL M 7.0 STRL (GLOVE) ×6 IMPLANT
GOWN STRL REUS W/ TWL LRG LVL3 (GOWN DISPOSABLE) ×4 IMPLANT
GOWN STRL REUS W/TWL LRG LVL3 (GOWN DISPOSABLE) ×6
KIT BASIN OR (CUSTOM PROCEDURE TRAY) ×3 IMPLANT
KIT TURNOVER KIT B (KITS) ×3 IMPLANT
NDL HYPO 25GX1X1/2 BEV (NEEDLE) ×2 IMPLANT
NEEDLE HYPO 25GX1X1/2 BEV (NEEDLE) ×3 IMPLANT
NS IRRIG 1000ML POUR BTL (IV SOLUTION) ×3 IMPLANT
PACK SURGICAL SETUP 50X90 (CUSTOM PROCEDURE TRAY) ×3 IMPLANT
PAD ARMBOARD 7.5X6 YLW CONV (MISCELLANEOUS) ×6 IMPLANT
PENCIL BUTTON HOLSTER BLD 10FT (ELECTRODE) ×3 IMPLANT
SPECIMEN JAR SMALL (MISCELLANEOUS) ×6 IMPLANT
SPLINT NASAL DOYLE BI-VL (GAUZE/BANDAGES/DRESSINGS) ×3 IMPLANT
SPONGE GAUZE 2X2 STER 10/PKG (GAUZE/BANDAGES/DRESSINGS) ×1
SPONGE NEURO XRAY DETECT 1X3 (DISPOSABLE) ×3 IMPLANT
SPONGE TONSIL TAPE 1 RFD (DISPOSABLE) ×3 IMPLANT
SUT ETHILON 3 0 PS 1 (SUTURE) ×3 IMPLANT
SUT PLAIN 4 0 ~~LOC~~ 1 (SUTURE) ×3 IMPLANT
SYR BULB EAR ULCER 3OZ GRN STR (SYRINGE) ×3 IMPLANT
TOWEL GREEN STERILE FF (TOWEL DISPOSABLE) ×6 IMPLANT
TRAY ENT MC OR (CUSTOM PROCEDURE TRAY) ×3 IMPLANT
TUBE SALEM SUMP 16 FR W/ARV (TUBING) ×3 IMPLANT
TUBING EXTENTION W/L.L. (IV SETS) ×3 IMPLANT
WATER STERILE IRR 1000ML POUR (IV SOLUTION) ×3 IMPLANT

## 2020-01-02 NOTE — Progress Notes (Signed)
   ENT Progress Note:  s/p Procedure(s): TONSILLECTOMY NASAL SEPTOPLASTY WITH TURBINATE REDUCTION   Subjective: Postoperative nausea and vomiting.  Moderate discomfort.  Objective: Vital signs in last 24 hours: Temp:  [97 F (36.1 C)-98.4 F (36.9 C)] 97.7 F (36.5 C) (05/05 1506) Pulse Rate:  [79-95] 80 (05/05 1506) Resp:  [8-25] 19 (05/05 1506) BP: (124-167)/(81-109) 154/92 (05/05 1506) SpO2:  [79 %-100 %] 95 % (05/05 1506) Weight:  [120.2 kg] 120.2 kg (05/05 0721) Weight change:     Intake/Output from previous day: No intake/output data recorded. Intake/Output this shift: Total I/O In: 1200 [I.V.:950; Other:200; IV Piggyback:50] Out: 25 [Blood:25]  Labs: Recent Labs    01/02/20 0723  WBC 6.9  HGB 12.8  HCT 39.7  PLT 192   No results for input(s): NA, K, CL, CO2, GLUCOSE, BUN, CALCIUM in the last 72 hours.  Invalid input(s): CREATININR  Studies/Results: No results found.   PHYSICAL EXAM: Minimal bloody nasal discharge, airway stable.  No active bleeding.   Assessment/Plan: Patient with postoperative nausea and vomiting and moderate levels of discomfort.  Recommend continued IV hydration and overnight observation.  Monitor oral intake and plan discharge tomorrow.  The patient has hives with narcotic pain medications, unfortunately she will need to continue to take the medication to control postoperative pain.    Osborn Coho 01/02/2020, 4:46 PM

## 2020-01-02 NOTE — Transfer of Care (Signed)
Immediate Anesthesia Transfer of Care Note  Patient: Norma [name]  Procedure(s) Performed: TONSILLECTOMY (Bilateral Mouth) NASAL SEPTOPLASTY WITH TURBINATE REDUCTION (Bilateral Nose)  Patient Location: PACU  Anesthesia Type:General  Level of Consciousness: awake, alert  and oriented  Airway & Oxygen Therapy: Patient Spontanous Breathing and Patient connected to face mask oxygen  Post-op Assessment: Report given to RN and Post -op Vital signs reviewed and stable  Post vital signs: Reviewed and stable  Last Vitals:  Vitals Value Taken Time  BP 155/81 01/02/20 1022  Temp    Pulse 93 01/02/20 1029  Resp 22 01/02/20 1029  SpO2 97 % 01/02/20 1029  Vitals shown include unvalidated device data.  Last Pain:  Vitals:   01/02/20 0732  TempSrc:   PainSc: 5       Patients Stated Pain Goal: 2 (01/02/20 0732)  Complications: No apparent anesthesia complications

## 2020-01-02 NOTE — Op Note (Signed)
Operative Note: SEPTOPLASTY AND INFERIOR TURBINATE REDUCTION    TONSILLECTOMY  Patient: Norma [name]  Medical record number: [id number]  Date:01/02/2020  Pre-operative Indications: 1. Deviated nasal septum with nasal airway obstruction     2. Bilateral inferior turbinate hypertrophy     3.  Tonsillar hypertrophy  Postoperative Indications: Same  Surgical Procedure: 1.  Nasal Septoplasty    2.  Bilateral Inferior Turbinate Reduction    3.  Tonsillectomy   Anesthesia: GET  Surgeon: Delsa Bern, M.D.  Complications: None  EBL: 50 cc  Findings: Severely deviated nasal septum with airway obstruction and bilateral inferior turbinate hypertrophy.   Brief History: The patient is a 49 y.o. female with a history of progressive nasal airway obstruction. The patient has been on medical therapy to reduce nasal mucosal edema including saline nasal spray and topical nasal steroids. Despite appropriate medical therapy the patient continues to have ongoing symptoms. Given the patient's history and findings, the above surgical procedures were recommended, risks and benefits were discussed in detail with the patient may understand and agree with our plan for surgery which is scheduled at [facility] under general anesthesia as an outpatient.  Surgical Procedure: The patient is brought to the operating room on 01/02/2020 and placed in supine position on the operating table. General endotracheal anesthesia was established without difficulty. When the patient was adequately anesthetized, surgical timeout was performed and correct identification of the patient and the surgical procedure. The patient's nose was then injected with 6 cc of 1% lidocaine 1 100,000 dilution epinephrine which was injected in a submucosal fashion. The patient's nose was then packed with Afrin-soaked cottonoid pledgets were left in place for approximately 10 minutes lateral vasoconstriction and hemostasis.  .  With the patient prepped draped and prepared for surgery, nasal septoplasty was begun.  A left anterior hemitransfixion incision was created and a mucoperichondrial flap was elevated from anterior to posterior on the left-hand side. The anterior cartilaginous septum was crossed at the midline and a mucoperichondrial flap was elevated on the patient's right.  Swivel knife was then used to resect the anterior and mid cartilaginous portion of the nasal septum.  Resected cartilage was morcellized and returned to the mucoperichondrial pocket at the occlusion of the surgical procedure.  Dissection was then carried out from anterior to posterior removing deviated bone and cartilage including a large septal spur the overlying mucosa was preserved.  With the septum brought to good midline position, the morselized cartilage was returned to the mucoperichondrial pocket and the soft tissue/mucosal flaps were reapproximated with interrupted 4-0 gut suture on a Keith needle in a horizontal mattressing fashion.  Anterior hemitransfixion incision was closed with the same stitch.  Bilateral Doyle nasal septal splints were then placed after the application of Bactroban ointment and sutured in position with a 3-0 Ethilon suture.  Attention was then turned to the inferior turbinates, bilateral inferior turbinate intramural cautery was performed with cautery setting at 22 W.  2 submucosal passes were made in each inferior turbinate.  After completing cautery, anterior vertical incisions were created and overlying soft tissue was elevated, a small amount of turbinate bone was resected.  The turbinates were then outfractured to create a more patent nasal passageway.  With the patient prepared for surgery a Phill Mutter mouth gag was inserted without difficulty, there were no loose or broken teeth and the hard soft palate were intact. Tonsillectomy was then performed, using Bovie cautery and dissecting in a subcapsular fashion the  entire left tonsil was removed from superior pole to tongue base. Right tonsil removed in a similar fashion.  Tonsil tissue was sent to pathology for gross and microscopic evaluation.  The tonsillar fossa were gently abraded with dry tonsil sponge and several small areas of point hemorrhage were cauterized with suction cautery. The Crowe-Davis mouth gag was released and reapplied there is no active bleeding. Oral cavity and nasopharynx were irrigated with saline. An orogastric tube was passed and stomach contents were aspirated. Mouthgag was removed, again no loose or broken teeth and no bleeding. Patient was awakened from anesthetic and extubated, then transferred from the operating room to the recovery room in stable condition. There were no complications and blood loss was minimal.  Surgical sponge count was correct. An oral gastric tube was passed and the stomach contents were aspirated. Patient was awakened from anesthetic and transferred from the operating room to the recovery room in stable condition. There were no complications and blood loss was 50cc.   Barbee Cough, M.D. [facility] 01/02/2020

## 2020-01-02 NOTE — Anesthesia Preprocedure Evaluation (Signed)
Anesthesia Evaluation  Patient identified by MRN, date of birth, ID band Patient awake    Reviewed: Allergy & Precautions, H&P , NPO status , Patient's Chart, lab work & pertinent test results  Airway Mallampati: II   Neck ROM: full    Dental   Pulmonary sleep apnea ,    breath sounds clear to auscultation       Cardiovascular negative cardio ROS   Rhythm:regular Rate:Normal     Neuro/Psych  Headaches, PSYCHIATRIC DISORDERS Anxiety Depression    GI/Hepatic GERD  ,  Endo/Other  Morbid obesity  Renal/GU      Musculoskeletal   Abdominal   Peds  Hematology   Anesthesia Other Findings   Reproductive/Obstetrics                             Anesthesia Physical Anesthesia Plan  ASA: II  Anesthesia Plan: General   Post-op Pain Management:    Induction: Intravenous  PONV Risk Score and Plan: 3 and Ondansetron, Dexamethasone, Midazolam and Treatment may vary due to age or medical condition  Airway Management Planned: Oral ETT  Additional Equipment:   Intra-op Plan:   Post-operative Plan: Extubation in OR  Informed Consent: I have reviewed the patients History and Physical, chart, labs and discussed the procedure including the risks, benefits and alternatives for the proposed anesthesia with the patient or authorized representative who has indicated his/her understanding and acceptance.       Plan Discussed with: CRNA, Anesthesiologist and Surgeon  Anesthesia Plan Comments:         Anesthesia Quick Evaluation

## 2020-01-02 NOTE — Anesthesia Procedure Notes (Signed)
Procedure Name: Intubation Date/Time: 01/02/2020 9:09 AM Performed by: Mayer Camel, CRNA Pre-anesthesia Checklist: Patient identified, Emergency Drugs available, Suction available and Patient being monitored Patient Re-evaluated:Patient Re-evaluated prior to induction Oxygen Delivery Method: Circle System Utilized Preoxygenation: Pre-oxygenation with 100% oxygen Induction Type: IV induction Ventilation: Mask ventilation without difficulty Laryngoscope Size: Miller and 2 Grade View: Grade I Tube type: Oral Tube size: 7.0 mm Number of attempts: 1 Airway Equipment and Method: Stylet Placement Confirmation: ETT inserted through vocal cords under direct vision,  positive ETCO2 and breath sounds checked- equal and bilateral Secured at: 22 cm Tube secured with: Tape Dental Injury: Teeth and Oropharynx as per pre-operative assessment

## 2020-01-02 NOTE — H&P (Signed)
Norma [name] is an 49 y.o. female.   Chief Complaint: Airway obstruction HPI: Hx of Septal Deviation & Tonsillar Hypertrophy  Past Medical History:  Diagnosis Date  . Anxiety   . ASCUS (atypical squamous cells of undetermined significance) on Pap smear 07   NEG HPV; BENIGN CHANGES 4/08  . Chronic back pain   . Claustrophobia    with mask on  . Esophagitis   . Family history of adverse reaction to anesthesia    daughter Jinny Blossom low blood pressure during ear tube surgery 2 years ago  . Family history of hypertrophic cardiomyopathy    mother  . GERD (gastroesophageal reflux disease)   . Headache    hx migraines once a year or so now  . Sleep apnea    C PAP  . 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 Urinary incontinence    Stress incontinence    Past Surgical History:  Procedure Laterality Date  . BILATERAL FUSION  26   age 78--Foot  . CHOLECYSTECTOMY N/A 08/13/2016   Procedure: LAPAROSCOPIC CHOLECYSTECTOMY WITH INTRAOPERATIVE CHOLANGIOGRAM;  Surgeon: Jackolyn Confer, MD;  Location: [facility];  Service: General;  Laterality: N/A;  . GALLBLADDER SURGERY    . INTRAUTERINE DEVICE INSERTION  07/2015   Mirena  . INTRAUTERINE DEVICE INSERTION  05/19/2017   Mirena  . LAPAROSCOPIC ABDOMINAL EXPLORATION  08/13/2016   Procedure: LAPAROSCOPIC  SMALL BOWEL EXPLORATION;  Surgeon: Jackolyn Confer, MD;  Location: [facility];  Service: General;;  . Verdis Frederickson removed  spring 2017    Family History  Problem Relation Age of Onset  . Hypertension Maternal Grandfather   . Heart disease Maternal Grandfather   . Hypertension Paternal Grandmother   . Heart disease Paternal Grandmother   . Hypertension Paternal Grandfather   . Heart disease Paternal Grandfather   . Depression Father   . Heart failure Maternal Grandmother   . Cardiomyopathy Mother    Social History:  reports that she has never smoked. She has never used smokeless tobacco. She reports current alcohol use.  She reports that she does not use drugs.  Allergies:  Allergies  Allergen Reactions  . Codeine Hives  . Depakote [Divalproex Sodium] Other (See Comments)    blisters    Medications Prior to Admission  Medication Sig Dispense Refill  . buPROPion (WELLBUTRIN XL) 300 MG 24 hr tablet TAKE 1 TABLET BY MOUTH EVERY DAY (Patient taking differently: Take 300 mg by mouth daily. ) 90 tablet 2  . cetirizine (ZYRTEC) 10 MG tablet Take 10 mg by mouth daily.    . clonazePAM (KLONOPIN) 1 MG tablet Take 1 tablet (1 mg total) by mouth at bedtime as needed for anxiety. 30 tablet 3  . colestipol (COLESTID) 1 g tablet TAKE 2 TABLETS (2 G TOTAL) BY MOUTH 2 (TWO) TIMES DAILY. (Patient taking differently: Take 1 g by mouth 2 (two) times daily. ) 360 tablet 5  . fluticasone (FLONASE) 50 MCG/ACT nasal spray Place 2 sprays into both nostrils daily.    Marland Kitchen ibuprofen (ADVIL,MOTRIN) 200 MG tablet Take 800 mg by mouth every 6 (six) hours as needed for moderate pain.    Marland Kitchen levonorgestrel (MIRENA) 20 MCG/24HR IUD 1 each by Intrauterine route once.    . Multiple Vitamin (MULTIVITAMIN) tablet Take 1 tablet by mouth daily. Gummie    . omeprazole (PRILOSEC) 40 MG capsule TAKE 1 CAPSULE BY MOUTH TWICE A DAY BEST TO TAKE ON EMPTY STOMACH 20-30 MINUTES BEFORE FOOD (Patient  taking differently: Take 40 mg by mouth 2 (two) times daily. ) 60 capsule 1  . triamcinolone ointment (KENALOG) 0.1 % APPLY TO AFFECTED AREA TWICE A DAY (Patient taking differently: Apply 1 application topically daily as needed (rash on hands). ) 90 g 1  . venlafaxine XR (EFFEXOR-XR) 150 MG 24 hr capsule TAKE ONE CAPSULE EVERY DAY WITH BREAKFAST. (Patient taking differently: Take 150 mg by mouth daily. ) 90 capsule 0  . azelastine (OPTIVAR) 0.05 % ophthalmic solution INSTILL 1 DROP INTO RIGHT EYE TWICE A DAY (Patient taking differently: Place 1 drop into both eyes daily as needed (allergies). ) 6 mL 1  . calcium carbonate (TUMS - DOSED IN MG ELEMENTAL CALCIUM) 500  MG chewable tablet Chew 2 tablets by mouth as needed for indigestion or heartburn.    Marland Kitchen CALCIUM PO Take 1 tablet by mouth daily. When remembers      Results for orders placed or performed during the hospital encounter of 01/02/20 (from the past 48 hour(s))  Pregnancy, urine POC     Status: None   Collection Time: 01/02/20  7:22 AM  Result Value Ref Range   Preg Test, Ur NEGATIVE NEGATIVE    Comment:        THE SENSITIVITY OF THIS METHODOLOGY IS >24 mIU/mL   CBC     Status: None   Collection Time: 01/02/20  7:23 AM  Result Value Ref Range   WBC 6.9 4.0 - 10.5 K/uL   RBC 4.55 3.87 - 5.11 MIL/uL   Hemoglobin 12.8 12.0 - 15.0 g/dL   HCT 74.2 55.2 - 58.9 %   MCV 87.3 80.0 - 100.0 fL   MCH 28.1 26.0 - 34.0 pg   MCHC 32.2 30.0 - 36.0 g/dL   RDW 48.3 47.5 - 83.0 %   Platelets 192 150 - 400 K/uL   nRBC 0.0 0.0 - 0.2 %    Comment: Performed at [facility], [address], [place], [place] [address]   No results found.  Review of Systems  Constitutional: Negative.   HENT: Positive for congestion.   Respiratory: Positive for apnea.     Blood pressure 124/84, pulse 85, temperature 98.4 F (36.9 C), temperature source Oral, resp. rate 18, height 5\' 7"  (1.702 m), weight 120.2 kg, SpO2 98 %. Physical Exam  Constitutional: She appears well-developed and well-nourished.  HENT:  Septal Deviation Tonsillar Hypertrophy  Musculoskeletal:     Cervical back: Normal range of motion and neck supple.     Assessment/Plan Adm for OP airway surgery - Tonsillectomy, Septoplasty and IT reduction  , MD 01/02/2020, 9:03 AM

## 2020-01-03 ENCOUNTER — Encounter: Payer: Self-pay | Admitting: *Deleted

## 2020-01-03 DIAGNOSIS — J342 Deviated nasal septum: Secondary | ICD-10-CM | POA: Diagnosis not present

## 2020-01-03 LAB — SURGICAL PATHOLOGY

## 2020-01-03 NOTE — Discharge Summary (Signed)
Physician Discharge Summary  Patient ID: Norma [name] MRN: [id number] DOB/AGE: September 28, 1970 49 y.o.  Admit date: 01/02/2020 Discharge date: 01/03/2020  Admission Diagnoses:  Principal Problem:   Tonsillar hypertrophy Active Problems:   Nasal septal deviation   Tonsillar enlargement   Discharge Diagnoses:  Same  Surgeries: Procedure(s): TONSILLECTOMY NASAL SEPTOPLASTY WITH TURBINATE REDUCTION on 01/02/2020   Consultants: None  Discharged Condition: Improved  Hospital Course: Norma [name] is an 49 y.o. female who was admitted 01/02/2020 with a diagnosis of Principal Problem:   Tonsillar hypertrophy Active Problems:   Nasal septal deviation   Tonsillar enlargement  and went to the operating room on 01/02/2020 and underwent the above named procedures.   Patient stable on POD #1. D/C to home.  Recent vital signs:  Vitals:   01/03/20 0514 01/03/20 0800  BP: 107/74 127/77  Pulse: 75 64  Resp: 17 18  Temp: 97.7 F (36.5 C) 97.6 F (36.4 C)  SpO2: 97% 96%    Recent laboratory studies:  Results for orders placed or performed during the hospital encounter of 01/02/20  CBC  Result Value Ref Range   WBC 6.9 4.0 - 10.5 K/uL   RBC 4.55 3.87 - 5.11 MIL/uL   Hemoglobin 12.8 12.0 - 15.0 g/dL   HCT 39.7 36.0 - 46.0 %   MCV 87.3 80.0 - 100.0 fL   MCH 28.1 26.0 - 34.0 pg   MCHC 32.2 30.0 - 36.0 g/dL   RDW 13.2 11.5 - 15.5 %   Platelets 192 150 - 400 K/uL   nRBC 0.0 0.0 - 0.2 %  Pregnancy, urine POC  Result Value Ref Range   Preg Test, Ur NEGATIVE NEGATIVE    Discharge Medications:   Allergies as of 01/03/2020      Reactions   Codeine Hives   Depakote [divalproex Sodium] Other (See Comments)   blisters      Medication List    STOP taking these medications   fluticasone 50 MCG/ACT nasal spray Commonly known as: FLONASE     TAKE these medications   amoxicillin-clavulanate 250-62.5 MG/5ML suspension Commonly known as: Augmentin Take 10 mLs by mouth 2 (two)  times daily for 7 days.   azelastine 0.05 % ophthalmic solution Commonly known as: OPTIVAR INSTILL 1 DROP INTO RIGHT EYE TWICE A DAY What changed: See the new instructions.   buPROPion 300 MG 24 hr tablet Commonly known as: WELLBUTRIN XL TAKE 1 TABLET BY MOUTH EVERY DAY   calcium carbonate 500 MG chewable tablet Commonly known as: TUMS - dosed in mg elemental calcium Chew 2 tablets by mouth as needed for indigestion or heartburn.   CALCIUM PO Take 1 tablet by mouth daily. When remembers   cetirizine 10 MG tablet Commonly known as: ZYRTEC Take 10 mg by mouth daily.   clonazePAM 1 MG tablet Commonly known as: KLONOPIN Take 1 tablet (1 mg total) by mouth at bedtime as needed for anxiety.   colestipol 1 g tablet Commonly known as: COLESTID TAKE 2 TABLETS (2 G TOTAL) BY MOUTH 2 (TWO) TIMES DAILY. What changed: how much to take   HYDROcodone-acetaminophen 7.5-325 mg/15 ml solution Commonly known as: HYCET Take 10-15 mLs by mouth every 4 (four) hours as needed for moderate pain.   HYDROcodone-acetaminophen 7.5-325 mg/15 ml solution Commonly known as: HYCET Take 10-15 mLs by mouth every 6 (six) hours as needed for up to 7 days for severe pain. Alternate with Motrin 600mg  every 6 hours   ibuprofen 200 MG tablet Commonly known as:  ADVIL Take 800 mg by mouth every 6 (six) hours as needed for moderate pain.   levonorgestrel 20 MCG/24HR IUD Commonly known as: MIRENA 1 each by Intrauterine route once.   multivitamin tablet Take 1 tablet by mouth daily. Gummie   omeprazole 40 MG capsule Commonly known as: PRILOSEC TAKE 1 CAPSULE BY MOUTH TWICE A DAY BEST TO TAKE ON EMPTY STOMACH 20-30 MINUTES BEFORE FOOD What changed: See the new instructions.   triamcinolone ointment 0.1 % Commonly known as: KENALOG APPLY TO AFFECTED AREA TWICE A DAY What changed: See the new instructions.   venlafaxine XR 150 MG 24 hr capsule Commonly known as: EFFEXOR-XR TAKE ONE CAPSULE EVERY DAY  WITH BREAKFAST. What changed: See the new instructions.       Diagnostic Studies: No results found.  Disposition: Discharge disposition: 01-Home or Self Care       Discharge Instructions    Diet - low sodium heart healthy   Complete by: As directed    Diet - low sodium heart healthy   Complete by: As directed    Discharge instructions   Complete by: As directed    Sinus/Nasal Instructions:  1. Limited activity 2. Liquid and soft diet 3. May bathe and shower 4. Saline nasal spray - 4 puffs/nostril every hour while awake, begin the morning after surgery 5. Elevate Head of Bed 6. No nose blowing/Open mouth sneeze 7. Alternate Tylenol and ibuprofen every 6 hours as needed for pain.  Call [facility] for any questions or concerns: [phone]    Tonsillectomy Care After Refer to this sheet in the next few weeks. These instructions provide you with information on caring for yourself after your procedure. Your caregiver may also give you specific instructions. Your treatment has been planned according to current medical practices, but problems sometimes occur. Call your caregiver if you have any problems or questions after your procedure. HOME CARE INSTRUCTIONS  Obtain proper rest, keeping your head elevated at all times. You will feel worn out and tired for a while.  Drink plenty of fluids. This reduces pain and hastens the healing process.  Only take over-the-counter or prescription medicines for pain, discomfort, or fever as directed by your caregiver. Do not take aspirin or nonsteroidal anti-inflammatory drugs. These medications increase the possibility of bleeding.  Sometimes the use of pain medication can cause constipation. If this happens, ask your caregiver about laxatives that you can take.  When eating, only eat a small portion of your food and then take your prescribed pain medication. Eat the remainder of your food 45 minutes later. This will make swallowing less  painful.  Soft and cold foods, such as gelatin, sherbet, ice cream, frozen ice pops, and cold drinks, are usually the easiest to eat. Several days after surgery, you will be able to eat more solid food.  Avoid mouth washes and gargles.  Avoid contact with people who have upper respiratory infections, such as colds and sore throats.  An ice pack applied to your neck may help with discomfort and keep swelling down.  SEEK MEDICAL CARE IF:  You have increasing pain that is not controlled with medications.  You have an oral temperature above 102 F (38.9 C).  You feel lightheaded or have a fainting spell.  You develop a rash.  SEEK IMMEDIATE MEDICAL CARE IF:  You have difficulty breathing.  You experience side effects or allergic reactions to medications.  You bleed bright red blood from your throat, or you vomit bright red  blood.  MAKE SURE YOU: Understand these instructions.  Will watch your condition.  Will get help right away if you are not doing well or get worse.   Alternate ibuprofen and Tylenol every 6 hours as needed for pain management.  CALL [phone] for any questions or Emergency concerns   Increase activity slowly   Complete by: As directed    Increase activity slowly   Complete by: As directed       Follow-up Information    Osborn Coho, MD In 2 weeks.   Specialty: Otolaryngology Contact information: [address] [place] [address] [phone]            Signed: Osborn Coho 01/03/2020, 8:10 AM

## 2020-01-03 NOTE — Progress Notes (Signed)
Norma [name] to be D/C'd  per MD order. Discussed with the patient and all questions fully answered.  VSS, Skin clean, dry and intact without evidence of skin break down, no evidence of skin tears noted.  IV catheter discontinued intact. Site without signs and symptoms of complications. Dressing and pressure applied.  An After Visit Summary was printed and given to the patient. Patient received prescription.  D/c education completed with patient/family including follow up instructions, medication list, d/c activities limitations if indicated, with other d/c instructions as indicated by MD - patient able to verbalize understanding, all questions fully answered.   Patient instructed to return to ED, call 911, or call MD for any changes in condition.   Patient to be escorted via WC, and D/C home via private auto.

## 2020-01-03 NOTE — Anesthesia Postprocedure Evaluation (Signed)
Anesthesia Post Note  Patient: Norma [name]  Procedure(s) Performed: TONSILLECTOMY (Bilateral Mouth) NASAL SEPTOPLASTY WITH TURBINATE REDUCTION (Bilateral Nose)     Patient location during evaluation: PACU Anesthesia Type: General Level of consciousness: awake and alert Pain management: pain level controlled Vital Signs Assessment: post-procedure vital signs reviewed and stable Respiratory status: spontaneous breathing, nonlabored ventilation, respiratory function stable and patient connected to nasal cannula oxygen Cardiovascular status: blood pressure returned to baseline and stable Postop Assessment: no apparent nausea or vomiting Anesthetic complications: no    Last Vitals:  Vitals:   01/03/20 0514 01/03/20 0800  BP: 107/74 127/77  Pulse: 75 64  Resp: 17 18  Temp: 36.5 C 36.4 C  SpO2: 97% 96%    Last Pain:  Vitals:   01/03/20 0828  TempSrc:   PainSc: 0-No pain                 Falon Huesca S

## 2020-02-19 ENCOUNTER — Other Ambulatory Visit: Payer: Self-pay | Admitting: Family Medicine

## 2020-02-22 ENCOUNTER — Ambulatory Visit (INDEPENDENT_AMBULATORY_CARE_PROVIDER_SITE_OTHER): Payer: No Typology Code available for payment source | Admitting: Gastroenterology

## 2020-02-22 ENCOUNTER — Encounter: Payer: Self-pay | Admitting: Gastroenterology

## 2020-02-22 VITALS — BP 124/80 | HR 89 | Ht 67.0 in | Wt 252.5 lb

## 2020-02-22 DIAGNOSIS — K219 Gastro-esophageal reflux disease without esophagitis: Secondary | ICD-10-CM

## 2020-02-22 DIAGNOSIS — K589 Irritable bowel syndrome without diarrhea: Secondary | ICD-10-CM

## 2020-02-22 MED ORDER — DICYCLOMINE HCL 20 MG PO TABS: 20.0000 mg | ORAL_TABLET | Freq: Four times a day (QID) | ORAL | 11 refills | Status: DC

## 2020-02-22 NOTE — Progress Notes (Signed)
Referring Provider: Sheliah Hatch, MD Primary Care Physician:  Sheliah Hatch, MD   Chief complaint: Alternating diarrhea and constipation   IMPRESSION:  LA Class C Reflux with esophageal ulceration on EGD 07/11/19 IBS with alternating bowel habits    - normal colonoscopy 07/11/19 including negative biopsies for microscopic colitis    - normal duodenal biopsies 07/11/19 Prior cholecystectomy 2017 for chronic cholecystitis BMI 40 Fatty liver on ultrasound 2017 Family history of autoimmune disease (mother with psoriasis) Colonoscopy with Dr. Loreta Ave around 2003 No known family history of colon cancer or polyps  Altered bowel habits for several years.  Severe urgency with associated abdominal cramping, change in stool consistency that alternates between explosive watery, loose and gloppy, and constipation. No alarm features.  Suspected bile-acid diarrhea or IBS with some clinical response to colestipol. Will trial dicyclomine +/- psyllium or methylcellulose to improve stool bulk.   Extensive esophagitis with esophageal ulceration without associated symptoms except for dysphonia. Continue PPI BID. Consider repeat EGD if her symptoms persist given the asymptomatic nature of her esophagitis and ulceration.   Plan: - Continue Omeprazole 40 mg BID - Continue GERD lifestyle modifications including working to maintain a healthy weight - Continue colestipol 2 g once BID - Dicyclomine 20 mg QID - If no improvement ot dicyclomine add daily psyllium or methylcellulose once daily, increasing to twice daily if no response to once daily - Plan repeat EGD if symptoms persistent - Follow-up in 3-4 months, earlier if needed - Colonoscopy due 2030  HPI: Norma [name] is a 49 y.o. female  under evaluation of alternating diarrhea and constipation.  She returns in scheduled follow-up. Interval history is obtained through the patient and review of her electronic health record.  She has  anxiety, depression, headaches, sleep apnea, and obesity.  She had a cholecystectomy in 2017 for mild chronic cholecystitis.  Previously seen by Dr. Loreta Ave in 2003 for abdominal pain and constipation.  She has had several years of altered bowel habits. Intermittent severe urge to defecate. Severe straining with severe abdominal cramping while trying to defecate that occurs a few times each week. When she finally goes it's followed by diarrhea. She will have a day or two or loose stools. Bowel habits then return to normal   Stools are a yellow gloppy consistency. Greasy foods trigger her symptoms. Symptoms similar  to prior her cholecystectomy.  Feels like things are worse since then, not better. Some association with stress, but, she doesn't think this is different from her baseline.  Previously treated with Tums PRN and Peptobismal PRN. Has not recently used Imodium. Previously evaluated by Dr. Loreta Ave with a colonoscopy in 2003.    EGD 07/11/19:  - LA Grade C reflux esophagitis with ulceration. No intestinal metaplasia.  - Multiple gastric erosions in the antrum. H pylori negative gastropathy on biopsies.  - Normal duodenum was normal. Duodenal biopsies were normal.   Colonoscopy 07/11/19: - The perianal and digital rectal examinations were normal. - The colon (entire examined portion) appeared normal. - Normal right sided and left sided colon biopsies.   At the time of follow-up 09/13/19 she reported using fewer TUMS on omeprazole 40 mg BID. No additional choking episodes but her voice remained raspy, she continued to need to frequently clear her throat, and complained of persistent daily sore throat.  No heartburn, dysphagia, or odynophagia.  Colestipol improved her diarrhea and abdominal pain.  Also had significant post-nasal drip. Saw Dr. Annalee Genta with ENT. Had tonsillectomy and nasal septoplasy  with turbinate reducation 01/02/20 for nasal septal deviation and tonsillar hypertrophy. Pulmonologist  thinks it will help with her sleep apnea.  Returns in scheduled follow-up. Feeling better since using omeprazole BID. No longer requiring TUMS.  Continues to have some urgency with associated cramping, although improved since the colestipol. Having 3 BM daily that are primarily loose and often watery. Mostly like soft serve ice cream consistency. Has changed her diet to avoid spicy, greasy foods. No blood or mucous. No alarm features.   Labs 03/09/19: hemoglobin 13.2, MCV 87.4, RDW 13.9 Labs 01/02/20: hemoglobin 12.8, MCV 87.3, RDW 13.2, platelets 192   Prior abdominal imaging: Abd ultrasound 06/12/16: hepatic steatosis CT abd/pel with contrast 06/12/16:  No acute findings. Specifically, no findings to explain the patient's history of right upper quadrant pain. Very short segment jejunal enteroenteric intussusception without wall thickening, obstruction, or perienteric edema/inflammation. This is most likely a transient process. HIDA with CCK 07/16/16: GBEF 10%. Patent cystic duct and common bile duct.   Past Medical History:  Diagnosis Date  . Anxiety   . ASCUS (atypical squamous cells of undetermined significance) on Pap smear 07   NEG HPV; BENIGN CHANGES 4/08  . Chronic back pain   . Claustrophobia    with mask on  . Esophagitis   . Family history of adverse reaction to anesthesia    daughter Jinny Blossom low blood pressure during ear tube surgery 2 years ago  . Family history of hypertrophic cardiomyopathy    mother  . GERD (gastroesophageal reflux disease)   . Headache    hx migraines once a year or so now  . Sleep apnea    C PAP  . 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 Urinary incontinence    Stress incontinence    Past Surgical History:  Procedure Laterality Date  . BILATERAL FUSION  86   age 61--Foot  . CHOLECYSTECTOMY N/A 08/13/2016   Procedure: LAPAROSCOPIC CHOLECYSTECTOMY WITH INTRAOPERATIVE CHOLANGIOGRAM;  Surgeon: Jackolyn Confer, MD;  Location:  [facility];  Service: General;  Laterality: N/A;  . GALLBLADDER SURGERY    . INTRAUTERINE DEVICE INSERTION  07/2015   Mirena  . INTRAUTERINE DEVICE INSERTION  05/19/2017   Mirena  . LAPAROSCOPIC ABDOMINAL EXPLORATION  08/13/2016   Procedure: LAPAROSCOPIC  SMALL BOWEL EXPLORATION;  Surgeon: Jackolyn Confer, MD;  Location: [facility];  Service: General;;  . Verdis Frederickson removed  spring 2017  . NASAL SEPTOPLASTY W/ TURBINOPLASTY Bilateral 01/02/2020   Procedure: NASAL SEPTOPLASTY WITH TURBINATE REDUCTION;  Surgeon: Jerrell Belfast, MD;  Location: [place];  Service: ENT;  Laterality: Bilateral;  . TONSILLECTOMY  01/02/2020  . TONSILLECTOMY Bilateral 01/02/2020   Procedure: TONSILLECTOMY;  Surgeon: Jerrell Belfast, MD;  Location: [place];  Service: ENT;  Laterality: Bilateral;    Current Outpatient Medications  Medication Sig Dispense Refill  . azelastine (OPTIVAR) 0.05 % ophthalmic solution INSTILL 1 DROP INTO RIGHT EYE TWICE A DAY (Patient taking differently: Place 1 drop into both eyes daily as needed (allergies). ) 6 mL 1  . buPROPion (WELLBUTRIN XL) 300 MG 24 hr tablet TAKE 1 TABLET BY MOUTH EVERY DAY 90 tablet 2  . CALCIUM PO Take 1 tablet by mouth daily. When remembers    . cetirizine (ZYRTEC) 10 MG tablet Take 10 mg by mouth daily.    . clonazePAM (KLONOPIN) 1 MG tablet Take 1 tablet (1 mg total) by mouth at bedtime as needed for anxiety. 30 tablet 3  . colestipol (COLESTID) 1  g tablet TAKE 2 TABLETS (2 G TOTAL) BY MOUTH 2 (TWO) TIMES DAILY. (Patient taking differently: Take 1 g by mouth 2 (two) times daily. ) 360 tablet 5  . ibuprofen (ADVIL,MOTRIN) 200 MG tablet Take 800 mg by mouth every 6 (six) hours as needed for moderate pain.    Marland Kitchen levonorgestrel (MIRENA) 20 MCG/24HR IUD 1 each by Intrauterine route once.    . Multiple Vitamin (MULTIVITAMIN) tablet Take 1 tablet by mouth daily. Gummie    . omeprazole (PRILOSEC) 40 MG capsule TAKE 1 CAPSULE BY MOUTH TWICE A DAY BEST TO TAKE ON EMPTY STOMACH 20-30  MINUTES BEFORE FOOD 60 capsule 1  . triamcinolone ointment (KENALOG) 0.1 % APPLY TO AFFECTED AREA TWICE A DAY (Patient taking differently: Apply 1 application topically daily as needed (rash on hands). ) 90 g 1  . venlafaxine XR (EFFEXOR-XR) 150 MG 24 hr capsule TAKE ONE CAPSULE EVERY DAY WITH BREAKFAST. 90 capsule 0   No current facility-administered medications for this visit.    Allergies as of 02/22/2020 - Review Complete 01/02/2020  Allergen Reaction Noted  . Codeine Hives 11/22/2008  . Depakote [divalproex sodium] Other (See Comments) 01/03/2013    Family History  Problem Relation Age of Onset  . Hypertension Maternal Grandfather   . Heart disease Maternal Grandfather   . Hypertension Paternal Grandmother   . Heart disease Paternal Grandmother   . Hypertension Paternal Grandfather   . Heart disease Paternal Grandfather   . Depression Father   . Heart failure Maternal Grandmother   . Cardiomyopathy Mother     Social History   Socioeconomic History  . Marital status: Married    Spouse name: Not on file  . Number of children: Not on file  . Years of education: Not on file  . Highest education level: Not on file  Occupational History  . Not on file  Tobacco Use  . Smoking status: Never Smoker  . Smokeless tobacco: Never Used  Vaping Use  . Vaping Use: Never used  Substance and Sexual Activity  . Alcohol use: Yes    Alcohol/week: 0.0 standard drinks    Comment: SOCIALLY; Rare  . Drug use: No  . Sexual activity: Yes    Birth control/protection: I.U.D.    Comment: 1st intercourse 49 yo-Fewer than 5 partners Mirena IUD 05/19/2017  Other Topics Concern  . Not on file  Social History Narrative  . Not on file   Social Determinants of Health   Financial Resource Strain:   . Difficulty of Paying Living Expenses:   Food Insecurity:   . Worried About Programme researcher, broadcasting/film/video in the Last Year:   . Barista in the Last Year:   Transportation Needs:   . Automotive engineer (Medical):   Marland Kitchen Lack of Transportation (Non-Medical):   Physical Activity:   . Days of Exercise per Week:   . Minutes of Exercise per Session:   Stress:   . Feeling of Stress :   Social Connections:   . Frequency of Communication with Friends and Family:   . Frequency of Social Gatherings with Friends and Family:   . Attends Religious Services:   . Active Member of Clubs or Organizations:   . Attends Banker Meetings:   Marland Kitchen Marital Status:   Intimate Partner Violence:   . Fear of Current or Ex-Partner:   . Emotionally Abused:   Marland Kitchen Physically Abused:   . Sexually Abused:     Physical Exam:  General:   Alert,  well-nourished, pleasant and cooperative in NAD. Continues to have a raspy voice that does not sound changed following her surgery.  Head:  Normocephalic and atraumatic. Abdomen:  Soft, nontender, nondistended, normal bowel sounds, no rebound or guarding. No hepatosplenomegaly.   Neurologic:  Alert and  oriented x4;  grossly nonfocal Skin:  Intact without significant lesions or rashes. Psych:  Alert and cooperative. Normal mood and affect.      Ozias Dicenzo L. Orvan Falconer, MD, MPH 02/22/2020, 9:59 AM

## 2020-02-22 NOTE — Patient Instructions (Addendum)
If you are age 47 or older, your body mass index should be between 23-30. Your Body mass index is 39.55 kg/m. If this is out of the aforementioned range listed, please consider follow up with your Primary Care Provider.  If you are age 52 or younger, your body mass index should be between 19-25. Your Body mass index is 39.55 kg/m. If this is out of the aformentioned range listed, please consider follow up with your Primary Care Provider.   Continue omeprazole 40 mg twice daily.   Continue to take your colestipol 2 grams twice daily.  We have sent the following medications to your pharmacy for you to pick up at your convenience:  START: dicyclomine 20 mg four times a day to see if we can improve your symptoms that occur after eating.   If no improvement, please add a daily dose of psyllium or methylcellulose (Benefiber, Metumucil, Citrucel) every morning. If your bowel habits have not improved after one week, add a second dose in the evening.  Follow-up in 3-4 months, earlier if needed.   Thank you for entrusting me with your care and choosing [facility].  Dr Orvan Falconer

## 2020-03-20 ENCOUNTER — Telehealth: Payer: Self-pay

## 2020-03-20 NOTE — Telephone Encounter (Signed)
Left message on machine to return call to schedule appointment. Patient needs to follow up with Dr Orvan Falconer (3-4 months) GERD IBS in Sept/Oct.  Will continue efforts.

## 2020-03-22 ENCOUNTER — Other Ambulatory Visit: Payer: Self-pay | Admitting: Gastroenterology

## 2020-03-22 DIAGNOSIS — K2970 Gastritis, unspecified, without bleeding: Secondary | ICD-10-CM

## 2020-03-24 NOTE — Telephone Encounter (Signed)
Left message on machine to return call to schedule appointment. Will continue efforts.

## 2020-03-28 ENCOUNTER — Telehealth: Payer: Self-pay | Admitting: Gastroenterology

## 2020-03-28 NOTE — Telephone Encounter (Signed)
Norma [name] I am not DOD but can help answer this question, it is Dr. Daleen Bo patient and she is out today.  She had significant ulceration of her lower esophagus, she should be on omeprazole twice daily. Hopefully the ulceration is improved but generally would recommend against long term NSAID use in this situation. If she wants to use a very short course or intermittent rare use that may be okay if she is on PPI

## 2020-03-28 NOTE — Telephone Encounter (Signed)
Spoke with Verlon Au, [place] at the orthopedic office. She is asking if patient can take Celebrex 100 mg BID for should pain.  Hx GERD. Esophageal ulcer.Please, advise

## 2020-03-28 NOTE — Telephone Encounter (Signed)
Verlon Au from [facility] office called inquiring if it is ok to prescribe pt Celebrex given her esophageal ulcer. Pls call Verlon Au at [phone].

## 2020-03-31 NOTE — Telephone Encounter (Signed)
Appointment scheduled to see Dr Orvan Falconer on 06/09/20 at 8:50am.  Patient aware.

## 2020-03-31 NOTE — Telephone Encounter (Signed)
Office aware

## 2020-05-09 ENCOUNTER — Other Ambulatory Visit: Payer: Self-pay | Admitting: Orthopaedic Surgery

## 2020-05-09 DIAGNOSIS — M25511 Pain in right shoulder: Secondary | ICD-10-CM

## 2020-05-15 ENCOUNTER — Other Ambulatory Visit: Payer: Self-pay | Admitting: Family Medicine

## 2020-05-22 ENCOUNTER — Other Ambulatory Visit: Payer: Self-pay | Admitting: Family Medicine

## 2020-05-30 ENCOUNTER — Ambulatory Visit
Admission: RE | Admit: 2020-05-30 | Discharge: 2020-05-30 | Disposition: A | Discharge status: Home / Self Care | Payer: No Typology Code available for payment source | Source: Ambulatory Visit | Attending: Orthopaedic Surgery | Admitting: Orthopaedic Surgery

## 2020-05-30 ENCOUNTER — Other Ambulatory Visit: Payer: Self-pay

## 2020-05-30 DIAGNOSIS — M25511 Pain in right shoulder: Secondary | ICD-10-CM

## 2020-06-06 ENCOUNTER — Other Ambulatory Visit: Payer: Self-pay | Admitting: Family Medicine

## 2020-06-06 ENCOUNTER — Encounter: Payer: Self-pay | Admitting: *Deleted

## 2020-06-06 NOTE — Telephone Encounter (Signed)
Ok for medication refill?

## 2020-06-09 ENCOUNTER — Encounter: Payer: Self-pay | Admitting: Gastroenterology

## 2020-06-09 ENCOUNTER — Ambulatory Visit: Payer: No Typology Code available for payment source | Admitting: Gastroenterology

## 2020-06-09 VITALS — BP 124/88 | HR 72 | Ht 67.0 in | Wt 262.6 lb

## 2020-06-09 DIAGNOSIS — K219 Gastro-esophageal reflux disease without esophagitis: Secondary | ICD-10-CM

## 2020-06-09 DIAGNOSIS — R197 Diarrhea, unspecified: Secondary | ICD-10-CM

## 2020-06-09 DIAGNOSIS — K589 Irritable bowel syndrome without diarrhea: Secondary | ICD-10-CM

## 2020-06-09 MED ORDER — COLESTIPOL HCL 1 G PO TABS: 2.0000 g | ORAL_TABLET | Freq: Two times a day (BID) | ORAL | 0 refills | Status: DC

## 2020-06-09 MED ORDER — DICYCLOMINE HCL 20 MG PO TABS: 20.0000 mg | ORAL_TABLET | Freq: Four times a day (QID) | ORAL | 0 refills | Status: DC

## 2020-06-09 NOTE — Progress Notes (Signed)
Referring Provider: Sheliah Hatch, MD Primary Care Physician:  Sheliah Hatch, MD   Chief complaint: Alternating diarrhea and constipation   IMPRESSION:  LA Class C Reflux with esophageal ulceration on EGD 07/11/19 IBS with alternating bowel habits    - normal colonoscopy 07/11/19 including negative biopsies for microscopic colitis    - normal duodenal biopsies 07/11/19 Prior cholecystectomy 2017 for chronic cholecystitis BMI 40 Fatty liver on ultrasound 2017 Family history of autoimmune disease (mother with psoriasis) Colonoscopy with Dr. Loreta Ave around 2003 No known family history of colon cancer or polyps  Altered bowel habits for several years.  Likely IBS given the severe urgency with associated abdominal cramping, change in stool consistency that alternates between explosive watery, loose and gloppy, and constipation. No alarm features.  Some clinical response to colestipol and dicyclomine. Will add Benefiber to improve stool bulk. Discussed dietary measures to avoid diarrhea/loose stools. Consider trial of Xifaxan if symptoms persist/worsen.   Extensive esophagitis with esophageal ulceration without associated symptoms except for persist dysphonia despite months of PPI BID. Add famotidine 20 mg BID. Repeat EGD if her symptoms persist given the asymptomatic nature of her esophagitis and ulceration. Low threshold to referral to ENT for further evaluation.    Plan: - Continue Omeprazole 40 mg BID - Add famotidine 20 mg BID - Low threshold for ENT consult for ongoing dysphonia if no response to famotdiein - Continue colestipol 2 g once BID - Continue Dicyclomine 20 mg QID PRN - Add Benefiber once daily, increasing to twice daily if no response to once daily - Discussed avoiding dairy, sugar substitutes - Follow-up in 4-6 months, earlier if needed - Colonoscopy due 2030  HPI: Norma [name] is a 49 y.o. female  under evaluation of alternating diarrhea and  constipation.  She returns in scheduled follow-up. She has anxiety, depression, headaches, sleep apnea, and obesity.  She had a cholecystectomy in 2017 for mild chronic cholecystitis.  Previously seen by Dr. Loreta Ave in 2003 for abdominal pain and constipation.  She has had several years of altered bowel habits. Intermittent severe urge to defecate. Severe straining with severe abdominal cramping while trying to defecate that occurs a few times each week. When she finally goes it's followed by diarrhea. She will have a day or two or loose stools. Bowel habits then return to normal   Stools are a yellow gloppy consistency. Greasy foods trigger her symptoms. Symptoms similar  to prior her cholecystectomy.  Feels like things are worse since then, not better. Some association with stress, but, she doesn't think this is different from her baseline.  Previously treated with Tums PRN and Peptobismal PRN. Has not recently used Imodium. Previously evaluated by Dr. Loreta Ave with a colonoscopy in 2003.    EGD 07/11/19:  LA Grade C reflux esophagitis with ulceration. Multiple gastric erosions in the antrum. H pylori negative gastropathy on biopsies. No intestinal metaplasia. Normal duodenum was normal. Duodenal biopsies were normal.   Colonoscopy 07/11/19: Normal.  Normal right sided and left sided colon biopsies.   At the time of follow-up 09/13/19 she reported using fewer TUMS on omeprazole 40 mg BID. No additional choking episodes but her voice remained raspy, she continued to need to frequently clear her throat, and complained of persistent daily sore throat.  No heartburn, dysphagia, or odynophagia.  Colestipol improved her diarrhea and abdominal pain.  Also had significant post-nasal drip. Saw Dr. Annalee Genta with ENT. Had tonsillectomy and nasal septoplasy with turbinate reducation 01/02/20 for nasal septal deviation  and tonsillar hypertrophy.Less sore throat and no further snoring following surgery.    At time of  follow-up 02/22/20 she reported feeling better since using omeprazole BID. No longer requiring TUMS.  Continued to have some urgency with associated cramping, although improved since the colestipol. Having 3 BM daily that are primarily loose and often watery. Mostly like soft serve ice cream consistency. Has changed her diet to avoid spicy, greasy foods. No blood or mucous. No alarm features.   Returns today in scheduled follow-up.  IBS is better. Diarrhea is somewhat improved on colestipol 2g BID and dicyclomine before meals. Notices immediate recurrent of symptoms if she misses a dose. Having 2 BM daily. Stools have fluctuating consistency but are less soft and more formed than before.  Less afraid to eat out because of the improvement. Has idnetified Diet Coke as a trigger.  No change in reflux with omeprazole 40 mg BID. Continues to have dysphonia and frequent throat clearing but no true heartburn.   Labs 03/09/19: hemoglobin 13.2, MCV 87.4, RDW 13.9 Labs 01/02/20: hemoglobin 12.8, MCV 87.3, RDW 13.2, platelets 192   Prior abdominal imaging: Abd ultrasound 06/12/16: hepatic steatosis CT abd/pel with contrast 06/12/16:  No acute findings. Specifically, no findings to explain the patient's history of right upper quadrant pain. Very short segment jejunal enteroenteric intussusception without wall thickening, obstruction, or perienteric edema/inflammation. This is most likely a transient process. HIDA with CCK 07/16/16: GBEF 10%. Patent cystic duct and common bile duct.   Past Medical History:  Diagnosis Date  . Anxiety   . ASCUS (atypical squamous cells of undetermined significance) on Pap smear 07   NEG HPV; BENIGN CHANGES 4/08  . Chronic back pain   . Claustrophobia    with mask on  . Esophagitis   . Esophagitis   . Family history of adverse reaction to anesthesia    daughter Aundra Millet low blood pressure during ear tube surgery 2 years ago  . Family history of hypertrophic cardiomyopathy     mother  . GERD (gastroesophageal reflux disease)   . Headache    hx migraines once a year or so now  . Internal hemorrhoids   . Sleep apnea    C PAP  . 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 Urinary incontinence    Stress incontinence    Past Surgical History:  Procedure Laterality Date  . BILATERAL FUSION  51   age 23--Foot  . CHOLECYSTECTOMY N/A 08/13/2016   Procedure: LAPAROSCOPIC CHOLECYSTECTOMY WITH INTRAOPERATIVE CHOLANGIOGRAM;  Surgeon: Avel Peace, MD;  Location: [facility];  Service: General;  Laterality: N/A;  . GALLBLADDER SURGERY    . INTRAUTERINE DEVICE INSERTION  07/2015   Mirena  . INTRAUTERINE DEVICE INSERTION  05/19/2017   Mirena  . LAPAROSCOPIC ABDOMINAL EXPLORATION  08/13/2016   Procedure: LAPAROSCOPIC  SMALL BOWEL EXPLORATION;  Surgeon: Avel Peace, MD;  Location: [facility];  Service: General;;  . Toney Reil removed  spring 2017  . NASAL SEPTOPLASTY W/ TURBINOPLASTY Bilateral 01/02/2020   Procedure: NASAL SEPTOPLASTY WITH TURBINATE REDUCTION;  Surgeon: Osborn Coho, MD;  Location: [facility] OR;  Service: ENT;  Laterality: Bilateral;  . TONSILLECTOMY  01/02/2020  . TONSILLECTOMY Bilateral 01/02/2020   Procedure: TONSILLECTOMY;  Surgeon: Osborn Coho, MD;  Location[facility] OR;  Service: ENT;  Laterality: Bilateral;    Current Outpatient Medications  Medication Sig Dispense Refill  . azelastine (OPTIVAR) 0.05 % ophthalmic solution INSTILL 1 DROP INTO RIGHT EYE TWICE A DAY (Patient  taking differently: Place 1 drop into both eyes daily as needed (allergies). ) 6 mL 1  . buPROPion (WELLBUTRIN XL) 300 MG 24 hr tablet TAKE 1 TABLET BY MOUTH EVERY DAY 90 tablet 2  . CALCIUM PO Take 1 tablet by mouth daily. When remembers    . cetirizine (ZYRTEC) 10 MG tablet Take 10 mg by mouth daily.    . clonazePAM (KLONOPIN) 1 MG tablet Take 1 tablet (1 mg total) by mouth at bedtime as needed for anxiety. 30 tablet 3  . colestipol (COLESTID) 1 g tablet TAKE 2  TABLETS (2 G TOTAL) BY MOUTH 2 (TWO) TIMES DAILY. (Patient taking differently: Take 1 g by mouth 2 (two) times daily. ) 360 tablet 5  . ibuprofen (ADVIL,MOTRIN) 200 MG tablet Take 800 mg by mouth every 6 (six) hours as needed for moderate pain.    Marland Kitchen. levonorgestrel (MIRENA) 20 MCG/24HR IUD 1 each by Intrauterine route once.    . Multiple Vitamin (MULTIVITAMIN) tablet Take 1 tablet by mouth daily. Gummie    . omeprazole (PRILOSEC) 40 MG capsule TAKE 1 CAPSULE BY MOUTH TWICE A DAY BEST TO TAKE ON EMPTY STOMACH 20-30 MINUTES BEFORE FOOD 90 capsule 1  . triamcinolone ointment (KENALOG) 0.1 % APPLY TO AFFECTED AREA TWICE A DAY 90 g 1  . venlafaxine XR (EFFEXOR-XR) 150 MG 24 hr capsule TAKE ONE CAPSULE EVERY DAY WITH BREAKFAST. 90 capsule 0   No current facility-administered medications for this visit.    Allergies as of 06/09/2020 - Review Complete 06/09/2020  Allergen Reaction Noted  . Codeine Hives 11/22/2008  . Depakote [divalproex sodium] Other (See Comments) 01/03/2013    Family History  Problem Relation Age of Onset  . Hypertension Maternal Grandfather   . Heart disease Maternal Grandfather   . Hypertension Paternal Grandmother   . Heart disease Paternal Grandmother   . Hypertension Paternal Grandfather   . Heart disease Paternal Grandfather   . Depression Father   . Heart failure Maternal Grandmother   . Cardiomyopathy Mother     Social History   Socioeconomic History  . Marital status: Married    Spouse name: Not on file  . Number of children: Not on file  . Years of education: Not on file  . Highest education level: Not on file  Occupational History  . Not on file  Tobacco Use  . Smoking status: Never Smoker  . Smokeless tobacco: Never Used  Vaping Use  . Vaping Use: Never used  Substance and Sexual Activity  . Alcohol use: Yes    Alcohol/week: 0.0 standard drinks    Comment: SOCIALLY; Rare  . Drug use: No  . Sexual activity: Yes    Birth control/protection:  I.U.D.    Comment: 1st intercourse 49 yo-Fewer than 5 partners Mirena IUD 05/19/2017  Other Topics Concern  . Not on file  Social History Narrative  . Not on file   Social Determinants of Health   Financial Resource Strain:   . Difficulty of Paying Living Expenses: Not on file  Food Insecurity:   . Worried About Programme researcher, broadcasting/film/videounning Out of Food in the Last Year: Not on file  . Ran Out of Food in the Last Year: Not on file  Transportation Needs:   . Lack of Transportation (Medical): Not on file  . Lack of Transportation (Non-Medical): Not on file  Physical Activity:   . Days of Exercise per Week: Not on file  . Minutes of Exercise per Session: Not on file  Stress:   .  Feeling of Stress : Not on file  Social Connections:   . Frequency of Communication with Friends and Family: Not on file  . Frequency of Social Gatherings with Friends and Family: Not on file  . Attends Religious Services: Not on file  . Active Member of Clubs or Organizations: Not on file  . Attends Banker Meetings: Not on file  . Marital Status: Not on file  Intimate Partner Violence:   . Fear of Current or Ex-Partner: Not on file  . Emotionally Abused: Not on file  . Physically Abused: Not on file  . Sexually Abused: Not on file    Physical Exam: General:   Alert,  well-nourished, pleasant and cooperative in NAD. Continues to have a raspy voice that does not sound changed following her surgery or her last visit.  Head:  Normocephalic and atraumatic. Abdomen:  Soft, nontender, nondistended, normal bowel sounds, no rebound or guarding. No hepatosplenomegaly.   Neurologic:  Alert and  oriented x4;  grossly nonfocal Skin:  Intact without significant lesions or rashes. Psych:  Alert and cooperative. Normal mood and affect.      Arno Cullers L. Orvan Falconer, MD, MPH 06/09/2020, 8:57 AM

## 2020-06-09 NOTE — Patient Instructions (Addendum)
Continue omeprazole 40 mg twice daily. Please start taking famotidine 20 mg twice daily as well. You may take this at the same time as the omeprazole.   Continue GERD lifestyle modifications including working to maintain a healthy weight. If your symptoms are not improving, we should have you see your ENT to have your vocal cords evaluated.   Given your diarrhea and IBS, please continue colestipol 2 grams twice daily.   We have sent the following medications to your pharmacy for you to pick up at your convenience: Dicyclomine 20 mg four times daily - taking prior to meals as we previously discussed.   If you do not notice continued improvement, please add a daily psyllium or methylcellulose supplement once daily, increasing to twice daily if needed. Benefiber is the option that we particularly discussed. These supplements can be purchased over the counter.  Dairy and sugar substitutes may be contributing to your bowel habits. You may want to strictly avoid one for a couple of weeks to see if your symptoms improve, and then try avoiding the other.   You will be due for a recall colonoscopy in 06/2029. We will send you a reminder in the mail when it gets closer to that time.  Please follow up with Dr Orvan Falconer in 4-6 months in the office, earlier if needed.  If you are age 30 or younger, your body mass index should be between 19-25. Your Body mass index is 41.13 kg/m. If this is out of the aformentioned range listed, please consider follow up with your Primary Care Provider.   Due to recent changes in healthcare laws, you may see the results of your imaging and laboratory studies on MyChart before your provider has had a chance to review them.  We understand that in some cases there may be results that are confusing or concerning to you. Not all laboratory results come back in the same time frame and the provider may be waiting for multiple results in order to interpret others.  Please give [place] 48  hours in order for your provider to thoroughly review all the results before contacting the office for clarification of your results.

## 2020-06-16 ENCOUNTER — Encounter: Payer: Self-pay | Admitting: Family Medicine

## 2020-06-16 ENCOUNTER — Other Ambulatory Visit: Payer: Self-pay

## 2020-06-16 ENCOUNTER — Ambulatory Visit (INDEPENDENT_AMBULATORY_CARE_PROVIDER_SITE_OTHER): Payer: No Typology Code available for payment source | Admitting: Family Medicine

## 2020-06-16 VITALS — BP 138/80 | HR 76 | Temp 97.8°F | Resp 20 | Wt 262.2 lb

## 2020-06-16 DIAGNOSIS — F418 Other specified anxiety disorders: Secondary | ICD-10-CM

## 2020-06-16 DIAGNOSIS — E669 Obesity, unspecified: Secondary | ICD-10-CM | POA: Insufficient documentation

## 2020-06-16 NOTE — Assessment & Plan Note (Signed)
Ongoing issue for pt.  She is having memory concerns and was wondering if this was possibly related to her Wellbutrin.  Discussed that her memory issues are more likely due to being overwhelmed and having too many things to attend to rather than actually forgetting.  Discussed that she likely isn't able to process the info in the first place and so there is no chance to retain the information.  Once we discussed things, she feels much better and is not interested in adjusting medication.  Encouraged her to try and be more present.  Will follow.

## 2020-06-16 NOTE — Patient Instructions (Signed)
Schedule your complete physical in 3 months No med changes at this time Try and be more present during conversations and write things down as needed We'll call you with your Medical Weight Management appt Continue to work on healthy diet and regular exercise- you can do it! Call with any questions or concerns Stay Safe!  Stay Healthy!

## 2020-06-16 NOTE — Assessment & Plan Note (Signed)
Deteriorated.  She has gained 12 lbs since last summer and BMI is now 41.07  Discussed need for healthy diet and regular exercise.  She is interested in a referral to Medical Weight Management.  Referral placed.  Will follow closely.

## 2020-06-16 NOTE — Progress Notes (Signed)
   Subjective:    Patient ID: Norma [name], female    DOB: December 13, 1970, 49 y.o.   MRN: [id number]  HPI Anxiety- currently on Wellbutrin XL 300mg  daily and Effexor 150mg  daily.  Pt reports she is having 'a memory problem'.  Friend told her this may be related to Wellbutrin.  Pt feels that memory issues have been getting progressively worse.  Will forget what she is saying mid-sentence.  Will forget conversations that she has had.  Forgetting to do certain tasks- mailing a check.  'if I don't write it down, I will not remember it'.  Pt admits to increased stress recently.  Thinks physically she feels good and isn't sure she wants to change meds.  Obesity- pt has gained 12 lbs since last summer and BMI is now 41.07.  Pt feels that she is constantly functioning under high stress.  Pt has been eating better and cooking at home due to COVID.  Pt is walking regularly, doing some sit-ups, planks.     Review of Systems For ROS see HPI   This visit occurred during the SARS-CoV-2 public health emergency.  Safety protocols were in place, including screening questions prior to the visit, additional usage of staff PPE, and extensive cleaning of exam room while observing appropriate contact time as indicated for disinfecting solutions.       Objective:   Physical Exam Vitals reviewed.  Constitutional:      General: She is not in acute distress.    Appearance: Normal appearance. She is obese. She is not ill-appearing.  HENT:     Head: Normocephalic and atraumatic.  Eyes:     Extraocular Movements: Extraocular movements intact.     Conjunctiva/sclera: Conjunctivae normal.     Pupils: Pupils are equal, round, and reactive to light.  Skin:    General: Skin is warm and dry.  Neurological:     General: No focal deficit present.     Mental Status: She is alert and oriented to person, place, and time.     Cranial Nerves: No cranial nerve deficit.  Psychiatric:        Mood and Affect: Mood normal.         Behavior: Behavior normal.        Thought Content: Thought content normal.           Assessment & Plan:

## 2020-08-07 ENCOUNTER — Other Ambulatory Visit: Payer: Self-pay

## 2020-08-07 ENCOUNTER — Encounter (HOSPITAL_BASED_OUTPATIENT_CLINIC_OR_DEPARTMENT_OTHER): Payer: Self-pay | Admitting: Orthopaedic Surgery

## 2020-08-09 ENCOUNTER — Other Ambulatory Visit (HOSPITAL_COMMUNITY)
Admission: RE | Admit: 2020-08-09 | Discharge: 2020-08-09 | Disposition: A | Discharge status: Home / Self Care | Payer: No Typology Code available for payment source | Source: Ambulatory Visit | Attending: Orthopaedic Surgery | Admitting: Orthopaedic Surgery

## 2020-08-09 DIAGNOSIS — Z01812 Encounter for preprocedural laboratory examination: Secondary | ICD-10-CM | POA: Insufficient documentation

## 2020-08-09 DIAGNOSIS — Z20822 Contact with and (suspected) exposure to covid-19: Secondary | ICD-10-CM | POA: Insufficient documentation

## 2020-08-10 LAB — SARS CORONAVIRUS 2 (TAT 6-24 HRS): SARS Coronavirus 2: NEGATIVE

## 2020-08-12 NOTE — Progress Notes (Signed)

## 2020-08-12 NOTE — H&P (Signed)
PREOPERATIVE H&P  Chief Complaint: RIGHT SHOULDER PRIMARY OSTEOARTHRITIS, IMPINGEMENT SYNDROME, BICIPITAL TENDINITIS  HPI: Norma [name] is a 49 y.o. female who is scheduled for, Procedure(s): SHOULDER ARTHROSCOPY WITH EXTENSIVE DEBRIDEMENT; DISTAL CLAVICULECTOMY SHOULDER ARTHROSCOPY WITH SUBACROMIAL DECOMPRESSION PARTIAL ACROMIOPLASTY WITH CORACOACROMIAL RELEASE AND BICEP TENDODESIS.   Patient has a past medical history significant for GERD, esophagitis.   The patient has had right shoulder pain for over 6 months now.  We treated her conservatively with an injection and physical therapy.  We could not do antiinflammatories as she has a history of esophageal ulcers.  She had temporary relief from her injection with Dr. Lucie Leather, but she hasn't found that she had much progress since then.  She actually had a recurrence of her symptoms.  She continues to have anterior shoulder pain and pain with range of motion.  She is unhappy with her shoulder currently.  We have tried and failed many nonoperative measures at this point, and the patient continue to have pain. She is frustrated by her lack of progress.  Her symptoms are rated as moderate to severe, and have been worsening.  This is significantly impairing activities of daily living.    Please see clinic note for further details on this patient's care.    She has elected for surgical management.   Past Medical History:  Diagnosis Date  . Anxiety   . ASCUS (atypical squamous cells of undetermined significance) on Pap smear 07   NEG HPV; BENIGN CHANGES 4/08  . Chronic back pain   . Claustrophobia    with mask on  . Esophagitis   . Esophagitis   . Family history of adverse reaction to anesthesia    daughter Aundra Millet low blood pressure during ear tube surgery 2 years ago  . Family history of hypertrophic cardiomyopathy    mother  . GERD (gastroesophageal reflux disease)   . Headache    hx migraines once a year or so now  .  Internal hemorrhoids   . PONV (postoperative nausea and vomiting)   . Sleep apnea    C PAP  . 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 Urinary incontinence    Stress incontinence   Past Surgical History:  Procedure Laterality Date  . BILATERAL FUSION  27   age 10--Foot  . CHOLECYSTECTOMY N/A 08/13/2016   Procedure: LAPAROSCOPIC CHOLECYSTECTOMY WITH INTRAOPERATIVE CHOLANGIOGRAM;  Surgeon: Avel Peace, MD;  Location: [facility];  Service: General;  Laterality: N/A;  . GALLBLADDER SURGERY    . INTRAUTERINE DEVICE INSERTION  07/2015   Mirena  . INTRAUTERINE DEVICE INSERTION  05/19/2017   Mirena  . LAPAROSCOPIC ABDOMINAL EXPLORATION  08/13/2016   Procedure: LAPAROSCOPIC  SMALL BOWEL EXPLORATION;  Surgeon: Avel Peace, MD;  Location: [facility];  Service: General;;  . Toney Reil removed  spring 2017  . NASAL SEPTOPLASTY W/ TURBINOPLASTY Bilateral 01/02/2020   Procedure: NASAL SEPTOPLASTY WITH TURBINATE REDUCTION;  Surgeon: Osborn Coho, MD;  Location: [facility] OR;  Service: ENT;  Laterality: Bilateral;  . TONSILLECTOMY  01/02/2020  . TONSILLECTOMY Bilateral 01/02/2020   Procedure: TONSILLECTOMY;  Surgeon: Osborn Coho, MD;  Location: [facility] OR;  Service: ENT;  Laterality: Bilateral;   Social History   Socioeconomic History  . Marital status: Married    Spouse name: Not on file  . Number of children: Not on file  . Years of education: Not on file  . Highest education level: Not on file  Occupational History  . Not  on file  Tobacco Use  . Smoking status: Never Smoker  . Smokeless tobacco: Never Used  Vaping Use  . Vaping Use: Never used  Substance and Sexual Activity  . Alcohol use: Yes    Alcohol/week: 0.0 standard drinks    Comment: SOCIALLY; Rare  . Drug use: No  . Sexual activity: Yes    Birth control/protection: I.U.D.    Comment: 1st intercourse 49 yo-Fewer than 5 partners Mirena IUD 05/19/2017  Other Topics Concern  . Not on file  Social History  Narrative  . Not on file   Social Determinants of Health   Financial Resource Strain: Not on file  Food Insecurity: Not on file  Transportation Needs: Not on file  Physical Activity: Not on file  Stress: Not on file  Social Connections: Not on file   Family History  Problem Relation Age of Onset  . Hypertension Maternal Grandfather   . Heart disease Maternal Grandfather   . Hypertension Paternal Grandmother   . Heart disease Paternal Grandmother   . Hypertension Paternal Grandfather   . Heart disease Paternal Grandfather   . Depression Father   . Heart failure Maternal Grandmother   . Cardiomyopathy Mother    Allergies  Allergen Reactions  . Codeine Hives  . Depakote [Divalproex Sodium] Other (See Comments)    blisters   Prior to Admission medications   Medication Sig Start Date End Date Taking? Authorizing Provider  buPROPion (WELLBUTRIN XL) 300 MG 24 hr tablet TAKE 1 TABLET BY MOUTH EVERY DAY 05/22/20  Yes Sheliah Hatch, MD  calcium carbonate (OS-CAL) 1250 (500 Ca) MG chewable tablet Chew by mouth.   Yes [provider]  CALCIUM PO Take 1 tablet by mouth daily. When remembers   Yes [provider]  ibuprofen (ADVIL,MOTRIN) 200 MG tablet Take 800 mg by mouth every 6 (six) hours as needed for moderate pain.   Yes [provider]  levonorgestrel (MIRENA) 20 MCG/24HR IUD 1 each by Intrauterine route once.   Yes [provider]  omeprazole (PRILOSEC) 40 MG capsule TAKE 1 CAPSULE BY MOUTH TWICE A DAY BEST TO TAKE ON EMPTY STOMACH 20-30 MINUTES BEFORE FOOD 03/23/20  Yes Tressia Danas, MD  triamcinolone ointment (KENALOG) 0.1 % APPLY TO AFFECTED AREA TWICE A DAY 06/06/20  Yes Sheliah Hatch, MD  venlafaxine XR (EFFEXOR-XR) 150 MG 24 hr capsule TAKE ONE CAPSULE EVERY DAY WITH BREAKFAST. 05/15/20  Yes Sheliah Hatch, MD  cetirizine (ZYRTEC) 10 MG tablet Take 10 mg by mouth daily.    [provider]  clonazePAM (KLONOPIN) 1  MG tablet Take 1 tablet (1 mg total) by mouth at bedtime as needed for anxiety. 12/28/19   Sheliah Hatch, MD  colestipol (COLESTID) 1 g tablet Take 2 tablets (2 g total) by mouth 2 (two) times daily. 06/09/20   Tressia Danas, MD  dicyclomine (BENTYL) 20 MG tablet Take 1 tablet (20 mg total) by mouth in the morning, at noon, in the evening, and at bedtime. 06/09/20   Tressia Danas, MD    ROS: All other systems have been reviewed and were otherwise negative with the exception of those mentioned in the HPI and as above.  Physical Exam: General: Alert, no acute distress Cardiovascular: No pedal edema Respiratory: No cyanosis, no use of accessory musculature GI: No organomegaly, abdomen is soft and non-tender Skin: No lesions in the area of chief complaint Neurologic: Sensation intact distally Psychiatric: Patient is competent for consent with normal mood and  affect Lymphatic: No axillary or cervical lymphadenopathy  MUSCULOSKELETAL:  Right shoulder: Positive O'Brien's; AC tenderness to palpation and impingement.  Cuff strength is relatively intact, though she has some mild pain with infraspinatus testing.  Imaging: MRI demonstrates some medialization of the biceps with concern for some subscapularis pathology, but it is not completely clear.  The remainder of the cuff has some tendinosis, but no obvious tearing.  Assessment: Biceps pathology concerning for biceps tendonitis; AC arthrosis and impingement.  Plan: Plan for Procedure(s): SHOULDER ARTHROSCOPY WITH EXTENSIVE DEBRIDEMENT; DISTAL CLAVICULECTOMY SHOULDER ARTHROSCOPY WITH SUBACROMIAL DECOMPRESSION PARTIAL ACROMIOPLASTY WITH CORACOACROMIAL RELEASE AND BICEP TENDODESIS  The risks benefits and alternatives were discussed with the patient including but not limited to the risks of nonoperative treatment, versus surgical intervention including infection, bleeding, nerve injury,  blood clots, cardiopulmonary complications,  morbidity, mortality, among others, and they were willing to proceed.   The patient acknowledged the explanation, agreed to proceed with the plan and consent was signed.   Operative Plan: Right shoulder scope with decompression, distal clavicle excision, and a biceps tenodesis  Discharge Medications: Tylenol, Oxycodone, Zofran, Robaxin (avoid NSAIDs hx of esophageal ulcers) DVT Prophylaxis: None Physical Therapy: Outpatient PT Special Discharge needs: Sling   Vernetta Honey, PA-C  08/12/2020 6:00 PM

## 2020-08-13 ENCOUNTER — Ambulatory Visit (HOSPITAL_BASED_OUTPATIENT_CLINIC_OR_DEPARTMENT_OTHER)
Admission: RE | Admit: 2020-08-13 | Discharge: 2020-08-13 | Disposition: A | Discharge status: Home / Self Care | Payer: No Typology Code available for payment source | Attending: Orthopaedic Surgery | Admitting: Orthopaedic Surgery

## 2020-08-13 ENCOUNTER — Other Ambulatory Visit: Payer: Self-pay

## 2020-08-13 ENCOUNTER — Encounter (HOSPITAL_BASED_OUTPATIENT_CLINIC_OR_DEPARTMENT_OTHER)
Admission: RE | Disposition: A | Discharge status: Home / Self Care | Payer: Self-pay | Source: Home / Self Care | Attending: Orthopaedic Surgery

## 2020-08-13 ENCOUNTER — Ambulatory Visit (HOSPITAL_BASED_OUTPATIENT_CLINIC_OR_DEPARTMENT_OTHER): Payer: No Typology Code available for payment source | Admitting: Anesthesiology

## 2020-08-13 ENCOUNTER — Encounter (HOSPITAL_BASED_OUTPATIENT_CLINIC_OR_DEPARTMENT_OTHER): Payer: Self-pay | Admitting: Orthopaedic Surgery

## 2020-08-13 DIAGNOSIS — M7541 Impingement syndrome of right shoulder: Secondary | ICD-10-CM | POA: Diagnosis not present

## 2020-08-13 DIAGNOSIS — M19011 Primary osteoarthritis, right shoulder: Secondary | ICD-10-CM | POA: Insufficient documentation

## 2020-08-13 DIAGNOSIS — Z885 Allergy status to narcotic agent status: Secondary | ICD-10-CM | POA: Insufficient documentation

## 2020-08-13 DIAGNOSIS — Z975 Presence of (intrauterine) contraceptive device: Secondary | ICD-10-CM | POA: Insufficient documentation

## 2020-08-13 DIAGNOSIS — Z888 Allergy status to other drugs, medicaments and biological substances status: Secondary | ICD-10-CM | POA: Insufficient documentation

## 2020-08-13 DIAGNOSIS — Z79899 Other long term (current) drug therapy: Secondary | ICD-10-CM | POA: Insufficient documentation

## 2020-08-13 DIAGNOSIS — Z9049 Acquired absence of other specified parts of digestive tract: Secondary | ICD-10-CM | POA: Insufficient documentation

## 2020-08-13 HISTORY — DX: Other specified postprocedural states: Z98.890

## 2020-08-13 HISTORY — PX: SHOULDER ARTHROSCOPY WITH DISTAL CLAVICLE RESECTION: SHX5675

## 2020-08-13 HISTORY — PX: BICEPT TENODESIS: SHX5116

## 2020-08-13 HISTORY — DX: Nausea with vomiting, unspecified: R11.2

## 2020-08-13 LAB — POCT PREGNANCY, URINE: Preg Test, Ur: NEGATIVE

## 2020-08-13 SURGERY — SHOULDER ARTHROSCOPY WITH DISTAL CLAVICLE RESECTION
Anesthesia: Regional | Site: Shoulder | Laterality: Right

## 2020-08-13 MED ORDER — MIDAZOLAM HCL 2 MG/2ML IJ SOLN
INTRAMUSCULAR | Status: AC
  Filled 2020-08-13: qty 2

## 2020-08-13 MED ORDER — LACTATED RINGERS IV SOLN: INTRAVENOUS | Status: DC

## 2020-08-13 MED ORDER — MIDAZOLAM HCL 2 MG/2ML IJ SOLN
INTRAMUSCULAR | Status: DC | PRN
  Administered 2020-08-13 (×2): 1 mg via INTRAVENOUS

## 2020-08-13 MED ORDER — LIDOCAINE 2% (20 MG/ML) 5 ML SYRINGE
INTRAMUSCULAR | Status: AC
  Filled 2020-08-13: qty 5

## 2020-08-13 MED ORDER — PROPOFOL 10 MG/ML IV BOLUS
INTRAVENOUS | Status: DC | PRN
  Administered 2020-08-13: 160 mg via INTRAVENOUS

## 2020-08-13 MED ORDER — FENTANYL CITRATE (PF) 100 MCG/2ML IJ SOLN
INTRAMUSCULAR | Status: AC
  Filled 2020-08-13: qty 2

## 2020-08-13 MED ORDER — BUPIVACAINE-EPINEPHRINE (PF) 0.5% -1:200000 IJ SOLN
INTRAMUSCULAR | Status: DC | PRN
  Administered 2020-08-13: 20 mL via PERINEURAL

## 2020-08-13 MED ORDER — FENTANYL CITRATE (PF) 100 MCG/2ML IJ SOLN: 25.0000 ug | INTRAMUSCULAR | Status: DC | PRN

## 2020-08-13 MED ORDER — SUGAMMADEX SODIUM 500 MG/5ML IV SOLN: INTRAVENOUS | Status: DC | PRN

## 2020-08-13 MED ORDER — PHENYLEPHRINE HCL (PRESSORS) 10 MG/ML IV SOLN
INTRAVENOUS | Status: DC | PRN
  Administered 2020-08-13 (×2): 80 ug via INTRAVENOUS
  Administered 2020-08-13 (×2): 120 ug via INTRAVENOUS

## 2020-08-13 MED ORDER — GLYCOPYRROLATE 0.2 MG/ML IJ SOLN
INTRAMUSCULAR | Status: AC
  Filled 2020-08-13: qty 1

## 2020-08-13 MED ORDER — DEXAMETHASONE SODIUM PHOSPHATE 10 MG/ML IJ SOLN
INTRAMUSCULAR | Status: DC | PRN
  Administered 2020-08-13: 5 mg via INTRAVENOUS

## 2020-08-13 MED ORDER — ROCURONIUM BROMIDE 100 MG/10ML IV SOLN
INTRAVENOUS | Status: DC | PRN
  Administered 2020-08-13: 50 mg via INTRAVENOUS

## 2020-08-13 MED ORDER — EPHEDRINE SULFATE 50 MG/ML IJ SOLN
INTRAMUSCULAR | Status: DC | PRN
  Administered 2020-08-13: 10 mg via INTRAVENOUS
  Administered 2020-08-13: 15 mg via INTRAVENOUS

## 2020-08-13 MED ORDER — GLYCOPYRROLATE 0.2 MG/ML IJ SOLN
INTRAMUSCULAR | Status: DC | PRN
  Administered 2020-08-13: .1 mg via INTRAVENOUS

## 2020-08-13 MED ORDER — ONDANSETRON HCL 4 MG/2ML IJ SOLN
INTRAMUSCULAR | Status: AC
  Filled 2020-08-13: qty 2

## 2020-08-13 MED ORDER — SODIUM CHLORIDE 0.9 % IR SOLN: Status: DC | PRN

## 2020-08-13 MED ORDER — METHOCARBAMOL 500 MG PO TABS: 500.0000 mg | ORAL_TABLET | Freq: Three times a day (TID) | ORAL | 0 refills | Status: DC | PRN

## 2020-08-13 MED ORDER — SCOPOLAMINE 1 MG/3DAYS TD PT72
1.0000 | MEDICATED_PATCH | TRANSDERMAL | Status: DC
  Administered 2020-08-13: 1.5 mg via TRANSDERMAL

## 2020-08-13 MED ORDER — OXYCODONE HCL 5 MG PO TABS: ORAL_TABLET | ORAL | 0 refills | Status: AC

## 2020-08-13 MED ORDER — PROPOFOL 10 MG/ML IV BOLUS
INTRAVENOUS | Status: AC
  Filled 2020-08-13: qty 20

## 2020-08-13 MED ORDER — SCOPOLAMINE 1 MG/3DAYS TD PT72
MEDICATED_PATCH | TRANSDERMAL | Status: AC
  Filled 2020-08-13: qty 1

## 2020-08-13 MED ORDER — BUPIVACAINE LIPOSOME 1.3 % IJ SUSP
INTRAMUSCULAR | Status: DC | PRN
Start: 2020-08-13 — End: 2020-08-13
  Administered 2020-08-13: 10 mL via PERINEURAL

## 2020-08-13 MED ORDER — SUGAMMADEX SODIUM 500 MG/5ML IV SOLN
INTRAVENOUS | Status: DC | PRN
  Administered 2020-08-13: 450 mg via INTRAVENOUS

## 2020-08-13 MED ORDER — CEFAZOLIN SODIUM-DEXTROSE 2-4 GM/100ML-% IV SOLN
INTRAVENOUS | Status: AC
  Filled 2020-08-13: qty 100

## 2020-08-13 MED ORDER — ROCURONIUM BROMIDE 10 MG/ML (PF) SYRINGE
PREFILLED_SYRINGE | INTRAVENOUS | Status: AC
  Filled 2020-08-13: qty 10

## 2020-08-13 MED ORDER — ONDANSETRON HCL 4 MG PO TABS: 4.0000 mg | ORAL_TABLET | Freq: Three times a day (TID) | ORAL | 1 refills | Status: AC | PRN

## 2020-08-13 MED ORDER — FENTANYL CITRATE (PF) 100 MCG/2ML IJ SOLN
INTRAMUSCULAR | Status: DC | PRN
  Administered 2020-08-13: 100 ug via INTRAVENOUS

## 2020-08-13 MED ORDER — EPHEDRINE 5 MG/ML INJ
INTRAVENOUS | Status: AC
  Filled 2020-08-13: qty 10

## 2020-08-13 MED ORDER — ONDANSETRON HCL 4 MG/2ML IJ SOLN
INTRAMUSCULAR | Status: DC | PRN
  Administered 2020-08-13: 4 mg via INTRAVENOUS

## 2020-08-13 MED ORDER — MIDAZOLAM HCL 2 MG/2ML IJ SOLN
2.0000 mg | Freq: Once | INTRAMUSCULAR | Status: AC
  Administered 2020-08-13: 2 mg via INTRAVENOUS

## 2020-08-13 MED ORDER — ONDANSETRON HCL 4 MG/2ML IJ SOLN: 4.0000 mg | Freq: Once | INTRAMUSCULAR | Status: DC | PRN

## 2020-08-13 MED ORDER — DEXAMETHASONE SODIUM PHOSPHATE 10 MG/ML IJ SOLN
INTRAMUSCULAR | Status: AC
  Filled 2020-08-13: qty 1

## 2020-08-13 MED ORDER — CEFAZOLIN SODIUM-DEXTROSE 2-4 GM/100ML-% IV SOLN
2.0000 g | INTRAVENOUS | Status: AC
  Administered 2020-08-13: 2 g via INTRAVENOUS

## 2020-08-13 MED ORDER — PROPOFOL 500 MG/50ML IV EMUL
INTRAVENOUS | Status: DC | PRN
  Administered 2020-08-13: 25 ug/kg/min via INTRAVENOUS

## 2020-08-13 MED ORDER — ACETAMINOPHEN 500 MG PO TABS: 1000.0000 mg | ORAL_TABLET | Freq: Three times a day (TID) | ORAL | 0 refills | Status: AC

## 2020-08-13 MED ORDER — LIDOCAINE HCL (CARDIAC) PF 100 MG/5ML IV SOSY
PREFILLED_SYRINGE | INTRAVENOUS | Status: DC | PRN
  Administered 2020-08-13: 60 mg via INTRAVENOUS

## 2020-08-13 MED ORDER — PHENYLEPHRINE 40 MCG/ML (10ML) SYRINGE FOR IV PUSH (FOR BLOOD PRESSURE SUPPORT)
PREFILLED_SYRINGE | INTRAVENOUS | Status: AC
  Filled 2020-08-13: qty 10

## 2020-08-13 MED ORDER — FENTANYL CITRATE (PF) 100 MCG/2ML IJ SOLN
100.0000 ug | Freq: Once | INTRAMUSCULAR | Status: AC
  Administered 2020-08-13: 100 ug via INTRAVENOUS

## 2020-08-13 SURGICAL SUPPLY — 63 items
AID PSTN UNV HD RSTRNT DISP (MISCELLANEOUS) ×1
ANCHOR SUT 1.8 FBRTK KNTLS 2SU (Anchor) ×2 IMPLANT
APL PRP STRL LF DISP 70% ISPRP (MISCELLANEOUS) ×1
BLADE EXCALIBUR 4.0X13 (MISCELLANEOUS) ×2 IMPLANT
BLADE SURG 10 STRL SS (BLADE) IMPLANT
BURR OVAL 8 FLU 4.0X13 (MISCELLANEOUS) IMPLANT
CANNULA 5.75X71 LONG (CANNULA) IMPLANT
CANNULA PASSPORT 5 (CANNULA) IMPLANT
CANNULA PASSPORT BUTTON 10-40 (CANNULA) ×1 IMPLANT
CANNULA TWIST IN 8.25X7CM (CANNULA) IMPLANT
CHLORAPREP W/TINT 26 (MISCELLANEOUS) ×2 IMPLANT
CLSR STERI-STRIP ANTIMIC 1/2X4 (GAUZE/BANDAGES/DRESSINGS) ×2 IMPLANT
COOLER ICEMAN CLASSIC (MISCELLANEOUS) ×2 IMPLANT
COVER WAND RF STERILE (DRAPES) IMPLANT
DRAPE IMP U-DRAPE 54X76 (DRAPES) ×2 IMPLANT
DRAPE INCISE IOBAN 66X45 STRL (DRAPES) IMPLANT
DRAPE SHOULDER BEACH CHAIR (DRAPES) ×2 IMPLANT
DRSG PAD ABDOMINAL 8X10 ST (GAUZE/BANDAGES/DRESSINGS) ×2 IMPLANT
DW OUTFLOW CASSETTE/TUBE SET (MISCELLANEOUS) ×2 IMPLANT
GAUZE SPONGE 4X4 12PLY STRL (GAUZE/BANDAGES/DRESSINGS) ×2 IMPLANT
GLOVE BIO SURGEON STRL SZ 6.5 (GLOVE) ×2 IMPLANT
GLOVE BIOGEL PI IND STRL 7.5 (GLOVE) IMPLANT
GLOVE BIOGEL PI INDICATOR 7.5 (GLOVE) ×1
GLOVE ECLIPSE 8.0 STRL XLNG CF (GLOVE) ×2 IMPLANT
GLOVE SRG 8 PF TXTR STRL LF DI (GLOVE) ×1 IMPLANT
GLOVE SURG SYN 7.5  E (GLOVE) ×2
GLOVE SURG SYN 7.5 E (GLOVE) ×1 IMPLANT
GLOVE SURG SYN 7.5 PF PI (GLOVE) IMPLANT
GLOVE SURG UNDER POLY LF SZ6.5 (GLOVE) ×2 IMPLANT
GLOVE SURG UNDER POLY LF SZ8 (GLOVE) ×2
GOWN STRL REUS W/ TWL LRG LVL3 (GOWN DISPOSABLE) ×2 IMPLANT
GOWN STRL REUS W/TWL LRG LVL3 (GOWN DISPOSABLE) ×4
GOWN STRL REUS W/TWL XL LVL3 (GOWN DISPOSABLE) ×3 IMPLANT
KIT STABILIZATION SHOULDER (MISCELLANEOUS) ×1 IMPLANT
KIT STR SPEAR 1.8 FBRTK DISP (KITS) ×1 IMPLANT
LASSO CRESCENT QUICKPASS (SUTURE) IMPLANT
MANIFOLD NEPTUNE II (INSTRUMENTS) ×2 IMPLANT
NDL SAFETY ECLIPSE 18X1.5 (NEEDLE) ×1 IMPLANT
NDL SCORPION MULTI FIRE (NEEDLE) IMPLANT
NEEDLE HYPO 18GX1.5 SHARP (NEEDLE) ×2
NEEDLE SCORPION MULTI FIRE (NEEDLE) IMPLANT
PACK ARTHROSCOPY DSU (CUSTOM PROCEDURE TRAY) ×2 IMPLANT
PACK BASIN DAY SURGERY FS (CUSTOM PROCEDURE TRAY) ×2 IMPLANT
PAD COLD SHLDR WRAP-ON (PAD) ×2 IMPLANT
PORT APPOLLO RF 90DEGREE MULTI (SURGICAL WAND) ×2 IMPLANT
RESTRAINT HEAD UNIVERSAL NS (MISCELLANEOUS) ×2 IMPLANT
SHEET MEDIUM DRAPE 40X70 STRL (DRAPES) ×1 IMPLANT
SLEEVE SCD COMPRESS KNEE MED (MISCELLANEOUS) ×2 IMPLANT
SLING ARM FOAM STRAP LRG (SOFTGOODS) IMPLANT
SLING ARM IMMOBILIZER XL (CAST SUPPLIES) ×1 IMPLANT
SUT FIBERWIRE #2 38 T-5 BLUE (SUTURE)
SUT MNCRL AB 4-0 PS2 18 (SUTURE) ×2 IMPLANT
SUT PDS AB 1 CT  36 (SUTURE) ×2
SUT PDS AB 1 CT 36 (SUTURE) IMPLANT
SUT TIGER TAPE 7 IN WHITE (SUTURE) IMPLANT
SUTURE FIBERWR #2 38 T-5 BLUE (SUTURE) IMPLANT
SUTURE TAPE TIGERLINK 1.3MM BL (SUTURE) IMPLANT
SUTURETAPE TIGERLINK 1.3MM BL (SUTURE)
SYR 5ML LL (SYRINGE) ×2 IMPLANT
TAPE FIBER 2MM 7IN #2 BLUE (SUTURE) IMPLANT
TOWEL GREEN STERILE FF (TOWEL DISPOSABLE) ×4 IMPLANT
TUBE CONNECTING 20X1/4 (TUBING) ×2 IMPLANT
TUBING ARTHROSCOPY IRRIG 16FT (MISCELLANEOUS) ×2 IMPLANT

## 2020-08-13 NOTE — Anesthesia Procedure Notes (Signed)
Procedure Name: Intubation Date/Time: 08/13/2020 11:35 AM Performed by: Collier Bullock, CRNA Pre-anesthesia Checklist: Patient identified, Emergency Drugs available, Suction available and Patient being monitored Patient Re-evaluated:Patient Re-evaluated prior to induction Oxygen Delivery Method: Circle system utilized Preoxygenation: Pre-oxygenation with 100% oxygen Induction Type: IV induction Ventilation: Mask ventilation without difficulty Laryngoscope Size: Mac and 4 Grade View: Grade II Tube size: 7.0 mm Number of attempts: 1 Airway Equipment and Method: Stylet Placement Confirmation: ETT inserted through vocal cords under direct vision,  positive ETCO2 and breath sounds checked- equal and bilateral Secured at: 23 cm Tube secured with: Tape Dental Injury: Teeth and Oropharynx as per pre-operative assessment

## 2020-08-13 NOTE — Anesthesia Preprocedure Evaluation (Addendum)
Anesthesia Evaluation  Patient identified by MRN, date of birth, ID band  Reviewed: Allergy & Precautions, NPO status , Patient's Chart, lab work & pertinent test results  History of Anesthesia Complications (+) PONV, Family history of anesthesia reaction and history of anesthetic complications  Airway Mallampati: II  TM Distance: >3 FB Neck ROM: Full    Dental no notable dental hx. (+) Teeth Intact   Pulmonary sleep apnea and Continuous Positive Airway Pressure Ventilation ,    Pulmonary exam normal breath sounds clear to auscultation       Cardiovascular negative cardio ROS Normal cardiovascular exam Rhythm:Regular Rate:Normal     Neuro/Psych  Headaches, PSYCHIATRIC DISORDERS Anxiety Depression    GI/Hepatic Neg liver ROS, GERD  Controlled and Medicated,  Endo/Other  Morbid obesity  Renal/GU negative Renal ROS   Stress Urinary incontinence    Musculoskeletal  (+) Arthritis , Osteoarthritis,  Impingement syndrome Right shoulder Right Biceps tendonitis Primary OA right shoulder   Abdominal (+) + obese,   Peds  Hematology  (+) REFUSES BLOOD PRODUCTS,   Anesthesia Other Findings   Reproductive/Obstetrics                           Anesthesia Physical Anesthesia Plan  ASA: III  Anesthesia Plan: General   Post-op Pain Management:  Regional for Post-op pain   Induction: Intravenous  PONV Risk Score and Plan: 4 or greater and Scopolamine patch - Pre-op, Midazolam, Ondansetron, Treatment may vary due to age or medical condition and Dexamethasone  Airway Management Planned: Oral ETT  Additional Equipment:   Intra-op Plan:   Post-operative Plan: Extubation in OR  Informed Consent: I have reviewed the patients History and Physical, chart, labs and discussed the procedure including the risks, benefits and alternatives for the proposed anesthesia with the patient or authorized  representative who has indicated his/her understanding and acceptance.     Dental advisory given  Plan Discussed with: CRNA and Anesthesiologist  Anesthesia Plan Comments:         Anesthesia Quick Evaluation

## 2020-08-13 NOTE — Op Note (Signed)
Orthopaedic Surgery Operative Note (CSN: [id number])  Norma [name]  1971-02-24 Date of Surgery: 08/13/2020   Diagnoses:  RIGHT SHOULDER PRIMARY OSTEOARTHRITIS, IMPINGEMENT SYNDROME, BICIPITAL TENDINITIS  Procedure: Arthroscopic extensive debridement Arthroscopic subacromial decompression Arthroscopic biceps tenodesis Arthroscopic distal clavicle excision   Operative Finding Exam under anesthesia: Full motion no limitation, no instability Articular space: No loose bodies, capsule intact, significant anterior interval fraying and superior/posterior labral fraying. Chondral surfaces:Intact, no sign of chondral degeneration on the glenoid or humeral head Biceps: The anchor was reasonable however based on the patient's response to injection and continued symptoms biceps tendinitis we felt that it was appropriate to perform a tenodesis. She did have posterior superior labral fraying that did not destabilize the biceps anchor itself however was likely being irritated by her pole of the biceps. We debrided the labrum after performing a tenotomy for eventual tenodesis. Subscapularis: Normal Superior Cuff: Normal Bursal side: Normal cuff, type II acromion converted to type I.  Successful completion of the planned procedure. Overall the shoulder is as we expected. Routine biceps tenodesis performed. We will get her started early in therapy.   Post-operative plan: The patient will be non-weightbearing in a sling for 4 weeks with standard biceps tenodesis protocol.  The patient will be discharged home.  DVT prophylaxis not indicated in ambulatory upper extremity patient without known risk factors.   Pain control with PRN pain medication preferring oral medicines.  Follow up plan will be scheduled in approximately 7 days for incision check and XR.  Post-Op Diagnosis: Same Surgeons:Primary: Bjorn Pippin, MD Assistants:Caroline McBane PA-C Location: [facility] OR ROOM 6 Anesthesia: General with  Exparel interscalene block Antibiotics: Ancef 2 g Tourniquet time: None Estimated Blood Loss: Minimal Complications: None Specimens: None Implants: Implant Name Type Inv. Item Serial No. Manufacturer Lot No. LRB No. Used Action  ANCHOR SUT 1.8 FIBERTAK 2 SUT - [id number] Anchor ANCHOR SUT 1.8 FIBERTAK 2 SUT  [employer] [id number] Right 1 Implanted  ANCHOR SUT 1.8 FIBERTAK 2 SUT - [id number] Anchor ANCHOR SUT 1.8 FIBERTAK 2 SUT  [employer] [id number] Right 1 Implanted    Indications for Surgery:   Norma [name] is a 49 y.o. female with continued shoulder pain refractory to nonoperative measures for extended period of time. MRI demonstrated relatively intact rotator cuff, there was response to an injection that made [place] feel that she would benefit from biceps tenodesis. The risks and benefits were explained at length including but not limited to continued pain, cuff failure, biceps tenodesis failure, stiffness, need for further surgery and infection.   Procedure:   Patient was correctly identified in the preoperative holding area and operative site marked.  Patient brought to OR and positioned beachchair on an Downey table ensuring that all bony prominences were padded and the head was in an appropriate location.  Anesthesia was induced and the operative shoulder was prepped and draped in the usual sterile fashion.  Timeout was called preincision.  A standard posterior viewing portal was made after localizing the portal with a spinal needle.  An anterior accessory portal was also made.  After clearing the articular space the camera was positioned in the subacromial space.  Findings above.    Extensive debridement was performed of the anterior interval tissue, labral fraying and the bursa.  Subacromial decompression: We made a lateral portal with spinal needle guidance. We then proceeded to debride bursal tissue extensively with a shaver and arthrocare device. At that point we continued to  identify the borders of the acromion  and identify the spur. We then carefully preserved the deltoid fascia and used a burr to convert the acromion to a Type 1 flat acromion without issue.  Biceps tenodesis: We marked the tendon and then performed a tenotomy and debridement of the stump in the articular space. We then identified the biceps tendon in its groove suprapec with the arthroscope in the lateral portal taking care to move from lateral to medial to avoid injury to the subscapularis. At that point we unroofed the tendon itself and mobilized it. An accessory anterior portal was made in line with the tendon and we grasped it from the anterior superior portal and worked from the accessory anterior portal. Two Fibertak 1.59mm knotless anchors were placed in the groove and the tendon was secured in a luggage loop style fashion with a pass of the limb of suture through the tendon using a scorpion device to avoid pull-through.  Repair was completed with good tension on the tendon.  Residual stump of the tendon was removed after being resected with a RF ablator.  Distal Clavicle resection:  The scope was placed in the subacromial space from the posterior portal.  A hemostat was placed through the anterior portal and we spread at the [facility] joint.  A burr was then inserted and 10 mm of distal clavicle was resected taking care to avoid damage to the capsule around the joint and avoiding overhanging bone posteriorly.    The incisions were closed with absorbable monocryl and steri strips.  A sterile dressing was placed along with a sling. The patient was awoken from general anesthesia and taken to the PACU in stable condition without complication.   Alfonse Alpers, PA-C, present and scrubbed throughout the case, critical for completion in a timely fashion, and for retraction, instrumentation, closure.

## 2020-08-13 NOTE — Discharge Instructions (Signed)
  Post Anesthesia Home Care Instructions  Activity: Get plenty of rest for the remainder of the day. A responsible individual must stay with you for 24 hours following the procedure.  For the next 24 hours, DO NOT: -Drive a car -Operate machinery -Drink alcoholic beverages -Take any medication unless instructed by your physician -Make any legal decisions or sign important papers.  Meals: Start with liquid foods such as gelatin or soup. Progress to regular foods as tolerated. Avoid greasy, spicy, heavy foods. If nausea and/or vomiting occur, drink only clear liquids until the nausea and/or vomiting subsides. Call your physician if vomiting continues.  Special Instructions/Symptoms: Your throat may feel dry or sore from the anesthesia or the breathing tube placed in your throat during surgery. If this causes discomfort, gargle with warm salt water. The discomfort should disappear within 24 hours.  If you had a scopolamine patch placed behind your ear for the management of post- operative nausea and/or vomiting:  1. The medication in the patch is effective for 72 hours, after which it should be removed.  Wrap patch in a tissue and discard in the trash. Wash hands thoroughly with soap and water. 2. You may remove the patch earlier than 72 hours if you experience unpleasant side effects which may include dry mouth, dizziness or visual disturbances. 3. Avoid touching the patch. Wash your hands with soap and water after contact with the patch.    Information for Discharge Teaching: EXPAREL (bupivacaine liposome injectable suspension)   Your surgeon or anesthesiologist gave you EXPAREL(bupivacaine) to help control your pain after surgery.  EXPAREL is a local anesthetic that provides pain relief by numbing the tissue around the surgical site. EXPAREL is designed to release pain medication over time and can control pain for up to 72 hours. Depending on how you respond to EXPAREL, you may require  less pain medication during your recovery.  Possible side effects: Temporary loss of sensation or ability to move in the area where bupivacaine was injected. Nausea, vomiting, constipation Rarely, numbness and tingling in your mouth or lips, lightheadedness, or anxiety may occur. Call your doctor right away if you think you may be experiencing any of these sensations, or if you have other questions regarding possible side effects.  Follow all other discharge instructions given to you by your surgeon or nurse. Eat a healthy diet and drink plenty of water or other fluids.  If you return to the hospital for any reason within 96 hours following the administration of EXPAREL, it is important for health care providers to know that you have received this anesthetic. A teal colored band has been placed on your arm with the date, time and amount of EXPAREL you have received in order to alert and inform your health care providers. Please leave this armband in place for the full 96 hours following administration, and then you may remove the band.  

## 2020-08-13 NOTE — Transfer of Care (Signed)
Immediate Anesthesia Transfer of Care Note  Patient: Norma [name]  Procedure(s) Performed: SHOULDER ARTHROSCOPY WITH EXTENSIVE DEBRIDEMENT; DISTAL CLAVICULECTOMY (Right Shoulder) BICEPS TENODESIS (Right Shoulder)  Patient Location: PACU  Anesthesia Type:General and Regional  Level of Consciousness: awake and patient cooperative  Airway & Oxygen Therapy: Patient Spontanous Breathing and Patient connected to face mask oxygen  Post-op Assessment: Report given to RN, Post -op Vital signs reviewed and stable, Patient moving all extremities X 4 and Patient able to stick tongue midline  Post vital signs: Reviewed and stable  Last Vitals:  Vitals Value Taken Time  BP 141/103 08/13/20 1231  Temp    Pulse 103 08/13/20 1232  Resp 21 08/13/20 1232  SpO2 100 % 08/13/20 1232  Vitals shown include unvalidated device data.  Last Pain:  Vitals:   08/13/20 1007  TempSrc: Oral  PainSc:          Complications: No complications documented.

## 2020-08-13 NOTE — Interval H&P Note (Signed)
History and Physical Interval Note:  08/13/2020 10:35 AM  Norma [name]  has presented today for surgery, with the diagnosis of RIGHT SHOULDER PRIMARY OSTEOARTHRITIS, IMPINGEMENT SYNDROME, BICIPITAL TENDINITIS.  The various methods of treatment have been discussed with the patient and family. After consideration of risks, benefits and other options for treatment, the patient has consented to  Procedure(s): SHOULDER ARTHROSCOPY WITH EXTENSIVE DEBRIDEMENT; DISTAL CLAVICULECTOMY (Right) SHOULDER ARTHROSCOPY WITH SUBACROMIAL DECOMPRESSION PARTIAL ACROMIOPLASTY WITH CORACOACROMIAL RELEASE AND BICEP TENDODESIS (Right) as a surgical intervention.  The patient's history has been reviewed, patient examined, no change in status, stable for surgery.  I have reviewed the patient's chart and labs.  Questions were answered to the patient's satisfaction.     Bjorn Pippin

## 2020-08-13 NOTE — Progress Notes (Signed)
Assisted Dr. Foster with right, ultrasound guided, interscalene  block. Side rails up, monitors on throughout procedure. See vital signs in flow sheet. Tolerated Procedure well. 

## 2020-08-13 NOTE — Anesthesia Procedure Notes (Signed)
Anesthesia Regional Block: Interscalene brachial plexus block   Pre-Anesthetic Checklist: ,, timeout performed, Correct Patient, Correct Site, Correct Laterality, Correct Procedure, Correct Position, site marked, Risks and benefits discussed,  Surgical consent,  Pre-op evaluation,  At surgeon's request and post-op pain management  Laterality: Right  Prep: chloraprep       Needles:  Injection technique: Single-shot      Additional Needles:   Procedures:,,,, ultrasound used (permanent image in chart),,,,  Narrative:  Start time: 08/13/2020 10:27 AM End time: 08/13/2020 10:32 AM Injection made incrementally with aspirations every 5 mL.  Performed by: Personally  Anesthesiologist: Mal Amabile, MD  Additional Notes: Timeout performed. Patient sedated. Relevant anatomy ID'd using [place]. Incremental 2-18ml injection of LA with frequent aspiration. Patient tolerated procedure well.        Right Interscalene Block

## 2020-08-13 NOTE — Anesthesia Postprocedure Evaluation (Signed)
Anesthesia Post Note  Patient: Jelisha Weed  Procedure(s) Performed: SHOULDER ARTHROSCOPY WITH EXTENSIVE DEBRIDEMENT; DISTAL CLAVICULECTOMY (Right Shoulder) BICEPS TENODESIS (Right Shoulder)     Patient location during evaluation: PACU Anesthesia Type: General Level of consciousness: awake and alert and oriented Pain management: pain level controlled Vital Signs Assessment: post-procedure vital signs reviewed and stable Respiratory status: spontaneous breathing, nonlabored ventilation and respiratory function stable Cardiovascular status: blood pressure returned to baseline and stable Postop Assessment: no apparent nausea or vomiting Anesthetic complications: no   No complications documented.  Last Vitals:  Vitals:   08/13/20 1245 08/13/20 1300  BP: (!) 134/92 (!) 140/91  Pulse: 98 94  Resp: 16 16  Temp:    SpO2: 95% 98%    Last Pain:  Vitals:   08/13/20 1300  TempSrc:   PainSc: 0-No pain                 Raeshawn Tafolla A.

## 2020-08-14 ENCOUNTER — Encounter (HOSPITAL_BASED_OUTPATIENT_CLINIC_OR_DEPARTMENT_OTHER): Payer: Self-pay | Admitting: Orthopaedic Surgery

## 2020-08-14 NOTE — Addendum Note (Signed)
Addendum  created 08/14/20 1218 by Lorrin Nawrot, Jewel Baize, CRNA   Charge Capture section accepted

## 2020-09-05 ENCOUNTER — Other Ambulatory Visit: Payer: Self-pay | Admitting: Family Medicine

## 2020-09-17 ENCOUNTER — Encounter: Payer: No Typology Code available for payment source | Admitting: Family Medicine

## 2020-09-26 ENCOUNTER — Encounter (INDEPENDENT_AMBULATORY_CARE_PROVIDER_SITE_OTHER): Payer: Self-pay

## 2020-10-02 ENCOUNTER — Ambulatory Visit (INDEPENDENT_AMBULATORY_CARE_PROVIDER_SITE_OTHER): Payer: No Typology Code available for payment source | Admitting: Family Medicine

## 2020-10-02 ENCOUNTER — Other Ambulatory Visit: Payer: Self-pay

## 2020-10-02 ENCOUNTER — Encounter: Payer: Self-pay | Admitting: Family Medicine

## 2020-10-02 VITALS — BP 112/80 | HR 86 | Temp 98.3°F | Resp 17 | Ht 67.0 in | Wt 240.6 lb

## 2020-10-02 DIAGNOSIS — Z0000 Encounter for general adult medical examination without abnormal findings: Secondary | ICD-10-CM | POA: Diagnosis not present

## 2020-10-02 DIAGNOSIS — E669 Obesity, unspecified: Secondary | ICD-10-CM | POA: Diagnosis not present

## 2020-10-02 LAB — CBC WITH DIFFERENTIAL/PLATELET
Basophils Absolute: 0 10*3/uL (ref 0.0–0.1)
Basophils Relative: 0.5 % (ref 0.0–3.0)
Eosinophils Absolute: 0.1 10*3/uL (ref 0.0–0.7)
Eosinophils Relative: 2.2 % (ref 0.0–5.0)
HCT: 38.4 % (ref 36.0–46.0)
Hemoglobin: 12.7 g/dL (ref 12.0–15.0)
Lymphocytes Relative: 25.8 % (ref 12.0–46.0)
Lymphs Abs: 1.5 10*3/uL (ref 0.7–4.0)
MCHC: 33.1 g/dL (ref 30.0–36.0)
MCV: 83.9 fl (ref 78.0–100.0)
Monocytes Absolute: 0.2 10*3/uL (ref 0.1–1.0)
Monocytes Relative: 4 % (ref 3.0–12.0)
Neutro Abs: 3.9 10*3/uL (ref 1.4–7.7)
Neutrophils Relative %: 67.5 % (ref 43.0–77.0)
Platelets: 200 10*3/uL (ref 150.0–400.0)
RBC: 4.57 Mil/uL (ref 3.87–5.11)
RDW: 13.7 % (ref 11.5–15.5)
WBC: 5.8 10*3/uL (ref 4.0–10.5)

## 2020-10-02 LAB — LIPID PANEL
Cholesterol: 166 mg/dL (ref 0–200)
HDL: 56 mg/dL (ref >39.00)
LDL Cholesterol: 86 mg/dL (ref 0–99)
NonHDL: 110.38
Total CHOL/HDL Ratio: 3
Triglycerides: 123 mg/dL (ref 0.0–149.0)
VLDL: 24.6 mg/dL (ref 0.0–40.0)

## 2020-10-02 LAB — BASIC METABOLIC PANEL
BUN: 17 mg/dL (ref 6–23)
CO2: 32 mEq/L (ref 19–32)
Calcium: 9.2 mg/dL (ref 8.4–10.5)
Chloride: 103 mEq/L (ref 96–112)
Creatinine, Ser: 0.98 mg/dL (ref 0.40–1.20)
GFR: 68.74 mL/min (ref >60.00)
Glucose, Bld: 88 mg/dL (ref 70–99)
Potassium: 3.5 mEq/L (ref 3.5–5.1)
Sodium: 140 mEq/L (ref 135–145)

## 2020-10-02 LAB — VITAMIN D 25 HYDROXY (VIT D DEFICIENCY, FRACTURES): VITD: 12.83 ng/mL — ABNORMAL LOW (ref 30.00–100.00)

## 2020-10-02 LAB — HEPATIC FUNCTION PANEL
ALT: 17 U/L (ref 0–35)
AST: 14 U/L (ref 0–37)
Albumin: 4.5 g/dL (ref 3.5–5.2)
Alkaline Phosphatase: 92 U/L (ref 39–117)
Bilirubin, Direct: 0.1 mg/dL (ref 0.0–0.3)
Total Bilirubin: 0.5 mg/dL (ref 0.2–1.2)
Total Protein: 7.3 g/dL (ref 6.0–8.3)

## 2020-10-02 LAB — TSH: TSH: 0.7 u[IU]/mL (ref 0.35–4.50)

## 2020-10-02 NOTE — Patient Instructions (Addendum)
Follow up in 1 year or as needed We'll notify you of your lab results and make any changes if needed Continue to work on healthy diet and regular exercise- you're doing great!! Call [facility] and schedule pap and mammo Call with any questions or concerns Stay Safe! Stay Healthy! Happy Spring!!

## 2020-10-02 NOTE — Assessment & Plan Note (Signed)
Pt's PE WNL w/ exception of obesity.  She plans to schedule GYN appt w/ [facility].  UTD on Tdap, COVID.  Declines flu.  UTD on colonoscopy.  Check labs.  Anticipatory guidance provided.

## 2020-10-02 NOTE — Assessment & Plan Note (Signed)
Pt is down 22 lbs since last visit.  Has completely changed her eating habits.  Applauded her efforts.  Check labs to risk stratify.

## 2020-10-02 NOTE — Progress Notes (Signed)
   Subjective:    Patient ID: Norma [name], female    DOB: [birth year], 50 y.o.   MRN: [id number]  HPI CPE- UTD on pap, overdue for mammo (pt plans to call and schedule).  UTD on colonoscopy.  UTD on COVID, Tdap.  Declines flu  Reviewed past medical, surgical, family and social histories.   Patient Care Team    Relationship Specialty Notifications Start End  Sheliah Hatch, MD PCP - General Family Medicine  12/07/12   Jake Bathe, MD PCP - Cardiology Cardiology  11/07/18   Dara Lords, MD (Inactive) Consulting Physician Gynecology  03/09/19     Health Maintenance  Topic Date Due  . Hepatitis C Screening  Never done  . HIV Screening  Never done  . PAP SMEAR-Modifier  10/28/2020  . INFLUENZA VACCINE  11/27/2020 (Originally 03/30/2020)  . MAMMOGRAM  06/16/2021 (Originally 04/07/1991)  . COVID-19 Vaccine (3 - Booster for Pfizer series) 12/01/2020  . TETANUS/TDAP  09/13/2028  . COLONOSCOPY (Pts 45-81yrs Insurance coverage will need to be confirmed)  07/10/2029      Review of Systems Patient reports no vision/ hearing changes, adenopathy,fever, persistant/recurrent hoarseness , swallowing issues, chest pain, palpitations, edema, persistant/recurrent cough, hemoptysis, dyspnea (rest/exertional/paroxysmal nocturnal), gastrointestinal bleeding (melena, rectal bleeding), abdominal pain, significant heartburn, bowel changes, GU symptoms (dysuria, hematuria, incontinence), Gyn symptoms (abnormal  bleeding, pain),  syncope, focal weakness, memory loss, numbness & tingling, hair/nail changes, abnormal bruising or bleeding, anxiety, or depression.   +dry skin + weight loss- pt is down 22 lbs.  Has cut gluten, dairy, caffeine  This visit occurred during the SARS-CoV-2 public health emergency.  Safety protocols were in place, including screening questions prior to the visit, additional usage of staff PPE, and extensive cleaning of exam room while observing appropriate contact time as  indicated for disinfecting solutions.       Objective:   Physical Exam General Appearance:    Alert, cooperative, no distress, appears stated age  Head:    Normocephalic, without obvious abnormality, atraumatic  Eyes:    PERRL, conjunctiva/corneas clear, EOM's intact, fundi    benign, both eyes  Ears:    Normal TM's and external ear canals, both ears  Nose:   Deferred due to COVID  Throat:   Neck:   Supple, symmetrical, trachea midline, no adenopathy;    Thyroid: no enlargement/tenderness/nodules  Back:     Symmetric, no curvature, ROM normal, no CVA tenderness  Lungs:     Clear to auscultation bilaterally, respirations unlabored  Chest Wall:    No tenderness or deformity   Heart:    Regular rate and rhythm, S1 and S2 normal, no murmur, rub   or gallop  Breast Exam:    Deferred to GYN  Abdomen:     Soft, non-tender, bowel sounds active all four quadrants,    no masses, no organomegaly  Genitalia:    Deferred to GYN  Rectal:    Extremities:   Extremities normal, atraumatic, no cyanosis or edema  Pulses:   2+ and symmetric all extremities  Skin:   Skin color, texture, turgor normal, no rashes or lesions  Lymph nodes:   Cervical, supraclavicular, and axillary nodes normal  Neurologic:   CNII-XII intact, normal strength, sensation and reflexes    throughout          Assessment & Plan:

## 2020-10-03 ENCOUNTER — Other Ambulatory Visit: Payer: Self-pay

## 2020-10-03 DIAGNOSIS — E559 Vitamin D deficiency, unspecified: Secondary | ICD-10-CM

## 2020-10-03 MED ORDER — VITAMIN D (ERGOCALCIFEROL) 1.25 MG (50000 UNIT) PO CAPS: 50000.0000 [IU] | ORAL_CAPSULE | ORAL | 0 refills | Status: DC

## 2020-10-09 ENCOUNTER — Other Ambulatory Visit: Payer: Self-pay | Admitting: Family Medicine

## 2020-10-09 NOTE — Telephone Encounter (Signed)
Requesting:KLONOPIN 1MG  Contract: UDS: Last Visit:10/02/20 Next Visit:N/A Last Refill:12/28/19 30 TABS 3 REFILLS Please Advise

## 2020-10-10 ENCOUNTER — Other Ambulatory Visit: Payer: Self-pay | Admitting: Gastroenterology

## 2020-10-10 DIAGNOSIS — K2970 Gastritis, unspecified, without bleeding: Secondary | ICD-10-CM

## 2021-01-08 ENCOUNTER — Other Ambulatory Visit: Payer: Self-pay | Admitting: Gastroenterology

## 2021-01-08 DIAGNOSIS — K2970 Gastritis, unspecified, without bleeding: Secondary | ICD-10-CM

## 2021-02-10 ENCOUNTER — Other Ambulatory Visit: Payer: Self-pay | Admitting: Gastroenterology

## 2021-02-10 DIAGNOSIS — K2970 Gastritis, unspecified, without bleeding: Secondary | ICD-10-CM

## 2021-02-25 ENCOUNTER — Encounter: Payer: Self-pay | Admitting: *Deleted

## 2021-03-03 ENCOUNTER — Other Ambulatory Visit: Payer: Self-pay | Admitting: Gastroenterology

## 2021-03-03 ENCOUNTER — Other Ambulatory Visit: Payer: Self-pay | Admitting: Family Medicine

## 2021-03-03 DIAGNOSIS — K2970 Gastritis, unspecified, without bleeding: Secondary | ICD-10-CM

## 2021-05-06 ENCOUNTER — Encounter: Payer: Self-pay | Admitting: Registered Nurse

## 2021-05-06 ENCOUNTER — Ambulatory Visit: Payer: No Typology Code available for payment source | Admitting: Registered Nurse

## 2021-05-06 ENCOUNTER — Other Ambulatory Visit: Payer: Self-pay

## 2021-05-06 VITALS — BP 130/84 | HR 77 | Temp 98.1°F | Resp 17 | Wt 239.8 lb

## 2021-05-06 DIAGNOSIS — F418 Other specified anxiety disorders: Secondary | ICD-10-CM | POA: Diagnosis not present

## 2021-05-06 MED ORDER — HYDROXYZINE HCL 10 MG PO TABS: 10.0000 mg | ORAL_TABLET | Freq: Three times a day (TID) | ORAL | 2 refills | Status: DC | PRN

## 2021-05-06 MED ORDER — CLONAZEPAM 1 MG PO TABS: 1.0000 mg | ORAL_TABLET | Freq: Every evening | ORAL | 3 refills | Status: DC | PRN

## 2021-05-06 NOTE — Progress Notes (Signed)
Established Patient Office Visit  Subjective:  Patient ID: Norma [name], female    DOB: November 13, 1970  Age: 50 y.o. MRN: [id number]  CC:  Chief Complaint  Patient presents with   Follow-up    Med recheck    HPI Norma [name] presents for med check  Anxiety and depression Has felt more anxious lately - a lot of changes at home, in particular at times having a hard time with her special needs child Feels that it's hard to pull herself out of the spiral of anxiety Currently taking venlafaxine 150mg  XL PO qd, bupropion 300mg  XL PO qd, and clonazepam 1 mg po qhs PRN for sleep (though PDMP consulted and showing no dispense of clonazepam). States last time she took clonazepam was a number of months ago. This was very effective for sleep and restless legs. Wants to resume  Otherwise interested in prn during the day   Past Medical History:  Diagnosis Date   Anxiety    ASCUS (atypical squamous cells of undetermined significance) on Pap smear 07   NEG HPV; BENIGN CHANGES 4/08   Chronic back pain    Claustrophobia    with mask on   Esophagitis    Esophagitis    Family history of adverse reaction to anesthesia    daughter 08 low blood pressure during ear tube surgery 2 years ago   Family history of hypertrophic cardiomyopathy    mother   GERD (gastroesophageal reflux disease)    Headache    hx migraines once a year or so now   Internal hemorrhoids    PONV (postoperative nausea and vomiting)    Sleep apnea    C PAP   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   Urinary incontinence    Stress incontinence    Past Surgical History:  Procedure Laterality Date   BICEPT TENODESIS Right 08/13/2020   Procedure: BICEPS TENODESIS;  Surgeon: Aundra Millet, MD;  Location: [facility];  Service: Orthopedics;  Laterality: Right;   BILATERAL FUSION  1992   age 45--Foot   CHOLECYSTECTOMY N/A 08/13/2016   Procedure: LAPAROSCOPIC CHOLECYSTECTOMY  WITH INTRAOPERATIVE CHOLANGIOGRAM;  Surgeon: Bjorn Pippin, MD;  Location: [facility];  Service: General;  Laterality: N/A;   deviated septum Bilateral 12/2019   GALLBLADDER SURGERY     INTRAUTERINE DEVICE INSERTION  07/2015   Mirena   INTRAUTERINE DEVICE INSERTION  05/19/2017   Mirena   LAPAROSCOPIC ABDOMINAL EXPLORATION  08/13/2016   Procedure: LAPAROSCOPIC  SMALL BOWEL EXPLORATION;  Surgeon: 05/21/2017, MD;  Location: 08/15/2016 [facility];  Service: General;;   mirena removed  spring 2017   NASAL SEPTOPLASTY W/ TURBINOPLASTY Bilateral 01/02/2020   Procedure: NASAL SEPTOPLASTY WITH TURBINATE REDUCTION;  Surgeon: 07-19-2002, MD;  Location: [facility];  Service: ENT;  Laterality: Bilateral;   SHOULDER ARTHROSCOPY WITH DISTAL CLAVICLE RESECTION Right 08/13/2020   Procedure: SHOULDER ARTHROSCOPY WITH EXTENSIVE DEBRIDEMENT; DISTAL CLAVICULECTOMY;  Surgeon: [facility], MD;  Location: [facility];  Service: Orthopedics;  Laterality: Right;   TENDON REPAIR Right    repair in right shoulder   TONSILECTOMY, ADENOIDECTOMY, BILATERAL MYRINGOTOMY AND TUBES Bilateral 12/2019   TONSILLECTOMY  01/02/2020   TONSILLECTOMY Bilateral 01/02/2020   Procedure: TONSILLECTOMY;  Surgeon: 03/03/2020, MD;  Location: [facility] OR;  Service: ENT;  Laterality: Bilateral;    Family History  Problem Relation Age of Onset   Hypertension Maternal Grandfather    Heart disease Maternal Grandfather  Hypertension Paternal Grandmother    Heart disease Paternal Grandmother    Hypertension Paternal Grandfather    Heart disease Paternal Grandfather    Depression Father    Heart failure Maternal Grandmother    Cardiomyopathy Mother     Social History   Socioeconomic History   Marital status: Married    Spouse name: Not on file   Number of children: Not on file   Years of education: Not on file   Highest education level: Not on file  Occupational History   Not on file  Tobacco Use   Smoking status: Never    Smokeless tobacco: Never  Vaping Use   Vaping Use: Never used  Substance and Sexual Activity   Alcohol use: Yes    Alcohol/week: 0.0 standard drinks    Comment: SOCIALLY; Rare   Drug use: No   Sexual activity: Yes    Birth control/protection: I.U.D.    Comment: 1st intercourse 50 yo-Fewer than 5 partners Mirena IUD 05/19/2017  Other Topics Concern   Not on file  Social History Narrative   Not on file   Social Determinants of Health   Financial Resource Strain: Not on file  Food Insecurity: Not on file  Transportation Needs: Not on file  Physical Activity: Not on file  Stress: Not on file  Social Connections: Not on file  Intimate Partner Violence: Not on file    Outpatient Medications Prior to Visit  Medication Sig Dispense Refill   buPROPion (WELLBUTRIN XL) 300 MG 24 hr tablet TAKE 1 TABLET BY MOUTH EVERY DAY 90 tablet 2   calcium carbonate (OS-CAL) 1250 (500 Ca) MG chewable tablet Chew by mouth.     cetirizine (ZYRTEC) 10 MG tablet Take 10 mg by mouth daily.     ibuprofen (ADVIL,MOTRIN) 200 MG tablet Take 800 mg by mouth every 6 (six) hours as needed for moderate pain.     levonorgestrel (MIRENA) 20 MCG/24HR IUD 1 each by Intrauterine route once.     omeprazole (PRILOSEC) 40 MG capsule TAKE 1 CAPSULE BY MOUTH TWICE A DAY BEST TO TAKE ON EMPTY STOMACH 20-30 MINUTES BEFORE FOOD 60 capsule 5   triamcinolone ointment (KENALOG) 0.1 % APPLY TO AFFECTED AREA TWICE A DAY 90 g 1   venlafaxine XR (EFFEXOR-XR) 150 MG 24 hr capsule TAKE ONE CAPSULE EVERY DAY WITH BREAKFAST. 90 capsule 1   clonazePAM (KLONOPIN) 1 MG tablet TAKE 1 TABLET (1 MG TOTAL) BY MOUTH AT BEDTIME AS NEEDED FOR ANXIETY. 30 tablet 3   Vitamin D, Ergocalciferol, (DRISDOL) 1.25 MG (50000 UNIT) CAPS capsule Take 1 capsule (50,000 Units total) by mouth every 7 (seven) days. (Patient not taking: Reported on 05/06/2021) 12 capsule 0   No facility-administered medications prior to visit.    Allergies  Allergen Reactions    Depakote [Divalproex Sodium] Other (See Comments)    blisters   Codeine Hives    ROS Review of Systems  Constitutional: Negative.   HENT: Negative.    Eyes: Negative.   Respiratory: Negative.    Cardiovascular: Negative.   Gastrointestinal: Negative.   Genitourinary: Negative.   Musculoskeletal: Negative.   Skin: Negative.   Neurological: Negative.   Psychiatric/Behavioral: Negative.    All other systems reviewed and are negative.    Objective:    Physical Exam Vitals and nursing note reviewed.  Constitutional:      General: She is not in acute distress.    Appearance: Normal appearance. She is normal weight. She is not ill-appearing, toxic-appearing  or diaphoretic.  Cardiovascular:     Rate and Rhythm: Normal rate and regular rhythm.     Heart sounds: Normal heart sounds. No murmur heard.   No friction rub. No gallop.  Pulmonary:     Effort: Pulmonary effort is normal. No respiratory distress.     Breath sounds: Normal breath sounds. No stridor. No wheezing, rhonchi or rales.  Chest:     Chest wall: No tenderness.  Skin:    General: Skin is warm and dry.  Neurological:     General: No focal deficit present.     Mental Status: She is alert and oriented to person, place, and time. Mental status is at baseline.  Psychiatric:        Mood and Affect: Mood normal.        Behavior: Behavior normal.        Thought Content: Thought content normal.        Judgment: Judgment normal.    BP 130/84   Pulse 77   Temp 98.1 F (36.7 C) (Temporal)   Resp 17   Wt 239 lb 12.8 oz (108.8 kg)   SpO2 97%   BMI 37.56 kg/m  Wt Readings from Last 3 Encounters:  05/06/21 239 lb 12.8 oz (108.8 kg)  10/02/20 240 lb 9.6 oz (109.1 kg)  08/13/20 247 lb 2.2 oz (112.1 kg)     Health Maintenance Due  Topic Date Due   PAP SMEAR-Modifier  10/28/2020   COVID-19 Vaccine (3 - Booster for Pfizer series) 10/31/2020   INFLUENZA VACCINE  03/30/2021    There are no preventive care  reminders to display for this patient.  Lab Results  Component Value Date   TSH 0.70 10/02/2020   Lab Results  Component Value Date   WBC 5.8 10/02/2020   HGB 12.7 10/02/2020   HCT 38.4 10/02/2020   MCV 83.9 10/02/2020   PLT 200.0 10/02/2020   Lab Results  Component Value Date   NA 140 10/02/2020   K 3.5 10/02/2020   CO2 32 10/02/2020   GLUCOSE 88 10/02/2020   BUN 17 10/02/2020   CREATININE 0.98 10/02/2020   BILITOT 0.5 10/02/2020   ALKPHOS 92 10/02/2020   AST 14 10/02/2020   ALT 17 10/02/2020   PROT 7.3 10/02/2020   ALBUMIN 4.5 10/02/2020   CALCIUM 9.2 10/02/2020   ANIONGAP 8 08/10/2016   GFR 68.74 10/02/2020   Lab Results  Component Value Date   CHOL 166 10/02/2020   Lab Results  Component Value Date   HDL 56.00 10/02/2020   Lab Results  Component Value Date   LDLCALC 86 10/02/2020   Lab Results  Component Value Date   TRIG 123.0 10/02/2020   Lab Results  Component Value Date   CHOLHDL 3 10/02/2020   Lab Results  Component Value Date   HGBA1C 5.9 08/11/2018      Assessment & Plan:   Problem List Items Addressed This Visit       Other   Depression with anxiety - Primary   Relevant Medications   clonazePAM (KLONOPIN) 1 MG tablet   hydrOXYzine (ATARAX/VISTARIL) 10 MG tablet    Meds ordered this encounter  Medications   clonazePAM (KLONOPIN) 1 MG tablet    Sig: Take 1 tablet (1 mg total) by mouth at bedtime as needed for anxiety.    Dispense:  30 tablet    Refill:  3    Order Specific Question:   Supervising Provider    Answer:  GREENE, JEFFREY R [[id number]]   hydrOXYzine (ATARAX/VISTARIL) 10 MG tablet    Sig: Take 1 tablet (10 mg total) by mouth 3 (three) times daily as needed.    Dispense:  30 tablet    Refill:  2    Order Specific Question:   Supervising Provider    Answer:   Neva Seat, JEFFREY R [[id number]]    Follow-up: Return in about 4 weeks (around 06/03/2021) for med check - with PCP.   PLAN Start hydroxyzine 5-10mg  po tid PRN for  anxiety. Discussed risks, benefits, and alternatives to this, including increasing frequency of clonazepam.  Will maintain current doses of effexor and wellbutrin, though discussed going up on either or replacing one of these with another SSRI, SNRI, or second gen antipsychotic.  Return in 4-6 weeks for med check with PCP Commended patient on weight loss - she is down nearly 25 lbs in the past year through healthy diet changes. Patient encouraged to call clinic with any questions, comments, or concerns.  Janeece Agee, NP

## 2021-05-06 NOTE — Patient Instructions (Addendum)
Ms. Norma, [name] to meet you  [name] work on diet - no notes! Keep it up. It's working.  Start hydroxyzine 5-10mg  by mouth three times daily as needed for anxiety. May be mildly sedative.  Refilled clonazepam for bedtime use.  Can consider increasing wellbutrin or effexor in the future.   Can consider switching wellbutrin out for another SSRI/SNRI or even something like abilify.  Can consider swapping hydroxyzine for something like buspar   Check in with Dr. Beverely Low in 4-6 weeks  Thank you  [name]

## 2021-06-15 ENCOUNTER — Encounter: Payer: Self-pay | Admitting: Family Medicine

## 2021-06-15 ENCOUNTER — Ambulatory Visit: Payer: No Typology Code available for payment source | Admitting: Family Medicine

## 2021-06-15 ENCOUNTER — Other Ambulatory Visit: Payer: Self-pay

## 2021-06-15 VITALS — BP 118/76 | HR 66 | Temp 98.2°F | Resp 16 | Wt 244.6 lb

## 2021-06-15 DIAGNOSIS — F418 Other specified anxiety disorders: Secondary | ICD-10-CM | POA: Diagnosis not present

## 2021-06-15 MED ORDER — BUSPIRONE HCL 7.5 MG PO TABS: 7.5000 mg | ORAL_TABLET | Freq: Two times a day (BID) | ORAL | 3 refills | Status: DC

## 2021-06-15 NOTE — Patient Instructions (Signed)
Follow up in 3-4 weeks to recheck anxiety START the Buspirone (Buspar) twice daily- we can go up as needed CONTINUE your current medications RESTART counseling- you deserve a place to unpack all of this Call with any questions or concerns Hang in there!!!

## 2021-06-15 NOTE — Progress Notes (Signed)
   Subjective:    Patient ID: Norma [name], female    DOB: 08-Jul-1971, 50 y.o.   MRN: [id number]  HPI Depression/Anxiety- pt was seen on 9/7 and started on Hydroxyzine in addition to her Wellbutrin and Venlafaxine.  Hydroxyzine was too sedating.  Just received an autism dx for daughter Aundra Millet) in addition to downs.  Older daughter (Maddie) just started HS.  Doing well.  Husband is traveling constantly for work.  Some drama/tension w/in friend group.  Not speaking w/ stepdad due to issues w/ mom's estate.  'the craziness just keeps going'.  Very anxious.  Has a hard time getting it under control.  Would rather sleep than do anything else.   Review of Systems For ROS see HPI   This visit occurred during the SARS-CoV-2 public health emergency.  Safety protocols were in place, including screening questions prior to the visit, additional usage of staff PPE, and extensive cleaning of exam room while observing appropriate contact time as indicated for disinfecting solutions.      Objective:   Physical Exam Vitals reviewed.  Constitutional:      General: She is not in acute distress.    Appearance: Normal appearance. She is obese. She is not ill-appearing.  HENT:     Head: Normocephalic and atraumatic.  Eyes:     Extraocular Movements: Extraocular movements intact.     Conjunctiva/sclera: Conjunctivae normal.     Pupils: Pupils are equal, round, and reactive to light.  Skin:    General: Skin is warm and dry.  Neurological:     General: No focal deficit present.     Mental Status: She is alert and oriented to person, place, and time.  Psychiatric:        Mood and Affect: Mood normal.        Behavior: Behavior normal.        Thought Content: Thought content normal.          Assessment & Plan:

## 2021-06-27 NOTE — Assessment & Plan Note (Signed)
Deteriorated.  Pt is intolerant of hydroxyzine due to excessive fatigue.  Will start low dose Buspar in addition to Wellbutrin and Venlafaxine.  Encouraged her to start counseling for work through a lot of her feelings- both past and present.  Pt agreeable to this.  Will follow.

## 2021-07-03 ENCOUNTER — Ambulatory Visit: Payer: No Typology Code available for payment source | Admitting: Gastroenterology

## 2021-07-03 ENCOUNTER — Encounter: Payer: Self-pay | Admitting: Gastroenterology

## 2021-07-03 VITALS — BP 128/84 | HR 81 | Ht 67.0 in | Wt 243.4 lb

## 2021-07-03 DIAGNOSIS — K2970 Gastritis, unspecified, without bleeding: Secondary | ICD-10-CM | POA: Diagnosis not present

## 2021-07-03 DIAGNOSIS — K219 Gastro-esophageal reflux disease without esophagitis: Secondary | ICD-10-CM | POA: Diagnosis not present

## 2021-07-03 MED ORDER — DEXLANSOPRAZOLE 60 MG PO CPDR: 60.0000 mg | DELAYED_RELEASE_CAPSULE | Freq: Every day | ORAL | 3 refills | Status: DC

## 2021-07-03 MED ORDER — FAMOTIDINE 20 MG PO TABS: 20.0000 mg | ORAL_TABLET | Freq: Two times a day (BID) | ORAL | 3 refills | Status: DC

## 2021-07-03 NOTE — Progress Notes (Signed)
Referring Provider: Sheliah Hatchabori, Katherine E, MD Primary Care Physician:  Sheliah Hatchabori, Katherine E, MD   Chief complaint: reflux despite treatment   IMPRESSION:  LA Class C Reflux with esophageal ulceration on EGD 07/11/19    - persistent dysphagia, coughing and now brash despite omeprazole 40 mg BID and famotidine BID IBS with alternating bowel habits    - normal colonoscopy 07/11/19 including negative biopsies for microscopic colitis    - normal duodenal biopsies 07/11/19    - well controlled with dicyclomine PRN    - previously using colestipol Prior cholecystectomy 2017 for chronic cholecystitis BMI 38 Fatty liver on ultrasound 2017 Family history of autoimmune disease (mother with psoriasis) Colonoscopy  2003 ([facility]) and 2020 No known family history of colon cancer or polyps  Extensive esophagitis with esophageal ulceration without associated symptoms except for persist dysphonia despite months of PPI BID and addition of famotidine 20 mg BID. She feels like she may due better on a higher dose of omeprazole. Will switch to Dexilant. Continue famotidine BID. Given the right sided neck pain, I have recommended referral to ENT for further evaluation.  Low threshold to repeat EGD if no response in switch to dexilant.   Altered bowel habits for several years.  Likely IBS given the severe urgency with associated abdominal cramping, change in stool consistency that alternates between explosive watery, loose and gloppy, and constipation. No alarm features.  Continue dicyclomine PRN. She is no longer needing colestipol. Add Benefiber and consider Xifaxan if symptoms persist/worsen.   Plan: - Switch Omeprazole 40 mg BID to Dexilant 60 mg daily - Continue famotidine 20 mg BID (refilled) - I asked her to follow-up her ENT given the right neck symptoms - Continue Dicyclomine 20 mg QID PRN - Consider repeat EGD +/- 24 hour pH probe monitoring if not responding - Follow-up in 2-3 months, earlier if  needed - Colonoscopy due 2030  HPI: Norma [name] is a 50 y.o. female  who returns for annual visit with ongoing symptoms of reflux. The interval history is obtained through the patient. She has anxiety, depression, headaches, sleep apnea, and obesity.  She had a cholecystectomy in 2017 for mild chronic cholecystitis.  Previously seen by Dr. Loreta AveMann in 2003 for abdominal pain and constipation. Diagnosed with IBS with altered bowel habits, intermittent severe urge to defecate, severe straining with severe abdominal cramping while trying to defecate that occurs a few times each week, and ultimately diarrhea.   At the time of endoscopic evaluation with me in 2020 her EGD showed  LA Grade C reflux esophagitis with ulceration. Multiple gastric erosions in the antrum. H pylori negative gastropathy on biopsies. No intestinal metaplasia. Normal duodenum was normal. Duodenal biopsies were normal.   At the time of follow-up 09/13/19 she reported using fewer TUMS on omeprazole 40 mg BID. No additional choking episodes but her voice remained raspy, she continued to need to frequently clear her throat, and complained of persistent daily sore throat.  No heartburn, dysphagia, or odynophagia.  Colestipol improved her diarrhea and abdominal pain.  Also had significant post-nasal drip. Saw Dr. Annalee GentaShoemaker with ENT. Had tonsillectomy and nasal septoplasy with turbinate reducation 01/02/20 for nasal septal deviation and tonsillar hypertrophy.Less sore throat and no further snoring following surgery.    At time of follow-up 02/22/20 she reported feeling better since using omeprazole BID. No longer requiring TUMS.  Continued to have some urgency with associated cramping, although improved since the colestipol. Having 3 BM daily that are primarily loose and  often watery. Mostly like soft serve ice cream consistency. Has changed her diet to avoid spicy, greasy foods. No blood or mucous. No alarm features.   At the time of  follow-up 06/09/20.  IBS is better. Diarrhea is somewhat improved on colestipol 2g BID and dicyclomine before meals. Notices immediate recurrent of symptoms if she misses a dose. Having 2 BM daily. Stools have fluctuating consistency but are less soft and more formed than before.  Less afraid to eat out because of the improvement. Has idnetified Diet Coke as a trigger.   Returns today in follow-up. No change in reflux with omeprazole 40 mg BID. Continues to have dysphonia and frequent throat clearing but no true heartburn. Now with some brash that is more pronounced on the right side. No odynophagia, dysphagia, or globus. No blood in the stool or abdominal pain.   Will use dicyclomine PRN prior to meals eating out. Is not requiring colestipol.   Labs 10/02/20: normal CBC, normal TSH   Prior abdominal imaging: Abd ultrasound 06/12/16: hepatic steatosis CT abd/pel with contrast 06/12/16:  No acute findings. Specifically, no findings to explain the patient's history of right upper quadrant pain. Very short segment jejunal enteroenteric intussusception without wall thickening, obstruction, or perienteric edema/inflammation. This is most likely a transient process. HIDA with CCK 07/16/16: GBEF 10%. Patent cystic duct and common bile duct.  Endoscopic history: - Colonoscopy with Dr. Collene Mares in 2003:  Normal - EGD 07/11/19:  LA Grade C reflux esophagitis with ulceration. Multiple gastric erosions in the antrum. H pylori negative gastropathy on biopsies. No intestinal metaplasia. Normal duodenum was normal. Duodenal biopsies were normal.  - Colonoscopy 07/11/19: Normal.  Normal right sided and left sided colon biopsies.    Past Medical History:  Diagnosis Date   Anxiety    ASCUS (atypical squamous cells of undetermined significance) on Pap smear 07   NEG HPV; BENIGN CHANGES 4/08   Chronic back pain    Claustrophobia    with mask on   Esophagitis    Esophagitis    Family history of adverse reaction  to anesthesia    daughter Jinny Blossom low blood pressure during ear tube surgery 2 years ago   Family history of hypertrophic cardiomyopathy    mother   GERD (gastroesophageal reflux disease)    Headache    hx migraines once a year or so now   Internal hemorrhoids    PONV (postoperative nausea and vomiting)    Sleep apnea    C PAP   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   Urinary incontinence    Stress incontinence    Past Surgical History:  Procedure Laterality Date   BICEPT TENODESIS Right 08/13/2020   Procedure: BICEPS TENODESIS;  Surgeon: Hiram Gash, MD;  Location: [place];  Service: Orthopedics;  Laterality: Right;   BILATERAL FUSION  1992   age 59--Foot   CHOLECYSTECTOMY N/A 08/13/2016   Procedure: LAPAROSCOPIC CHOLECYSTECTOMY WITH INTRAOPERATIVE CHOLANGIOGRAM;  Surgeon: Jackolyn Confer, MD;  Location: [facility];  Service: General;  Laterality: N/A;   deviated septum Bilateral 12/2019   GALLBLADDER SURGERY     INTRAUTERINE DEVICE INSERTION  07/2015   Mirena   INTRAUTERINE DEVICE INSERTION  05/19/2017   Mirena   LAPAROSCOPIC ABDOMINAL EXPLORATION  08/13/2016   Procedure: LAPAROSCOPIC  SMALL BOWEL EXPLORATION;  Surgeon: Jackolyn Confer, MD;  Location: [facility];  Service: General;;   mirena removed  spring 2017   NASAL SEPTOPLASTY W/  TURBINOPLASTY Bilateral 01/02/2020   Procedure: NASAL SEPTOPLASTY WITH TURBINATE REDUCTION;  Surgeon: Osborn Coho, MD;  Location: [facility];  Service: ENT;  Laterality: Bilateral;   SHOULDER ARTHROSCOPY WITH DISTAL CLAVICLE RESECTION Right 08/13/2020   Procedure: SHOULDER ARTHROSCOPY WITH EXTENSIVE DEBRIDEMENT; DISTAL CLAVICULECTOMY;  Surgeon: Bjorn Pippin, MD;  [facility];  Service: Orthopedics;  Laterality: Right;   TENDON REPAIR Right    repair in right shoulder   TONSILECTOMY, ADENOIDECTOMY, BILATERAL MYRINGOTOMY AND TUBES Bilateral 12/2019   TONSILLECTOMY  01/02/2020   TONSILLECTOMY  Bilateral 01/02/2020   Procedure: TONSILLECTOMY;  Surgeon: Osborn Coho, MD;  Location: [facility];  Service: ENT;  Laterality: Bilateral;    Current Outpatient Medications  Medication Sig Dispense Refill   buPROPion (WELLBUTRIN XL) 300 MG 24 hr tablet TAKE 1 TABLET BY MOUTH EVERY DAY 90 tablet 2   busPIRone (BUSPAR) 7.5 MG tablet Take 1 tablet (7.5 mg total) by mouth 2 (two) times daily. 60 tablet 3   cetirizine (ZYRTEC) 10 MG tablet Take 10 mg by mouth daily.     clonazePAM (KLONOPIN) 1 MG tablet Take 1 tablet (1 mg total) by mouth at bedtime as needed for anxiety. 30 tablet 3   ibuprofen (ADVIL,MOTRIN) 200 MG tablet Take 800 mg by mouth every 6 (six) hours as needed for moderate pain.     levonorgestrel (MIRENA) 20 MCG/24HR IUD 1 each by Intrauterine route once.     omeprazole (PRILOSEC) 40 MG capsule TAKE 1 CAPSULE BY MOUTH TWICE A DAY BEST TO TAKE ON EMPTY STOMACH 20-30 MINUTES BEFORE FOOD 60 capsule 5   triamcinolone ointment (KENALOG) 0.1 % APPLY TO AFFECTED AREA TWICE A DAY 90 g 1   venlafaxine XR (EFFEXOR-XR) 150 MG 24 hr capsule TAKE ONE CAPSULE EVERY DAY WITH BREAKFAST. 90 capsule 1   Vitamin D, Ergocalciferol, (DRISDOL) 1.25 MG (50000 UNIT) CAPS capsule Take 1 capsule (50,000 Units total) by mouth every 7 (seven) days. 12 capsule 0   No current facility-administered medications for this visit.    Allergies as of 07/03/2021 - Review Complete 07/03/2021  Allergen Reaction Noted   Depakote [divalproex sodium] Other (See Comments) 01/03/2013   Codeine Hives 11/22/2008    Family History  Problem Relation Age of Onset   Hypertension Maternal Grandfather    Heart disease Maternal Grandfather    Hypertension Paternal Grandmother    Heart disease Paternal Grandmother    Hypertension Paternal Grandfather    Heart disease Paternal Grandfather    Depression Father    Heart failure Maternal Grandmother    Cardiomyopathy Mother      Physical Exam: General:   Alert,   well-nourished, pleasant and cooperative in NAD. Continues to have a raspy voice that does not sound changed since her last visit.  Head:  Normocephalic and atraumatic. Abdomen:  Soft, nontender, nondistended, normal bowel sounds, no rebound or guarding. No hepatosplenomegaly.   Neurologic:  Alert and  oriented x4;  grossly nonfocal Skin:  Intact without significant lesions or rashes. Psych:  Alert and cooperative. Normal mood and affect.      Amariyon Maynes L. Orvan Falconer, MD, MPH 07/03/2021, 9:00 AM

## 2021-07-03 NOTE — Patient Instructions (Addendum)
It was my pleasure to provide care to you today. Based on our discussion, I am providing you with my recommendations below:  RECOMMENDATION(S):   You may continue omeprazole along with the Famotidine UNTIL we are able to complete the PA for Dexilant Start Dexilant 60 mg daily ONCE approved. NOTE: you will need to STOP omeprazole one you begin this medication - this prescription has been sent to your preferred pharmacy  NOTE: If your medication(s) requires a PRIOR AUTHORIZATION, we will receive notification from your pharmacy. Once received, the process to submit for approval may take up to 7-10 business days. You will be contacted about any denials we have received from your insurance company as well as alternatives recommended by your provider.  Add famotidine 20 mg 2 times daily - this prescription has been sent to your preferred pharmacy Continue Dicyclomine 20 mg QID PRN Add Benefiber once daily, increasing to twice daily if no response to once daily Avoid dairy and sugar substitutes Colonoscopy due 2030  FOLLOW UP:  I would like for you to follow up with me in 4-6 months. Please call the office at [phone] to schedule your appointment.  BMI:  If you are age 4 or younger, your body mass index should be between 19-25. Your Body mass index is 38.12 kg/m. If this is out of the aformentioned range listed, please consider follow up with your Primary Care Provider.   MY CHART:  The [place] [facility] providers would like to encourage you to use [facility] to communicate with providers for non-urgent requests or questions.  Due to long hold times on the telephone, sending your provider a message by [facility] may be a faster and more efficient way to get a response.  Please allow 48 business hours for a response.  Please remember that this is for non-urgent requests.   Thank you for trusting me with your gastrointestinal care!    Tressia Danas, MD, MPH

## 2021-07-08 ENCOUNTER — Encounter: Payer: Self-pay | Admitting: Family Medicine

## 2021-07-08 ENCOUNTER — Telehealth (INDEPENDENT_AMBULATORY_CARE_PROVIDER_SITE_OTHER): Payer: No Typology Code available for payment source | Admitting: Family Medicine

## 2021-07-08 ENCOUNTER — Telehealth: Payer: Self-pay

## 2021-07-08 DIAGNOSIS — F418 Other specified anxiety disorders: Secondary | ICD-10-CM | POA: Diagnosis not present

## 2021-07-08 MED ORDER — BUSPIRONE HCL 15 MG PO TABS: 15.0000 mg | ORAL_TABLET | Freq: Two times a day (BID) | ORAL | 3 refills | Status: DC

## 2021-07-08 NOTE — Telephone Encounter (Signed)
PRIOR AUTHORIZATION  PA initiation date: 07/08/21  Medication: Dexilant Insurance Company: [employer] completed electronically through [employer] My Meds: Yes  Will await insurance response re: approval/denial.  Norma [name] (Key: [id number])  Your information has been submitted to [employer]. To check for an updated outcome later, reopen this PA request from your dashboard.  If [employer] has not responded to your request within 24 hours, contact [employer] at [phone]. If you think there may be a problem with your PA request, use our live chat feature at the bottom right.  Norma [name] KeyShellia Carwin - PA Case ID: [id number] - Rx #: [id number] Need help? Call us at [phone] Status Sent to [facility] Dexlansoprazole 60MG  dr capsules Form [employer] Electronic PA Form (2017 NCPDP) Original Claim Info 75 +MUST USE ESOMEPRAZOLE DR., LANSOPRAZOLEDR CAP., OMEPRAZOLE DR., PANTOPRAZOLE DR. TAB. OR MED NEC PA ONLY 06-19-2005 DRUG REQUIRES PRIOR AUTHORIZATION(PHARMACY HELP DESK [phone])

## 2021-07-08 NOTE — Progress Notes (Signed)
I connected with  Norma [name] on 07/08/21 by a video enabled telemedicine application and verified that I am speaking with the correct person using two identifiers.   I discussed the limitations of evaluation and management by telemedicine. The patient expressed understanding and agreed to proceed.

## 2021-07-08 NOTE — Progress Notes (Signed)
Virtual Visit via Video   I connected with patient on 07/08/21 at 10:30 AM EST by a video enabled telemedicine application and verified that I am speaking with the correct person using two identifiers.  Location patient: Home Location provider: [place], Office Persons participating in the virtual visit: Patient, Provider, CMA (Sabrina M)  I discussed the limitations of evaluation and management by telemedicine and the availability of in person appointments. The patient expressed understanding and agreed to proceed.  Subjective:   HPI:   Anxiety/Depression- ongoing issue for pt.  At last visit Buspar was added to her Wellbutrin 300mg  and Venlafaxine 150mg .  Pt hasn't noticed much of a difference in anxiety levels w/ addition of Buspar.  No side effects.  ROS:   See pertinent positives and negatives per HPI.  Patient Active Problem List   Diagnosis Date Noted   Obesity (BMI 30-39.9) 06/16/2020   Deviated septum 07/10/2019   Chronic nonintractable headache 07/10/2019   Chronic pansinusitis 07/10/2019   Nasal turbinate hypertrophy 07/10/2019   Physical exam 03/09/2019   Family history of hypertrophic cardiomyopathy 09/13/2018   RLS (restless legs syndrome) 12/31/2013   Obstructive sleep apnea 12/03/2013   Depression with anxiety 01/03/2013   Back pain, thoracic 01/03/2013    Social History   Tobacco Use   Smoking status: Never   Smokeless tobacco: Never  Substance Use Topics   Alcohol use: Yes    Alcohol/week: 0.0 standard drinks    Comment: SOCIALLY; Rare    Current Outpatient Medications:    buPROPion (WELLBUTRIN XL) 300 MG 24 hr tablet, TAKE 1 TABLET BY MOUTH EVERY DAY, Disp: 90 tablet, Rfl: 2   busPIRone (BUSPAR) 7.5 MG tablet, Take 1 tablet (7.5 mg total) by mouth 2 (two) times daily., Disp: 60 tablet, Rfl: 3   cetirizine (ZYRTEC) 10 MG tablet, Take 10 mg by mouth daily., Disp: , Rfl:    clonazePAM (KLONOPIN) 1 MG tablet, Take 1 tablet (1 mg total)  by mouth at bedtime as needed for anxiety., Disp: 30 tablet, Rfl: 3   dexlansoprazole (DEXILANT) 60 MG capsule, Take 1 capsule (60 mg total) by mouth daily., Disp: 90 capsule, Rfl: 3   famotidine (PEPCID) 20 MG tablet, Take 1 tablet (20 mg total) by mouth 2 (two) times daily., Disp: 60 tablet, Rfl: 3   ibuprofen (ADVIL,MOTRIN) 200 MG tablet, Take 800 mg by mouth every 6 (six) hours as needed for moderate pain., Disp: , Rfl:    levonorgestrel (MIRENA) 20 MCG/24HR IUD, 1 each by Intrauterine route once., Disp: , Rfl:    triamcinolone ointment (KENALOG) 0.1 %, APPLY TO AFFECTED AREA TWICE A DAY, Disp: 90 g, Rfl: 1   venlafaxine XR (EFFEXOR-XR) 150 MG 24 hr capsule, TAKE ONE CAPSULE EVERY DAY WITH BREAKFAST., Disp: 90 capsule, Rfl: 1   Vitamin D, Ergocalciferol, (DRISDOL) 1.25 MG (50000 UNIT) CAPS capsule, Take 1 capsule (50,000 Units total) by mouth every 7 (seven) days., Disp: 12 capsule, Rfl: 0  Allergies  Allergen Reactions   Depakote [Divalproex Sodium] Other (See Comments)    blisters   Codeine Hives    Objective:   There were no vitals taken for this visit.  AAOx3, NAD NCAT, EOMI No obvious CN deficits Coloring WNL Pt is able to speak clearly, coherently without shortness of breath or increased work of breathing.  Thought process is linear.  Mood is appropriate.   Assessment and Plan:   Anxiety/Depression- ongoing issue.  Buspar was added at last visit for increased anxiety  but pt doesn't note much of a change.  Thankfully no side effects, but no improvement either.  Will increase to 15mg  BID and continue to monitor.  Pt expressed understanding and is in agreement w/ plan.    , MD 07/08/2021

## 2021-07-09 NOTE — Telephone Encounter (Signed)
APPROVAL  Medication: [employer] Company: [employer] PA response: Approved Approval dates: 07/08/21 through 07/08/22 Misc. Notes: Alyson Ingles Key: [id number] - PA Case ID: [id number] - Rx #: [id number] Need help? Call us at [phone] Outcome Approvedon November 9 Your PA request has been approved. Additional information will be provided in the approval communication. (Message 1145) Drug Dexlansoprazole 60MG  dr capsules Form [employer] Electronic PA Form [id number] NCPDP) Original Claim Info 75 +MUST USE ESOMEPRAZOLE DR., LANSOPRAZOLEDR CAP., OMEPRAZOLE DR., PANTOPRAZOLE DR. TAB. OR MED NEC PA ONLY (9244 DRUG REQUIRES PRIOR AUTHORIZATION(PHARMACY HELP DESK [id number])

## 2021-07-22 ENCOUNTER — Telehealth: Payer: Self-pay | Admitting: Gastroenterology

## 2021-07-22 NOTE — Telephone Encounter (Signed)
Called pt and informed about Dr. Orvan Falconer response below. Verbalized acceptance and understanding.

## 2021-07-22 NOTE — Telephone Encounter (Signed)
I am sorry to hear that she is not feeling well. I agree with your recommendations for supportive care. Given the holiday weekend, would suggest evaluation in Urgent Care or ER if symptoms progress or persist. Please ask her to provide an update on Monday. Thanks.   KLB

## 2021-07-22 NOTE — Telephone Encounter (Signed)
Patient called you back to let you know that she is able to have her labs done where she is now.

## 2021-07-22 NOTE — Telephone Encounter (Signed)
Inbound call from patient states since she have been on the new medication dexlansoprazole she have been experiencing liquid diarrhea that have gotten worse since Sunday 11/20. Patient asked if it could be famotidine or buspirone that is causing that as well.

## 2021-07-22 NOTE — Telephone Encounter (Signed)
Called pt to inquire further about her symptoms. States she has been having 5 loose stools per day since 11/20, without bleeding, abnormal smell or discoloration. Does have increased upper abd pain and upper abd cramping with nausea, no vomiting. States this pain is worse with each episode of passing loose stool. Denies fever or chills. States her appetite is very poor. Has been eating a bland diet. Denies drinking adequate fluids. States no matter what she puts in her mouth seems to irritate or worse abd pain. States her GP did increase Buspar to 15mg  at the same time that she started Dexilant. She has been taking both of these medications since last week but decided to stop them today. Pt states she is currently vacationing in the [place]. [place] would [place] difficult to gets labs completed if needed. However, states she will come to [facility] if [facility] feels it would be most appropriate. Advised she remain hydrated with 64 ounces or more of fluids and to ensure she is taking in electrolytes such as Pedialyte or Gatorade. Given the length of time, frequency and # of loose stools, she is at increased risk for dehydration and electrolyte imbalance. Advised I will forward this information to Dr. Orvan Falconer for further recommendations but if her symptoms continue to worsen, she will need to proceed with ED for further evaluation and treatment.

## 2021-07-29 NOTE — Telephone Encounter (Signed)
Did not see that pt has provided update of her symptoms. Called to inquire further. LVM requesting returned call.

## 2021-07-30 NOTE — Telephone Encounter (Signed)
SECOND ATTEMPT: ° °LVM requesting returned call. °

## 2021-07-31 NOTE — Telephone Encounter (Signed)
FINAL ATTEMPT:  LVM requesting returned call 

## 2021-08-04 ENCOUNTER — Other Ambulatory Visit: Payer: Self-pay | Admitting: Family Medicine

## 2021-08-07 ENCOUNTER — Other Ambulatory Visit: Payer: Self-pay

## 2021-08-07 ENCOUNTER — Encounter (HOSPITAL_BASED_OUTPATIENT_CLINIC_OR_DEPARTMENT_OTHER): Payer: Self-pay | Admitting: Orthopaedic Surgery

## 2021-08-12 NOTE — H&P (Signed)
PREOPERATIVE H&P  Chief Complaint: RIGHT DEQUERVAINS  HPI: Norma [name] is a 50 y.o. female who is scheduled for, Procedure(s): RELEASE DORSAL COMPARTMENT (DEQUERVAIN).   Patient has a past medical history significant for GERD, PONV.   Patient is a 50 year-old who we have been following for some time.  We have done CMC injections x 2 and she did okay with those.  Unfortunately she has had new pain that radiates up into her distal forearm.  She has pain when she flexes her thumb down.    Her symptoms are rated as moderate to severe, and have been worsening.  This is significantly impairing activities of daily living.    Please see clinic note for further details on this patient's care.    She has elected for surgical management.   Past Medical History:  Diagnosis Date   Anxiety    ASCUS (atypical squamous cells of undetermined significance) on Pap smear 07   NEG HPV; BENIGN CHANGES 4/08   Chronic back pain    Claustrophobia    with mask on   Esophagitis    Esophagitis    Family history of adverse reaction to anesthesia    daughter Aundra Millet low blood pressure during ear tube surgery 2 years ago   Family history of hypertrophic cardiomyopathy    mother   GERD (gastroesophageal reflux disease)    Headache    hx migraines once a year or so now   Internal hemorrhoids    PONV (postoperative nausea and vomiting)    Sleep apnea    C PAP   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   Urinary incontinence    Stress incontinence   Past Surgical History:  Procedure Laterality Date   BICEPT TENODESIS Right 08/13/2020   Procedure: BICEPS TENODESIS;  Surgeon: Bjorn Pippin, MD;  Location: [facility];  Service: Orthopedics;  Laterality: Right;   BILATERAL FUSION  1992   age 57--Foot   CHOLECYSTECTOMY N/A 08/13/2016   Procedure: LAPAROSCOPIC CHOLECYSTECTOMY WITH INTRAOPERATIVE CHOLANGIOGRAM;  Surgeon: Avel Peace, MD;  Location: [facility];   Service: General;  Laterality: N/A;   deviated septum Bilateral 12/2019   GALLBLADDER SURGERY     INTRAUTERINE DEVICE INSERTION  07/2015   Mirena   INTRAUTERINE DEVICE INSERTION  05/19/2017   Mirena   LAPAROSCOPIC ABDOMINAL EXPLORATION  08/13/2016   Procedure: LAPAROSCOPIC  SMALL BOWEL EXPLORATION;  Surgeon: Avel Peace, MD;  Location: [facility];  Service: General;;   mirena removed  spring 2017   NASAL SEPTOPLASTY W/ TURBINOPLASTY Bilateral 01/02/2020   Procedure: NASAL SEPTOPLASTY WITH TURBINATE REDUCTION;  Surgeon: Osborn Coho, MD;  Location: [facility];  Service: ENT;  Laterality: Bilateral;   SHOULDER ARTHROSCOPY WITH DISTAL CLAVICLE RESECTION Right 08/13/2020   Procedure: SHOULDER ARTHROSCOPY WITH EXTENSIVE DEBRIDEMENT; DISTAL CLAVICULECTOMY;  Surgeon: Bjorn Pippin, MD;  Location: [facility];  Service: Orthopedics;  Laterality: Right;   TENDON REPAIR Right    repair in right shoulder   TONSILECTOMY, ADENOIDECTOMY, BILATERAL MYRINGOTOMY AND TUBES Bilateral 12/2019   TONSILLECTOMY  01/02/2020   TONSILLECTOMY Bilateral 01/02/2020   Procedure: TONSILLECTOMY;  Surgeon: Osborn Coho, MD;  Location: [facility] OR;  Service: ENT;  Laterality: Bilateral;   Social History   Socioeconomic History   Marital status: Married    Spouse name: Not on file   Number of children: Not on file   Years of education: Not on file   Highest education level:  Not on file  Occupational History   Not on file  Tobacco Use   Smoking status: Never   Smokeless tobacco: Never  Vaping Use   Vaping Use: Never used  Substance and Sexual Activity   Alcohol use: Yes    Alcohol/week: 0.0 standard drinks    Comment: SOCIALLY; Rare   Drug use: No   Sexual activity: Yes    Birth control/protection: I.U.D.    Comment: 1st intercourse 50 yo-Fewer than 5 partners Mirena IUD 05/19/2017  Other Topics Concern   Not on file  Social History Narrative   Not on file   Social Determinants of Health    Financial Resource Strain: Not on file  Food Insecurity: Not on file  Transportation Needs: Not on file  Physical Activity: Not on file  Stress: Not on file  Social Connections: Not on file   Family History  Problem Relation Age of Onset   Hypertension Maternal Grandfather    Heart disease Maternal Grandfather    Hypertension Paternal Grandmother    Heart disease Paternal Grandmother    Hypertension Paternal Grandfather    Heart disease Paternal Grandfather    Depression Father    Heart failure Maternal Grandmother    Cardiomyopathy Mother    Allergies  Allergen Reactions   Depakote [Divalproex Sodium] Other (See Comments)    blisters   Codeine Hives   Prior to Admission medications   Medication Sig Start Date End Date Taking? Authorizing Provider  buPROPion (WELLBUTRIN XL) 300 MG 24 hr tablet TAKE 1 TABLET BY MOUTH EVERY DAY 03/03/21  Yes Sheliah Hatch, MD  busPIRone (BUSPAR) 15 MG tablet TAKE 1 TABLET BY MOUTH 2 TIMES DAILY. 08/04/21  Yes Sheliah Hatch, MD  clonazePAM (KLONOPIN) 1 MG tablet Take 1 tablet (1 mg total) by mouth at bedtime as needed for anxiety. 05/06/21  Yes Janeece Agee, NP  dexlansoprazole (DEXILANT) 60 MG capsule Take 1 capsule (60 mg total) by mouth daily. 07/03/21  Yes Tressia Danas, MD  famotidine (PEPCID) 20 MG tablet Take 1 tablet (20 mg total) by mouth 2 (two) times daily. 07/03/21  Yes Tressia Danas, MD  levonorgestrel (MIRENA) 20 MCG/24HR IUD 1 each by Intrauterine route once.   Yes [provider]  venlafaxine XR (EFFEXOR-XR) 150 MG 24 hr capsule TAKE ONE CAPSULE EVERY DAY WITH BREAKFAST. 03/03/21  Yes Sheliah Hatch, MD  cetirizine (ZYRTEC) 10 MG tablet Take 10 mg by mouth daily.    [provider]  ibuprofen (ADVIL,MOTRIN) 200 MG tablet Take 800 mg by mouth every 6 (six) hours as needed for moderate pain.    [provider]  triamcinolone ointment (KENALOG) 0.1 % APPLY TO AFFECTED AREA TWICE A DAY  06/06/20   Sheliah Hatch, MD  Vitamin D, Ergocalciferol, (DRISDOL) 1.25 MG (50000 UNIT) CAPS capsule Take 1 capsule (50,000 Units total) by mouth every 7 (seven) days. 10/03/20   Sheliah Hatch, MD    ROS: All other systems have been reviewed and were otherwise negative with the exception of those mentioned in the HPI and as above.  Physical Exam: General: Alert, no acute distress Cardiovascular: No pedal edema Respiratory: No cyanosis, no use of accessory musculature GI: No organomegaly, abdomen is soft and non-tender Skin: No lesions in the area of chief complaint Neurologic: Sensation intact distally Psychiatric: Patient is competent for consent with normal mood and affect Lymphatic: No axillary or cervical lymphadenopathy  MUSCULOSKELETAL:  Tender to palpation about the first dorsal compartment.  She has a positive Finkelstein's.  Distal motor and sensory function are otherwise intact.  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  No acute bony abnormalities.  No signs of previous fracture.    Assessment: RIGHT DEQUERVAINS  Plan: Plan for Procedure(s): RELEASE DORSAL COMPARTMENT (DEQUERVAIN)  The risks benefits and alternatives were discussed with the patient including but not limited to the risks of nonoperative treatment, versus surgical intervention including infection, bleeding, nerve injury,  blood clots, cardiopulmonary complications, morbidity, mortality, among others, and they were willing to proceed.   The patient acknowledged the explanation, agreed to proceed with the plan and consent was signed.   Operative Plan: Right first dorsal compartment release Discharge Medications: Standard DVT Prophylaxis: None Physical Therapy: +/- Special Discharge needs: Splint   Vernetta Honey, PA-C  08/12/2021 7:39 AM

## 2021-08-12 NOTE — Progress Notes (Signed)
Text sent to come in to lab and pick up pre procedure drink.

## 2021-08-12 NOTE — Progress Notes (Signed)

## 2021-08-13 ENCOUNTER — Ambulatory Visit (HOSPITAL_BASED_OUTPATIENT_CLINIC_OR_DEPARTMENT_OTHER): Payer: No Typology Code available for payment source | Admitting: Anesthesiology

## 2021-08-13 ENCOUNTER — Ambulatory Visit (HOSPITAL_BASED_OUTPATIENT_CLINIC_OR_DEPARTMENT_OTHER)
Admission: RE | Admit: 2021-08-13 | Discharge: 2021-08-13 | Disposition: A | Discharge status: Home / Self Care | Payer: No Typology Code available for payment source | Attending: Orthopaedic Surgery | Admitting: Orthopaedic Surgery

## 2021-08-13 ENCOUNTER — Encounter (HOSPITAL_BASED_OUTPATIENT_CLINIC_OR_DEPARTMENT_OTHER): Payer: Self-pay | Admitting: Orthopaedic Surgery

## 2021-08-13 ENCOUNTER — Other Ambulatory Visit: Payer: Self-pay

## 2021-08-13 ENCOUNTER — Encounter (HOSPITAL_BASED_OUTPATIENT_CLINIC_OR_DEPARTMENT_OTHER)
Admission: RE | Disposition: A | Discharge status: Home / Self Care | Payer: Self-pay | Source: Home / Self Care | Attending: Orthopaedic Surgery

## 2021-08-13 DIAGNOSIS — M654 Radial styloid tenosynovitis [de Quervain]: Secondary | ICD-10-CM | POA: Insufficient documentation

## 2021-08-13 DIAGNOSIS — G4730 Sleep apnea, unspecified: Secondary | ICD-10-CM | POA: Insufficient documentation

## 2021-08-13 DIAGNOSIS — M19011 Primary osteoarthritis, right shoulder: Secondary | ICD-10-CM | POA: Diagnosis not present

## 2021-08-13 DIAGNOSIS — F32A Depression, unspecified: Secondary | ICD-10-CM | POA: Diagnosis not present

## 2021-08-13 DIAGNOSIS — R519 Headache, unspecified: Secondary | ICD-10-CM | POA: Insufficient documentation

## 2021-08-13 DIAGNOSIS — Z6838 Body mass index (BMI) 38.0-38.9, adult: Secondary | ICD-10-CM | POA: Diagnosis not present

## 2021-08-13 DIAGNOSIS — E669 Obesity, unspecified: Secondary | ICD-10-CM | POA: Insufficient documentation

## 2021-08-13 DIAGNOSIS — K2100 Gastro-esophageal reflux disease with esophagitis, without bleeding: Secondary | ICD-10-CM | POA: Diagnosis not present

## 2021-08-13 DIAGNOSIS — Z9189 Other specified personal risk factors, not elsewhere classified: Secondary | ICD-10-CM | POA: Diagnosis not present

## 2021-08-13 DIAGNOSIS — F419 Anxiety disorder, unspecified: Secondary | ICD-10-CM | POA: Insufficient documentation

## 2021-08-13 HISTORY — PX: DORSAL COMPARTMENT RELEASE: SHX5039

## 2021-08-13 HISTORY — PX: STERIOD INJECTION: SHX5046

## 2021-08-13 LAB — POCT PREGNANCY, URINE: Preg Test, Ur: NEGATIVE

## 2021-08-13 SURGERY — RELEASE, FIRST DORSAL COMPARTMENT, HAND
Anesthesia: General | Site: Wrist | Laterality: Right

## 2021-08-13 MED ORDER — METHYLPREDNISOLONE ACETATE 40 MG/ML IJ SUSP
INTRAMUSCULAR | Status: DC | PRN
  Administered 2021-08-13: 1.5 mL via INTRAMUSCULAR

## 2021-08-13 MED ORDER — BUPIVACAINE HCL (PF) 0.25 % IJ SOLN
INTRAMUSCULAR | Status: AC
  Filled 2021-08-13: qty 90

## 2021-08-13 MED ORDER — OXYCODONE HCL 5 MG/5ML PO SOLN: 5.0000 mg | Freq: Once | ORAL | Status: DC | PRN

## 2021-08-13 MED ORDER — DROPERIDOL 2.5 MG/ML IJ SOLN
INTRAMUSCULAR | Status: DC | PRN
  Administered 2021-08-13: .625 mg via INTRAVENOUS

## 2021-08-13 MED ORDER — ONDANSETRON HCL 4 MG/2ML IJ SOLN
INTRAMUSCULAR | Status: AC
  Filled 2021-08-13: qty 2

## 2021-08-13 MED ORDER — BUPIVACAINE HCL (PF) 0.5 % IJ SOLN
INTRAMUSCULAR | Status: AC
  Filled 2021-08-13: qty 30

## 2021-08-13 MED ORDER — FENTANYL CITRATE (PF) 100 MCG/2ML IJ SOLN
INTRAMUSCULAR | Status: DC | PRN
  Administered 2021-08-13: 50 ug via INTRAVENOUS
  Administered 2021-08-13 (×2): 25 ug via INTRAVENOUS

## 2021-08-13 MED ORDER — HYDROMORPHONE HCL 1 MG/ML IJ SOLN: 0.2500 mg | INTRAMUSCULAR | Status: DC | PRN

## 2021-08-13 MED ORDER — ACETAMINOPHEN 500 MG PO TABS: 1000.0000 mg | ORAL_TABLET | Freq: Three times a day (TID) | ORAL | 0 refills | Status: AC

## 2021-08-13 MED ORDER — MIDAZOLAM HCL 5 MG/5ML IJ SOLN
INTRAMUSCULAR | Status: DC | PRN
  Administered 2021-08-13: 2 mg via INTRAVENOUS

## 2021-08-13 MED ORDER — LIDOCAINE 2% (20 MG/ML) 5 ML SYRINGE
INTRAMUSCULAR | Status: AC
  Filled 2021-08-13: qty 5

## 2021-08-13 MED ORDER — LACTATED RINGERS IV SOLN: INTRAVENOUS | Status: DC

## 2021-08-13 MED ORDER — CELECOXIB 100 MG PO CAPS: 100.0000 mg | ORAL_CAPSULE | Freq: Two times a day (BID) | ORAL | 0 refills | Status: AC

## 2021-08-13 MED ORDER — MEPERIDINE HCL 25 MG/ML IJ SOLN: 6.2500 mg | INTRAMUSCULAR | Status: DC | PRN

## 2021-08-13 MED ORDER — PROPOFOL 500 MG/50ML IV EMUL
INTRAVENOUS | Status: AC
  Filled 2021-08-13: qty 50

## 2021-08-13 MED ORDER — LIDOCAINE 2% (20 MG/ML) 5 ML SYRINGE
INTRAMUSCULAR | Status: DC | PRN
  Administered 2021-08-13: 60 mg via INTRAVENOUS

## 2021-08-13 MED ORDER — PROPOFOL 10 MG/ML IV BOLUS
INTRAVENOUS | Status: DC | PRN
  Administered 2021-08-13: 200 mg via INTRAVENOUS

## 2021-08-13 MED ORDER — DEXAMETHASONE SODIUM PHOSPHATE 10 MG/ML IJ SOLN
INTRAMUSCULAR | Status: DC | PRN
  Administered 2021-08-13: 10 mg via INTRAVENOUS

## 2021-08-13 MED ORDER — FENTANYL CITRATE (PF) 100 MCG/2ML IJ SOLN
INTRAMUSCULAR | Status: AC
  Filled 2021-08-13: qty 2

## 2021-08-13 MED ORDER — METHYLPREDNISOLONE ACETATE 40 MG/ML IJ SUSP
INTRAMUSCULAR | Status: AC
  Filled 2021-08-13: qty 1

## 2021-08-13 MED ORDER — TRAMADOL HCL 50 MG PO TABS: 50.0000 mg | ORAL_TABLET | Freq: Four times a day (QID) | ORAL | 0 refills | Status: DC | PRN

## 2021-08-13 MED ORDER — ONDANSETRON HCL 4 MG PO TABS: 4.0000 mg | ORAL_TABLET | Freq: Three times a day (TID) | ORAL | 0 refills | Status: AC | PRN

## 2021-08-13 MED ORDER — ONDANSETRON HCL 4 MG/2ML IJ SOLN
INTRAMUSCULAR | Status: DC | PRN
  Administered 2021-08-13: 4 mg via INTRAVENOUS

## 2021-08-13 MED ORDER — MIDAZOLAM HCL 2 MG/2ML IJ SOLN
INTRAMUSCULAR | Status: AC
  Filled 2021-08-13: qty 2

## 2021-08-13 MED ORDER — DEXAMETHASONE SODIUM PHOSPHATE 10 MG/ML IJ SOLN
INTRAMUSCULAR | Status: AC
  Filled 2021-08-13: qty 1

## 2021-08-13 MED ORDER — OXYCODONE HCL 5 MG PO TABS: 5.0000 mg | ORAL_TABLET | Freq: Once | ORAL | Status: DC | PRN

## 2021-08-13 MED ORDER — CEFAZOLIN SODIUM-DEXTROSE 2-4 GM/100ML-% IV SOLN
INTRAVENOUS | Status: AC
  Filled 2021-08-13: qty 100

## 2021-08-13 MED ORDER — PROMETHAZINE HCL 25 MG/ML IJ SOLN: 6.2500 mg | INTRAMUSCULAR | Status: DC | PRN

## 2021-08-13 MED ORDER — CEFAZOLIN SODIUM-DEXTROSE 2-4 GM/100ML-% IV SOLN
2.0000 g | INTRAVENOUS | Status: AC
  Administered 2021-08-13: 2 g via INTRAVENOUS

## 2021-08-13 MED ORDER — DROPERIDOL 2.5 MG/ML IJ SOLN
INTRAMUSCULAR | Status: AC
  Filled 2021-08-13: qty 2

## 2021-08-13 MED ORDER — BUPIVACAINE HCL (PF) 0.25 % IJ SOLN
INTRAMUSCULAR | Status: DC | PRN
  Administered 2021-08-13: 10 mL

## 2021-08-13 SURGICAL SUPPLY — 58 items
APL SKNCLS STERI-STRIP NONHPOA (GAUZE/BANDAGES/DRESSINGS)
BENZOIN TINCTURE PRP APPL 2/3 (GAUZE/BANDAGES/DRESSINGS) ×2 IMPLANT
BLADE SURG 15 STRL LF DISP TIS (BLADE) ×4 IMPLANT
BLADE SURG 15 STRL SS (BLADE) ×8
BNDG CMPR 9X4 STRL LF SNTH (GAUZE/BANDAGES/DRESSINGS) ×2
BNDG COHESIVE 4X5 TAN ST LF (GAUZE/BANDAGES/DRESSINGS) ×2 IMPLANT
BNDG ELASTIC 4X5.8 VLCR STR LF (GAUZE/BANDAGES/DRESSINGS) ×4 IMPLANT
BNDG ESMARK 4X9 LF (GAUZE/BANDAGES/DRESSINGS) ×4 IMPLANT
CLOSURE STERI-STRIP 1/2X4 (GAUZE/BANDAGES/DRESSINGS)
CLOSURE WOUND 1/2 X4 (GAUZE/BANDAGES/DRESSINGS) ×1
CLSR STERI-STRIP ANTIMIC 1/2X4 (GAUZE/BANDAGES/DRESSINGS) IMPLANT
CORD BIPOLAR FORCEPS 12FT (ELECTRODE) ×4 IMPLANT
COVER BACK TABLE 60X90IN (DRAPES) ×2 IMPLANT
CUFF TOURN SGL QUICK 18X4 (TOURNIQUET CUFF) IMPLANT
DECANTER SPIKE VIAL GLASS SM (MISCELLANEOUS) IMPLANT
DRAPE EXTREMITY T 121X128X90 (DISPOSABLE) ×4 IMPLANT
DRAPE IMP U-DRAPE 54X76 (DRAPES) ×8 IMPLANT
DRAPE SURG 17X23 STRL (DRAPES) ×2 IMPLANT
DRAPE U-SHAPE 47X51 STRL (DRAPES) ×2 IMPLANT
ELECT REM PT RETURN 9FT ADLT (ELECTROSURGICAL)
ELECTRODE REM PT RTRN 9FT ADLT (ELECTROSURGICAL) ×2 IMPLANT
GAUZE SPONGE 4X4 12PLY STRL (GAUZE/BANDAGES/DRESSINGS) ×4 IMPLANT
GAUZE XEROFORM 1X8 LF (GAUZE/BANDAGES/DRESSINGS) ×4 IMPLANT
GLOVE SRG 8 PF TXTR STRL LF DI (GLOVE) ×2 IMPLANT
GLOVE SURG ENC MOIS LTX SZ6.5 (GLOVE) ×4 IMPLANT
GLOVE SURG LTX SZ8 (GLOVE) ×4 IMPLANT
GLOVE SURG POLYISO LF SZ7 (GLOVE) ×2 IMPLANT
GLOVE SURG UNDER POLY LF SZ6.5 (GLOVE) ×4 IMPLANT
GLOVE SURG UNDER POLY LF SZ7 (GLOVE) ×4 IMPLANT
GLOVE SURG UNDER POLY LF SZ8 (GLOVE) ×4
GOWN STRL REUS W/ TWL LRG LVL3 (GOWN DISPOSABLE) ×2 IMPLANT
GOWN STRL REUS W/TWL LRG LVL3 (GOWN DISPOSABLE) ×8
GOWN STRL REUS W/TWL XL LVL3 (GOWN DISPOSABLE) ×4 IMPLANT
NS IRRIG 1000ML POUR BTL (IV SOLUTION) ×4 IMPLANT
PACK BASIN DAY SURGERY FS (CUSTOM PROCEDURE TRAY) ×4 IMPLANT
PAD CAST 4YDX4 CTTN HI CHSV (CAST SUPPLIES) ×2 IMPLANT
PADDING CAST COTTON 4X4 STRL (CAST SUPPLIES) ×4
PENCIL SMOKE EVACUATOR (MISCELLANEOUS) ×2 IMPLANT
SLEEVE SCD COMPRESS KNEE MED (STOCKING) IMPLANT
SLING ARM FOAM STRAP LRG (SOFTGOODS) ×2 IMPLANT
SPLINT FAST PLASTER 5X30 (CAST SUPPLIES)
SPLINT PLASTER CAST FAST 5X30 (CAST SUPPLIES) ×2 IMPLANT
SPLINT PLASTER CAST XFAST 3X15 (CAST SUPPLIES) ×20 IMPLANT
SPLINT PLASTER XTRA FASTSET 3X (CAST SUPPLIES)
STOCKINETTE 4X48 STRL (DRAPES) ×4 IMPLANT
STRIP CLOSURE SKIN 1/2X4 (GAUZE/BANDAGES/DRESSINGS) ×1 IMPLANT
SUCTION FRAZIER HANDLE 10FR (MISCELLANEOUS) ×4
SUCTION TUBE FRAZIER 10FR DISP (MISCELLANEOUS) ×2 IMPLANT
SUT MNCRL AB 3-0 PS2 18 (SUTURE) IMPLANT
SUT MNCRL AB 4-0 PS2 18 (SUTURE) ×4 IMPLANT
SUT PROLENE 3 0 PS 2 (SUTURE) IMPLANT
SUT VIC AB 3-0 SH 27 (SUTURE) ×4
SUT VIC AB 3-0 SH 27X BRD (SUTURE) ×2 IMPLANT
SYR BULB EAR ULCER 3OZ GRN STR (SYRINGE) ×4 IMPLANT
TOWEL GREEN STERILE FF (TOWEL DISPOSABLE) ×4 IMPLANT
TUBE CONNECTING 20'X1/4 (TUBING) ×1
TUBE CONNECTING 20X1/4 (TUBING) ×3 IMPLANT
YANKAUER SUCT BULB TIP NO VENT (SUCTIONS) IMPLANT

## 2021-08-13 NOTE — Anesthesia Postprocedure Evaluation (Signed)
Anesthesia Post Note  Patient: Norma [name]  Procedure(s) Performed: RELEASE DORSAL COMPARTMENT (DEQUERVAIN) (Right: Wrist) STEROID INJECTION (Right: Thumb)     Patient location during evaluation: PACU Anesthesia Type: General Level of consciousness: awake and alert Pain management: pain level controlled Vital Signs Assessment: post-procedure vital signs reviewed and stable Respiratory status: spontaneous breathing, nonlabored ventilation and respiratory function stable Cardiovascular status: blood pressure returned to baseline and stable Postop Assessment: no apparent nausea or vomiting Anesthetic complications: no   No notable events documented.  Last Vitals:  Vitals:   08/13/21 0827 08/13/21 0838  BP:  137/90  Pulse: 71 77  Resp: 13 16  Temp:  36.6 C  SpO2: 96% 99%    Last Pain:  Vitals:   08/13/21 0845  TempSrc:   PainSc: 3                  Lowella Curb

## 2021-08-13 NOTE — Anesthesia Procedure Notes (Signed)
Procedure Name: LMA Insertion Date/Time: 08/13/2021 7:36 AM Performed by: Burna Cash, CRNA Pre-anesthesia Checklist: Patient identified, Emergency Drugs available, Suction available and Patient being monitored Patient Re-evaluated:Patient Re-evaluated prior to induction Oxygen Delivery Method: Circle system utilized Preoxygenation: Pre-oxygenation with 100% oxygen Induction Type: IV induction Ventilation: Mask ventilation without difficulty LMA: LMA inserted LMA Size: 4.0 Number of attempts: 1 Airway Equipment and Method: Bite block Placement Confirmation: positive ETCO2 Tube secured with: Tape Dental Injury: Teeth and Oropharynx as per pre-operative assessment

## 2021-08-13 NOTE — Transfer of Care (Signed)
Immediate Anesthesia Transfer of Care Note  Patient: Norma [name]  Procedure(s) Performed: RELEASE DORSAL COMPARTMENT (DEQUERVAIN) (Right: Wrist) STEROID INJECTION (Right: Thumb)  Patient Location: PACU  Anesthesia Type:General  Level of Consciousness: awake, alert  and oriented  Airway & Oxygen Therapy: Patient Spontanous Breathing and Patient connected to face mask oxygen  Post-op Assessment: Report given to RN and Post -op Vital signs reviewed and stable  Post vital signs: Reviewed and stable  Last Vitals:  Vitals Value Taken Time  BP 144/87 08/13/21 0804  Temp    Pulse 76 08/13/21 0806  Resp 16 08/13/21 0806  SpO2 100 % 08/13/21 0806  Vitals shown include unvalidated device data.  Last Pain:  Vitals:   08/13/21 0639  TempSrc: Oral  PainSc: 0-No pain      Patients Stated Pain Goal: 7 (08/13/21 2836)  Complications: No notable events documented.

## 2021-08-13 NOTE — Interval H&P Note (Signed)
All questions answered, patient wants to proceed with procedure. ? ?

## 2021-08-13 NOTE — Discharge Instructions (Addendum)
Ramond Marrow MD, MPH Alfonse Alpers, PA-C [facility] [address], [address] [phone] (tel)   [phone] (fax)   POST-OPERATIVE INSTRUCTIONS   WOUND CARE You may remove your bandage on postop day 3 and get the hand wet.   Leave steri-strips in place until they fall off on their own You may shower on post-op day #3 when you remove your dressings Keep incisions clean and dry! No ointments or lotions to be applied to the incision.   Do not submerge the incision for 1 month. No washing dirty dishes or putting hand in dirty dish water, etc.   EXERCISES  Please continue to work on range of motion of your fingers and stretch these multiple times a day to prevent stiffness. Please continue to ambulate and do not stay sitting or lying for too long. Perform foot and wrist pumps to assist in circulation. Use your removable brace at all times You may remove for hygiene only   FOLLOW-UP If you develop a Fever (>101.5), Redness or Drainage from the surgical incision site, please call our office to arrange for an evaluation. Please call the office to schedule a follow-up appointment for your incision check if you do not already have one, 7-10 days post-operatively.  REGIONAL ANESTHESIA (NERVE BLOCKS) The anesthesia team may have performed a nerve block for you if safe in the setting of your care.  This is a great tool used to minimize pain.  Typically the block may start wearing off overnight but the long acting medicine may last for 3-4 days.  The nerve block wearing off can be a challenging period but please utilize your as needed pain medications to try and manage this period.    POST-OP MEDICATIONS- Multimodal approach to pain control  In general your pain will be controlled with a combination of substances.  Prescriptions unless otherwise discussed are electronically sent to your pharmacy.  This is a carefully made plan we use to minimize narcotic use.       - Celebrex - Anti-inflammatory medication taken on a scheduled basis  - Acetaminophen - Non-narcotic pain medicine taken on a scheduled basis   - Tramadol - This is a strong narcotic, to be used only on an "as needed" basis for SEVERE pain.             -           Zofran - take as needed for nausea   HELPFUL INFORMATION   If you had a block, it will wear off between 8-24 hrs postop typically.  This is period when your pain may go from nearly zero to the pain you would have had postop without the block.  This is an abrupt transition but nothing dangerous is happening.  You may take an extra dose of narcotic when this happens.   You may be more comfortable sleeping in a semi-seated position the first few nights following surgery.  Keep a pillow propped under the elbow and forearm for comfort.  If you have a recliner type of chair it might be beneficial.  If not that is fine too, but it would be helpful to sleep propped up with pillows behind your operated shoulder as well under your elbow and forearm.  This will reduce pulling on the suture lines.   When dressing, put your operative arm in the sleeve first.  When getting undressed, take your operative arm out last.  Loose fitting, button-down shirts are recommended.  Often in  the first days after surgery you may be more comfortable keeping your operative arm under your shirt and not through the sleeve.   You may return to work/school in the next couple of days when you feel up to it.  Desk work and typing in the sling is fine.   We suggest you use the pain medication the first night prior to going to bed, in order to ease any pain when the anesthesia wears off. You should avoid taking pain medications on an empty stomach as it will make you nauseous.   You should wean off your narcotic medicines as soon as you are able.  Most patients will be off or using minimal narcotics before their first postop appointment.    Do not drink alcoholic  beverages or take illicit drugs when taking pain medications.   It is against the law to drive while taking narcotics.  In some states it is against the law to drive while your arm is in a sling.    Pain medication may make you constipated.  Below are a few solutions to try in this order:   - Decrease the amount of pain medication if you aren't having pain.   - Drink lots of decaffeinated fluids.   - Drink prune juice and/or each dried prunes   If the first 3 don't work start with additional solutions   - Take Colace - an over-the-counter stool softener   - Take Senokot - an over-the-counter laxative   - Take Miralax - a stronger over-the-counter laxative   For more information including helpful videos and documents visit our website:   [email or web address]    Post Anesthesia Home Care Instructions  Activity: Get plenty of rest for the remainder of the day. A responsible individual must stay with you for 24 hours following the procedure.  For the next 24 hours, DO NOT: -Drive a car -Advertising copywriter -Drink alcoholic beverages -Take any medication unless instructed by your physician -Make any legal decisions or sign important papers.  Meals: Start with liquid foods such as gelatin or soup. Progress to regular foods as tolerated. Avoid greasy, spicy, heavy foods. If nausea and/or vomiting occur, drink only clear liquids until the nausea and/or vomiting subsides. Call your physician if vomiting continues.  Special Instructions/Symptoms: Your throat may feel dry or sore from the anesthesia or the breathing tube placed in your throat during surgery. If this causes discomfort, gargle with warm salt water. The discomfort should disappear within 24 hours.  If you had a scopolamine patch placed behind your ear for the management of post- operative nausea and/or vomiting:  1. The medication in the patch is effective for 72 hours, after which it should be  removed.  Wrap patch in a tissue and discard in the trash. Wash hands thoroughly with soap and water. 2. You may remove the patch earlier than 72 hours if you experience unpleasant side effects which may include dry mouth, dizziness or visual disturbances. 3. Avoid touching the patch. Wash your hands with soap and water after contact with the patch.

## 2021-08-13 NOTE — Op Note (Signed)
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  Patient asked for [facility] injection at the time of the surgery and we thought this was appropriate.  This is outside of her surgical field.  We released the first dorsal compartment without a separate septae.  We placed her in a removable splint.  Post-operative plan: The patient will be nonweightbearing in a removable splint for the first month.  The patient will be discharged home.  DVT prophylaxis not indicated in this ambulatory upper extremity patient without significant risk factors.   Pain control with PRN pain medication preferring oral medicines.  Follow up plan will be scheduled in approximately 7 days for incision check.  Post-Op Diagnosis: Same Surgeons:Primary: Bjorn Pippin, MD Assistants:Caroline McBane PA-C Location: [facility] OR ROOM 1 Anesthesia: General with local anesthesia Antibiotics: Ancef 2 g with local vancomycin powder 1 g at the surgical site Tourniquet time:  Total Tourniquet Time Documented: Forearm (Right) - 9 minutes Total: Forearm (Right) - 9 minutes  Estimated Blood Loss: Minimal Complications: None Specimens: None Implants: * No implants in log *  Indications for Surgery:   Norma [name] is a 50 y.o. female with de Quervain's tenosynovitis refractory to nonoperative measures and CMC arthritis.  Benefits and risks of operative and nonoperative management were discussed prior to surgery with patient/guardian(s) and informed consent form was completed.  Specific risks including infection, need for additional surgery, need nerve injury, tendon subluxation amongst others.   Procedure:   The patient was identified  properly. Informed consent was obtained and the surgical site was marked. The patient was taken up to suite where general anesthesia was induced.  The patient was positioned supine on a regular bed.  The right wrist was prepped and draped in the usual sterile fashion.  Timeout was performed before the beginning of the case.  Tourniquet was used for the above duration.  Initially we injected 1 cc of Depo-Medrol, 40 mg and 1/2 cc Marcaine at the [facility] joint patient tolerated well.  This was of the thumb.  We made a longitudinal incision over the first dorsal compartment about 2 cm in length.  We dissected through the skin alone sharply and then when we reach subcutaneous tissue performed blunt dissection in the direction of the superficial radial nerve to avoid damage to the structure.  It was visualized and protected with a vein retractor throughout the case.  We were able to identify the  first dorsal extensor compartment and identified the 2 tendons at the distal aspect of this compartment and were able to release the dorsal aspect of the compartment to avoid volar subluxation of the tendons.  There is significant tenosynovitis around each tendon and we resected this tissue excisionally.  At the end of the case we took care to note that the tendons were completely mobile and released and that the nerve was intact.  The incision was thoroughly irrigated and closed in a multilayer fashion with absorbable sutures. A sterile dressing was placed.  A thumb spica splint removable was placed.  The patient was awoken from general anesthesia and taken to the PACU in stable condition without complication.   Alfonse Alpers, PA-C, present and scrubbed throughout the case, critical for completion in a timely fashion, and for retraction,  instrumentation, closure.

## 2021-08-13 NOTE — Anesthesia Preprocedure Evaluation (Signed)
Anesthesia Evaluation  Patient identified by MRN, date of birth, ID band  Reviewed: Allergy & Precautions, NPO status , Patient's Chart, lab work & pertinent test results  History of Anesthesia Complications (+) PONV, Family history of anesthesia reaction and history of anesthetic complications  Airway Mallampati: II  TM Distance: >3 FB Neck ROM: Full    Dental no notable dental hx. (+) Teeth Intact   Pulmonary sleep apnea and Continuous Positive Airway Pressure Ventilation ,    Pulmonary exam normal breath sounds clear to auscultation       Cardiovascular negative cardio ROS Normal cardiovascular exam Rhythm:Regular Rate:Normal     Neuro/Psych  Headaches, PSYCHIATRIC DISORDERS Anxiety Depression    GI/Hepatic Neg liver ROS, GERD  Controlled and Medicated,  Endo/Other    Renal/GU negative Renal ROS   Stress Urinary incontinence    Musculoskeletal  (+) Arthritis , Osteoarthritis,  Impingement syndrome Right shoulder Right Biceps tendonitis Primary OA right shoulder   Abdominal (+) + obese,   Peds  Hematology  (+) REFUSES BLOOD PRODUCTS,   Anesthesia Other Findings   Reproductive/Obstetrics                             Anesthesia Physical  Anesthesia Plan  ASA: 2  Anesthesia Plan: General   Post-op Pain Management:  Regional for Post-op pain   Induction: Intravenous  PONV Risk Score and Plan: 4 or greater and Midazolam, Ondansetron, Treatment may vary due to age or medical condition, Dexamethasone and Droperidol  Airway Management Planned: LMA  Additional Equipment:   Intra-op Plan:   Post-operative Plan: Extubation in OR  Informed Consent: I have reviewed the patients History and Physical, chart, labs and discussed the procedure including the risks, benefits and alternatives for the proposed anesthesia with the patient or authorized representative who has indicated his/her  understanding and acceptance.     Dental advisory given  Plan Discussed with: CRNA and Anesthesiologist  Anesthesia Plan Comments:         Anesthesia Quick Evaluation

## 2021-08-17 ENCOUNTER — Encounter (HOSPITAL_BASED_OUTPATIENT_CLINIC_OR_DEPARTMENT_OTHER): Payer: Self-pay | Admitting: Orthopaedic Surgery

## 2021-09-02 ENCOUNTER — Other Ambulatory Visit: Payer: Self-pay | Admitting: Family Medicine

## 2021-09-02 ENCOUNTER — Other Ambulatory Visit: Payer: Self-pay | Admitting: Gastroenterology

## 2021-09-02 DIAGNOSIS — K2970 Gastritis, unspecified, without bleeding: Secondary | ICD-10-CM

## 2021-10-03 ENCOUNTER — Other Ambulatory Visit: Payer: Self-pay | Admitting: Gastroenterology

## 2021-10-03 DIAGNOSIS — K2970 Gastritis, unspecified, without bleeding: Secondary | ICD-10-CM

## 2021-10-03 DIAGNOSIS — K219 Gastro-esophageal reflux disease without esophagitis: Secondary | ICD-10-CM

## 2021-10-06 NOTE — Telephone Encounter (Signed)
Dexilant is to expensive patient requesting something cheaper.

## 2021-10-07 ENCOUNTER — Other Ambulatory Visit: Payer: Self-pay | Admitting: Gastroenterology

## 2021-10-07 DIAGNOSIS — K219 Gastro-esophageal reflux disease without esophagitis: Secondary | ICD-10-CM

## 2021-10-07 DIAGNOSIS — K2970 Gastritis, unspecified, without bleeding: Secondary | ICD-10-CM

## 2021-11-27 ENCOUNTER — Other Ambulatory Visit: Payer: Self-pay | Admitting: Family Medicine

## 2021-12-30 ENCOUNTER — Other Ambulatory Visit: Payer: Self-pay | Admitting: Registered Nurse

## 2021-12-30 DIAGNOSIS — F418 Other specified anxiety disorders: Secondary | ICD-10-CM

## 2021-12-30 NOTE — Telephone Encounter (Signed)
Deferring back to you  Thanks,  Rich

## 2021-12-31 ENCOUNTER — Ambulatory Visit: Payer: No Typology Code available for payment source | Admitting: Family Medicine

## 2021-12-31 ENCOUNTER — Encounter: Payer: Self-pay | Admitting: Family Medicine

## 2021-12-31 VITALS — BP 122/80 | HR 78 | Temp 98.7°F | Resp 16 | Ht 67.0 in | Wt 242.4 lb

## 2021-12-31 DIAGNOSIS — E669 Obesity, unspecified: Secondary | ICD-10-CM

## 2021-12-31 DIAGNOSIS — F418 Other specified anxiety disorders: Secondary | ICD-10-CM

## 2021-12-31 MED ORDER — WEGOVY 0.25 MG/0.5ML ~~LOC~~ SOAJ: 0.2500 mg | SUBCUTANEOUS | 2 refills | Status: DC

## 2021-12-31 NOTE — Patient Instructions (Signed)
Follow up in 3-4 weeks to recheck [facility] ?START with the 0.25mg  dose x2 weeks (2 injection) and then increase to the 0.5mg  weekly dose ?Continue to work on healthy diet and regular exercise- you can do it! ?Call with any questions or concerns ?Stay Safe!  Stay Healthy! ?Hang in there!!! ?

## 2021-12-31 NOTE — Progress Notes (Signed)
? ?  Subjective:  ? ? Patient ID: Norma [name], female    DOB: [birth year], 51 y.o.   MRN: [id number] ? ?HPI ?Medication side effect- pt reports GI issues w/ Buspar.  These resolved once she stopped the Buspar.  She states that she needs additional control of anxiety- currently on Wellbutrin 300mg , Venlafaxine 150mg .  Pt reports increased mood swings.  'i'm so overweight I don't even want to go out'.  Doesn't want her picture taken. ? ?Obesity- pt is very frustrated at her inability to lose weight.  She reports she is bouncing 'up and down' and this is not healthy.  Has done , calorie counting, Keto, Optivia w/o results.  Has dramatically decreased chocolate intake, has cut back on diet soda.   ? ? ?Review of Systems ?For ROS see HPI  ?   ?Objective:  ? Physical Exam ?Vitals reviewed.  ?Constitutional:   ?   General: She is not in acute distress. ?   Appearance: Normal appearance. She is obese. She is not ill-appearing.  ?HENT:  ?   Head: Normocephalic and atraumatic.  ?Eyes:  ?   Extraocular Movements: Extraocular movements intact.  ?   Conjunctiva/sclera: Conjunctivae normal.  ?   Pupils: Pupils are equal, round, and reactive to light.  ?Skin: ?   General: Skin is warm and dry.  ?Neurological:  ?   General: No focal deficit present.  ?   Mental Status: She is alert and oriented to person, place, and time.  ?Psychiatric:  ?   Comments: Tearful, depressed  ? ? ? ? ? ?   ?Assessment & Plan:  ? ? ?

## 2022-01-03 NOTE — Assessment & Plan Note (Signed)
onging issue for pt.  BMI now 37.97  She says her weight will bounce up and down and she knows this isn't healthy.  She has tried multiple things to lose weight- [employer], counting calories, Keto, Optivia- and she has not had results.  She reports that she is trying to eat better but when she doesn't see any results, the frustration leads her back to bad habits.  Will start Wegovy to assist w/ weight loss.  Talked about appropriate use, possible side effects, and need to use in conjunction w/ healthy diet and regular exercise.  Pt expressed understanding and is in agreement w/ plan.  ?

## 2022-01-03 NOTE — Assessment & Plan Note (Signed)
Deteriorated.  Pt did not tolerate Buspar due to GI issues.  Currently on Wellbutrin 300mg  and Venlafaxine 150mg  daily.  Feels that the majority of her depression stems from her obesity.  She doesn't want to leave the house, she doesn't want her picture taken.  She feels that she isn't able to participate w/ her kids due to her size.  Rather than adjust depression meds at this time, will attempt to treat the underlying issue by starting [facility].  Pt expressed understanding and is in agreement w/ plan.  ?

## 2022-01-12 ENCOUNTER — Telehealth: Payer: Self-pay

## 2022-01-12 NOTE — Telephone Encounter (Signed)
Spoke w/  pt and informed her that her Reginal Lutes RX was approved .  ?

## 2022-01-21 ENCOUNTER — Encounter: Payer: Self-pay | Admitting: Family Medicine

## 2022-01-21 ENCOUNTER — Ambulatory Visit: Payer: No Typology Code available for payment source | Admitting: Family Medicine

## 2022-01-21 VITALS — BP 118/80 | HR 80 | Temp 97.9°F | Resp 16 | Ht 67.0 in | Wt 241.2 lb

## 2022-01-21 DIAGNOSIS — E669 Obesity, unspecified: Secondary | ICD-10-CM

## 2022-01-21 LAB — BASIC METABOLIC PANEL
BUN: 14 mg/dL (ref 6–23)
CO2: 30 mEq/L (ref 19–32)
Calcium: 9.3 mg/dL (ref 8.4–10.5)
Chloride: 100 mEq/L (ref 96–112)
Creatinine, Ser: 0.77 mg/dL (ref 0.40–1.20)
GFR: 90.98 mL/min (ref >60.00)
Glucose, Bld: 84 mg/dL (ref 70–99)
Potassium: 4.1 mEq/L (ref 3.5–5.1)
Sodium: 141 mEq/L (ref 135–145)

## 2022-01-21 LAB — CBC WITH DIFFERENTIAL/PLATELET
Basophils Absolute: 0 10*3/uL (ref 0.0–0.1)
Basophils Relative: 0.7 % (ref 0.0–3.0)
Eosinophils Absolute: 0.1 10*3/uL (ref 0.0–0.7)
Eosinophils Relative: 1.6 % (ref 0.0–5.0)
HCT: 39.1 % (ref 36.0–46.0)
Hemoglobin: 12.9 g/dL (ref 12.0–15.0)
Lymphocytes Relative: 28.5 % (ref 12.0–46.0)
Lymphs Abs: 1.6 10*3/uL (ref 0.7–4.0)
MCHC: 32.9 g/dL (ref 30.0–36.0)
MCV: 85.1 fl (ref 78.0–100.0)
Monocytes Absolute: 0.3 10*3/uL (ref 0.1–1.0)
Monocytes Relative: 4.8 % (ref 3.0–12.0)
Neutro Abs: 3.7 10*3/uL (ref 1.4–7.7)
Neutrophils Relative %: 64.4 % (ref 43.0–77.0)
Platelets: 194 10*3/uL (ref 150.0–400.0)
RBC: 4.59 Mil/uL (ref 3.87–5.11)
RDW: 13.5 % (ref 11.5–15.5)
WBC: 5.7 10*3/uL (ref 4.0–10.5)

## 2022-01-21 LAB — HEPATIC FUNCTION PANEL
ALT: 12 U/L (ref 0–35)
AST: 12 U/L (ref 0–37)
Albumin: 4.7 g/dL (ref 3.5–5.2)
Alkaline Phosphatase: 96 U/L (ref 39–117)
Bilirubin, Direct: 0.1 mg/dL (ref 0.0–0.3)
Total Bilirubin: 0.7 mg/dL (ref 0.2–1.2)
Total Protein: 7.2 g/dL (ref 6.0–8.3)

## 2022-01-21 LAB — LIPID PANEL
Cholesterol: 167 mg/dL (ref 0–200)
HDL: 57.7 mg/dL (ref >39.00)
LDL Cholesterol: 86 mg/dL (ref 0–99)
NonHDL: 109.42
Total CHOL/HDL Ratio: 3
Triglycerides: 115 mg/dL (ref 0.0–149.0)
VLDL: 23 mg/dL (ref 0.0–40.0)

## 2022-01-21 LAB — VITAMIN D 25 HYDROXY (VIT D DEFICIENCY, FRACTURES): VITD: 19.4 ng/mL — ABNORMAL LOW (ref 30.00–100.00)

## 2022-01-21 LAB — TSH: TSH: 1.41 u[IU]/mL (ref 0.35–5.50)

## 2022-01-21 MED ORDER — WEGOVY 0.5 MG/0.5ML ~~LOC~~ SOAJ: 0.5000 mg | SUBCUTANEOUS | 1 refills | Status: DC

## 2022-01-21 NOTE — Patient Instructions (Signed)
Follow up in 4-6 weeks to recheck weight loss INCREASE the Wegovy to 0.5mg  weekly Continue to work on low carb diet and regular exercise- you're doing great!! Call with any questions or concerns Have a great trip!!!

## 2022-01-21 NOTE — Progress Notes (Signed)
   Subjective:    Patient ID: Norma [name], female    DOB: 09-20-1970, 51 y.o.   MRN: [id number]  HPI Obesity- pt stated Wegovy on 5/4.  Currently on 0.25mg  dose.  Pt is not craving sweets like she was before.  No nausea or vomiting.  Has not lost weight which is frustrating.  Open to idea of titrating dose.     Review of Systems For ROS see HPI     Objective:   Physical Exam Vitals reviewed.  Constitutional:      General: She is not in acute distress.    Appearance: Normal appearance. She is obese. She is not ill-appearing.  HENT:     Head: Normocephalic and atraumatic.  Eyes:     Extraocular Movements: Extraocular movements intact.     Conjunctiva/sclera: Conjunctivae normal.  Skin:    General: Skin is warm and dry.  Neurological:     General: No focal deficit present.     Mental Status: She is alert and oriented to person, place, and time.  Psychiatric:        Mood and Affect: Mood normal.        Behavior: Behavior normal.        Thought Content: Thought content normal.          Assessment & Plan:   Obesity- ongoing issue for pt.  At last visit was started on Wegovy 0.25mg  weekly.  She reports a decrease in her appetite and sugar cravings but this has not yet translated to weight loss.  Will increase to 0.5mg  weekly and continue to titrate as long as nausea doesn't become an issue.  Pt expressed understanding and is in agreement w/ plan.

## 2022-01-22 ENCOUNTER — Telehealth: Payer: Self-pay

## 2022-01-22 ENCOUNTER — Other Ambulatory Visit: Payer: Self-pay

## 2022-01-22 DIAGNOSIS — E559 Vitamin D deficiency, unspecified: Secondary | ICD-10-CM

## 2022-01-22 MED ORDER — VITAMIN D (ERGOCALCIFEROL) 1.25 MG (50000 UNIT) PO CAPS: 50000.0000 [IU] | ORAL_CAPSULE | ORAL | 1 refills | Status: DC

## 2022-01-22 NOTE — Telephone Encounter (Signed)
-----   Message from Sheliah Hatch, MD sent at 01/22/2022  7:30 AM EDT ----- Labs look great w/ exception of low Vit D.  Based on this, we need to start prescription 50,000 units weekly x12 weeks in addition to daily OTC supplement of at least 2000 units.

## 2022-01-22 NOTE — Progress Notes (Signed)
itm

## 2022-01-27 NOTE — Telephone Encounter (Signed)
Pt has been made aware of this.  °

## 2022-02-03 ENCOUNTER — Encounter: Payer: Self-pay | Admitting: Family Medicine

## 2022-02-04 MED ORDER — SAXENDA 18 MG/3ML ~~LOC~~ SOPN: PEN_INJECTOR | SUBCUTANEOUS | 0 refills | Status: DC

## 2022-02-05 ENCOUNTER — Telehealth: Payer: Self-pay | Admitting: Family Medicine

## 2022-02-05 NOTE — Telephone Encounter (Signed)
Pt insurance called to inform us that PT pharmacy has posted prior authorization form to cover my meds. PT pharmacy will try to faxed over as well.

## 2022-02-08 NOTE — Telephone Encounter (Signed)
PA has been submitted. Waiting on response. 

## 2022-02-18 ENCOUNTER — Ambulatory Visit: Payer: No Typology Code available for payment source | Admitting: Family Medicine

## 2022-02-26 ENCOUNTER — Other Ambulatory Visit: Payer: Self-pay | Admitting: Family Medicine

## 2022-02-26 ENCOUNTER — Other Ambulatory Visit: Payer: Self-pay | Admitting: Gastroenterology

## 2022-02-26 DIAGNOSIS — K2970 Gastritis, unspecified, without bleeding: Secondary | ICD-10-CM

## 2022-03-10 ENCOUNTER — Encounter: Payer: Self-pay | Admitting: Family Medicine

## 2022-03-12 ENCOUNTER — Other Ambulatory Visit: Payer: Self-pay | Admitting: Family Medicine

## 2022-04-07 ENCOUNTER — Encounter (INDEPENDENT_AMBULATORY_CARE_PROVIDER_SITE_OTHER): Payer: Self-pay

## 2022-05-07 NOTE — Telephone Encounter (Signed)
error 

## 2022-07-12 LAB — HM MAMMOGRAPHY

## 2022-08-02 ENCOUNTER — Telehealth: Payer: Self-pay | Admitting: Family Medicine

## 2022-08-02 DIAGNOSIS — F418 Other specified anxiety disorders: Secondary | ICD-10-CM

## 2022-08-02 MED ORDER — BUPROPION HCL ER (XL) 300 MG PO TB24: 300.0000 mg | ORAL_TABLET | Freq: Every day | ORAL | 2 refills | Status: DC

## 2022-08-02 MED ORDER — CLONAZEPAM 1 MG PO TABS: ORAL_TABLET | ORAL | 3 refills | Status: DC

## 2022-08-02 MED ORDER — WEGOVY 1.7 MG/0.75ML ~~LOC~~ SOAJ
1.7000 mg | SUBCUTANEOUS | 3 refills | Status: DC
Start: 2022-08-02 — End: 2022-10-08

## 2022-08-02 NOTE — Telephone Encounter (Signed)
Initial Comment Caller states would like to know why Dr. Beverely Low was taken of her MyChart as her primary provider and she would also like to know why some of her medications have been removed of her MyChart. Additional Comment She will be calling early Monday morning. Office hours provided. She is also very upset about this issue. Disp. Time Disposition Final User 07/31/2022 10:16:47 AM General Information Provided Yes [name], Norma Call Closed By: Gerre Scull Transaction Date/Time: 07/31/2022 10:11:43 AM (ET)

## 2022-08-02 NOTE — Telephone Encounter (Signed)
I sent the Bupropion, Clonazepam, and Wegovy to her local pharmacy

## 2022-08-02 NOTE — Telephone Encounter (Signed)
I dont know why she would have had items removed I cannot tell what has been, unsure how to handle this since it was not an action we took to cause this issue?

## 2022-08-02 NOTE — Telephone Encounter (Signed)
Pt needs these two medications refilled as well as the wegovy either 1.7 or 2.4 OR off brand she had been taking notes she hasn't had anything July

## 2022-08-02 NOTE — Telephone Encounter (Signed)
Please let pt know that on our end, I am still showing as her PCP.  I know there has been an issue where people have not been able to message me as their PCP on MyChart, but that's a system error since the last upgrade and doesn't change their provider.  And I have not edited or removed any medications from her chart.  Sometimes, depending on how they are ordered, they will automatically fall off after a certain amount of time, but we have not made any changes

## 2022-08-02 NOTE — Telephone Encounter (Signed)
Pt informed

## 2022-08-06 ENCOUNTER — Other Ambulatory Visit: Payer: Self-pay

## 2022-08-06 MED ORDER — WEGOVY 2.4 MG/0.75ML ~~LOC~~ SOAJ: 2.4000 mg | SUBCUTANEOUS | 3 refills | Status: DC

## 2022-08-06 NOTE — Telephone Encounter (Signed)
Ok to send 2.4 mg dose- disp 67ml, 3 refills

## 2022-08-06 NOTE — Telephone Encounter (Signed)
Left pt a VM stating we sent in Wegovy 2.4 mg to the pharmacy

## 2022-08-06 NOTE — Telephone Encounter (Addendum)
Patient called back stating that her pharmacy only 2.4 of the wegovy in stock. Pt would need a new prescription for this medication. Patient pharmacy is  [id number] - [place], [place] - [address]

## 2022-08-06 NOTE — Telephone Encounter (Signed)
Is it ok to order the [facility] 2. 4 mg for pt

## 2022-08-09 ENCOUNTER — Other Ambulatory Visit: Payer: Self-pay

## 2022-08-10 ENCOUNTER — Telehealth: Payer: Self-pay | Admitting: Family Medicine

## 2022-08-10 NOTE — Telephone Encounter (Signed)
Left pt a VM stating that nausea and vomiting and headache are side effects of the wegovy and typically go away after a few weeks . I did advise the pt that if she is having light headiness and uncontrolled bladder she needs an apt with Dr Beverely Low

## 2022-08-10 NOTE — Telephone Encounter (Signed)
Caller name: Roan Sawchuk  On DPR?: Yes  Call back number: [phone] (mobile)  Provider they see: Sheliah Hatch, MD  Reason for call: Patient states that she just started her new dosage on her Wegovy due to a pharmacy shortage. Patient state that her stomach has been upset since the increase. Patient side of effects are headaches, vomiting, uncontrol bladder and gets light headed trying to stand up. Patient don't what to do about this.

## 2022-08-25 ENCOUNTER — Other Ambulatory Visit: Payer: Self-pay | Admitting: Family Medicine

## 2022-08-25 ENCOUNTER — Other Ambulatory Visit: Payer: Self-pay | Admitting: Gastroenterology

## 2022-08-25 DIAGNOSIS — K2970 Gastritis, unspecified, without bleeding: Secondary | ICD-10-CM

## 2022-10-08 ENCOUNTER — Ambulatory Visit: Payer: No Typology Code available for payment source | Admitting: Family Medicine

## 2022-10-08 ENCOUNTER — Encounter: Payer: Self-pay | Admitting: Family Medicine

## 2022-10-08 VITALS — BP 128/78 | HR 87 | Temp 98.7°F | Ht 67.0 in | Wt 243.8 lb

## 2022-10-08 DIAGNOSIS — E669 Obesity, unspecified: Secondary | ICD-10-CM | POA: Diagnosis not present

## 2022-10-08 DIAGNOSIS — F418 Other specified anxiety disorders: Secondary | ICD-10-CM

## 2022-10-08 MED ORDER — WEGOVY 1 MG/0.5ML ~~LOC~~ SOAJ
1.0000 mg | SUBCUTANEOUS | 3 refills | Status: DC
Start: 2022-10-08 — End: 2023-02-03

## 2022-10-08 NOTE — Assessment & Plan Note (Signed)
Weight is stable but pt has not been on any medication since the summer.  The higher Wegovy dose made her very sick and she stopped everything at that time.  Would like to restart Wegovy 66m weekly as this controlled her cravings and eliminated a lot of the daily food noise.  Prescription sent.  Encouraged healthy diet and regular exercise.  Will follow.

## 2022-10-08 NOTE — Assessment & Plan Note (Signed)
Improved.  Pt is now going to marriage counseling, feels that they are in a better place.  Stopped her Effexor and Wellbutrin and feels that energy level has improved and overall is feeling better.  Will hold meds at this time.  Continue to follow.

## 2022-10-08 NOTE — Progress Notes (Signed)
   Subjective:    Patient ID: Norma [name], female    DOB: 08-21-1971, 52 y.o.   MRN: [id number]  HPI Obesity- when pt increased Wegovy dose to 2.4 she developed nausea, vomiting, diarrhea.  Stopped all medication and reports energy level is better  Anxiety/depression- pt has stopped Effexor and Wellbutrin and finds that energy level has improved.  Still has some irritability.  Husband has agreed to marriage counseling.  Review of Systems For ROS see HPI     Objective:   Physical Exam Vitals reviewed.  Constitutional:      General: She is not in acute distress.    Appearance: Normal appearance. She is obese. She is not ill-appearing.  HENT:     Head: Normocephalic and atraumatic.  Eyes:     Extraocular Movements: Extraocular movements intact.     Conjunctiva/sclera: Conjunctivae normal.  Cardiovascular:     Rate and Rhythm: Normal rate.  Pulmonary:     Effort: Pulmonary effort is normal. No respiratory distress.  Skin:    General: Skin is warm and dry.  Neurological:     General: No focal deficit present.     Mental Status: She is alert and oriented to person, place, and time.  Psychiatric:        Mood and Affect: Mood normal.        Behavior: Behavior normal.        Thought Content: Thought content normal.           Assessment & Plan:

## 2022-10-08 NOTE — Patient Instructions (Signed)
Schedule your complete physical in 3 months RESTART the Wegovy at 67m weekly Continue to work on healthy diet and regular exercise- you can do it! Call with any questions or concerns Stay Safe!  Stay Healthy!

## 2022-10-12 ENCOUNTER — Telehealth: Payer: Self-pay | Admitting: Family Medicine

## 2022-10-12 NOTE — Telephone Encounter (Signed)
Caller name: Yerania Ince  On DPR?: Yes  Call back number: [phone] (mobile)  Provider they see: Midge Minium, MD  Reason for call: Calling about [phone] not at pharmacy.

## 2022-10-12 NOTE — Telephone Encounter (Signed)
Called patient and verified she needs a PA told her we would look into this as soon as possible

## 2022-10-13 NOTE — Telephone Encounter (Signed)
I have submitted  a PA thru Cover my meds for the pt Rx Wegovy . Waiting on response

## 2022-10-13 NOTE — Telephone Encounter (Signed)
No pa has been started on her Wegovy at this time I will work on this this afternoon pt is aware

## 2022-10-13 NOTE — Telephone Encounter (Signed)
I will look in Cover my meds if not there I will submit today

## 2022-10-14 NOTE — Telephone Encounter (Signed)
Pharmacy Patient Advocate Encounter  Received notification from [facility] that the request for prior authorization for [facility] has been denied due to not having good outcomes from the drug.     Please be advised we currently do not have a Pharmacist to review denials, therefore you will need to process appeals accordingly as needed. Thanks for your support at this time.  Denial letter attached to chart

## 2022-10-14 NOTE — Telephone Encounter (Signed)
How would you like to proceed given the outcome

## 2022-11-04 ENCOUNTER — Telehealth: Payer: Self-pay | Admitting: Family Medicine

## 2022-11-04 NOTE — Telephone Encounter (Signed)
Printed the denial and gave the information to [facility]

## 2022-11-04 NOTE — Telephone Encounter (Signed)
Levada Dy. S is calling from [place] stating that she need a PA for Semaglutide-Weight Management (WEGOVY) 1 MG/0.5ML SOAJ. Key code is [id number]. She couldn't give me her number to contact her. Levada Dy.S states also sent a fax as well.

## 2022-11-04 NOTE — Telephone Encounter (Signed)
Submitted via Cover My Meds and waiting on determination

## 2022-11-04 NOTE — Telephone Encounter (Signed)
This key was denied due to not having BMI included. PA was done in February for [facility] and it was denied due to pt not having adequate response to being on medication. All appeal information is documented in February encounter. E appeal is available via CMM (KEY: [id number]), original denial is indexed to patient's chart. Pharmacy Patient Advocate Encounter  Please be advised we currently do not have a Pharmacist to review denials, therefore you will need to process appeals accordingly as needed. Thanks for your support at this time.  Pharmacy Patient Advocate Encounter  Received notification from [facility] that the request for prior authorization for [facility] has been denied due to .

## 2022-11-05 ENCOUNTER — Encounter: Payer: Self-pay | Admitting: Family Medicine

## 2022-11-05 NOTE — Telephone Encounter (Signed)
Received letter from [place] approved pt Rx Wegovy from 11/05/22-06/07/23 . Pt is aware .

## 2022-11-16 ENCOUNTER — Other Ambulatory Visit (HOSPITAL_COMMUNITY): Payer: Self-pay

## 2022-12-02 ENCOUNTER — Telehealth: Payer: Self-pay

## 2022-12-02 ENCOUNTER — Other Ambulatory Visit (HOSPITAL_COMMUNITY): Payer: Self-pay

## 2022-12-02 NOTE — Telephone Encounter (Signed)
Patient Advocate Encounter  Prior Authorization for Norma [name] has been approved.     Effective dates: through 06/07/23

## 2022-12-02 NOTE — Telephone Encounter (Signed)
Pt aware of approval of medication

## 2022-12-09 ENCOUNTER — Encounter: Payer: Self-pay | Admitting: Family Medicine

## 2022-12-09 NOTE — Telephone Encounter (Signed)
Pt is noting some anxiety after starting wegovy and would like to consider adding Cymbalta pt was seen in Feb would you like another visit given start of new med

## 2022-12-14 ENCOUNTER — Encounter: Payer: Self-pay | Admitting: Family Medicine

## 2022-12-14 ENCOUNTER — Ambulatory Visit: Payer: No Typology Code available for payment source | Admitting: Family Medicine

## 2022-12-14 VITALS — BP 122/86 | HR 90 | Temp 98.2°F | Resp 16 | Ht 67.0 in | Wt 223.5 lb

## 2022-12-14 DIAGNOSIS — F418 Other specified anxiety disorders: Secondary | ICD-10-CM

## 2022-12-14 MED ORDER — VENLAFAXINE HCL ER 75 MG PO CP24: 75.0000 mg | ORAL_CAPSULE | Freq: Every day | ORAL | 3 refills | Status: DC

## 2022-12-14 NOTE — Progress Notes (Signed)
   Subjective:    Patient ID: Norma [name], female    DOB: June 21, 1971, 52 y.o.   MRN: [id number]  HPI Anxiety- pt reports she was feeling 'really good' but since restarting the [facility] she has noticed increased irritability and a short fuse.  Was previously on medication but was able to wean off and was doing well.  Denies sadness or feeling overwhelmed, just feels 'edgy'.     Review of Systems For ROS see HPI     Objective:   Physical Exam Vitals reviewed.  Constitutional:      General: She is not in acute distress.    Appearance: Normal appearance. She is not ill-appearing.  HENT:     Head: Normocephalic and atraumatic.  Eyes:     Extraocular Movements: Extraocular movements intact.     Conjunctiva/sclera: Conjunctivae normal.     Pupils: Pupils are equal, round, and reactive to light.  Cardiovascular:     Rate and Rhythm: Normal rate.  Pulmonary:     Effort: Pulmonary effort is normal. No respiratory distress.  Skin:    General: Skin is warm and dry.  Neurological:     General: No focal deficit present.     Mental Status: She is alert and oriented to person, place, and time.  Psychiatric:        Mood and Affect: Mood normal.        Behavior: Behavior normal.        Thought Content: Thought content normal.           Assessment & Plan:

## 2022-12-14 NOTE — Patient Instructions (Addendum)
Follow up via MyChart in 4-6 weeks and let me know how the anxiety is going START the Venlafaxine daily- take w/ food Keep up the good work!!!  You're down 20 lbs!!! Call with any questions or concerns Hang in there!!

## 2022-12-14 NOTE — Assessment & Plan Note (Signed)
Pt reports depression is well controlled but anxiety has worsened.  She reports talking to someone else on Wegovy who had similar issues w/ anxiety after starting the medication.  Will restart Venlafaxine  daily and titrate back up to  daily if needed.  Will follow.

## 2023-01-05 ENCOUNTER — Other Ambulatory Visit: Payer: Self-pay | Admitting: Family Medicine

## 2023-01-06 ENCOUNTER — Other Ambulatory Visit: Payer: Self-pay

## 2023-01-06 ENCOUNTER — Encounter: Payer: Self-pay | Admitting: Family Medicine

## 2023-01-06 ENCOUNTER — Telehealth: Payer: Self-pay

## 2023-01-06 ENCOUNTER — Ambulatory Visit (INDEPENDENT_AMBULATORY_CARE_PROVIDER_SITE_OTHER): Payer: No Typology Code available for payment source | Admitting: Family Medicine

## 2023-01-06 VITALS — BP 124/82 | HR 74 | Temp 98.2°F | Resp 18 | Ht 67.0 in | Wt 220.2 lb

## 2023-01-06 DIAGNOSIS — Z8249 Family history of ischemic heart disease and other diseases of the circulatory system: Secondary | ICD-10-CM

## 2023-01-06 DIAGNOSIS — E669 Obesity, unspecified: Secondary | ICD-10-CM

## 2023-01-06 DIAGNOSIS — Z0000 Encounter for general adult medical examination without abnormal findings: Secondary | ICD-10-CM

## 2023-01-06 DIAGNOSIS — E559 Vitamin D deficiency, unspecified: Secondary | ICD-10-CM

## 2023-01-06 LAB — HEPATIC FUNCTION PANEL
ALT: 15 U/L (ref 0–35)
AST: 15 U/L (ref 0–37)
Albumin: 4.2 g/dL (ref 3.5–5.2)
Alkaline Phosphatase: 66 U/L (ref 39–117)
Bilirubin, Direct: 0.1 mg/dL (ref 0.0–0.3)
Total Bilirubin: 0.4 mg/dL (ref 0.2–1.2)
Total Protein: 6.8 g/dL (ref 6.0–8.3)

## 2023-01-06 LAB — BASIC METABOLIC PANEL
BUN: 12 mg/dL (ref 6–23)
CO2: 30 mEq/L (ref 19–32)
Calcium: 9 mg/dL (ref 8.4–10.5)
Chloride: 102 mEq/L (ref 96–112)
Creatinine, Ser: 0.71 mg/dL (ref 0.40–1.20)
GFR: 99.61 mL/min (ref >60.00)
Glucose, Bld: 86 mg/dL (ref 70–99)
Potassium: 4.4 mEq/L (ref 3.5–5.1)
Sodium: 140 mEq/L (ref 135–145)

## 2023-01-06 LAB — CBC WITH DIFFERENTIAL/PLATELET
Basophils Absolute: 0 10*3/uL (ref 0.0–0.1)
Basophils Relative: 0.6 % (ref 0.0–3.0)
Eosinophils Absolute: 0.2 10*3/uL (ref 0.0–0.7)
Eosinophils Relative: 2.5 % (ref 0.0–5.0)
HCT: 38.8 % (ref 36.0–46.0)
Hemoglobin: 13.1 g/dL (ref 12.0–15.0)
Lymphocytes Relative: 23.5 % (ref 12.0–46.0)
Lymphs Abs: 1.5 10*3/uL (ref 0.7–4.0)
MCHC: 33.7 g/dL (ref 30.0–36.0)
MCV: 82.7 fl (ref 78.0–100.0)
Monocytes Absolute: 0.3 10*3/uL (ref 0.1–1.0)
Monocytes Relative: 5 % (ref 3.0–12.0)
Neutro Abs: 4.3 10*3/uL (ref 1.4–7.7)
Neutrophils Relative %: 68.4 % (ref 43.0–77.0)
Platelets: 186 10*3/uL (ref 150.0–400.0)
RBC: 4.69 Mil/uL (ref 3.87–5.11)
RDW: 14.4 % (ref 11.5–15.5)
WBC: 6.4 10*3/uL (ref 4.0–10.5)

## 2023-01-06 LAB — LIPID PANEL
Cholesterol: 131 mg/dL (ref 0–200)
HDL: 47.6 mg/dL (ref >39.00)
LDL Cholesterol: 69 mg/dL (ref 0–99)
NonHDL: 83.36
Total CHOL/HDL Ratio: 3
Triglycerides: 72 mg/dL (ref 0.0–149.0)
VLDL: 14.4 mg/dL (ref 0.0–40.0)

## 2023-01-06 LAB — TSH: TSH: 0.62 u[IU]/mL (ref 0.35–5.50)

## 2023-01-06 LAB — VITAMIN D 25 HYDROXY (VIT D DEFICIENCY, FRACTURES): VITD: 22.89 ng/mL — ABNORMAL LOW (ref 30.00–100.00)

## 2023-01-06 MED ORDER — VITAMIN D (ERGOCALCIFEROL) 1.25 MG (50000 UNIT) PO CAPS: 50000.0000 [IU] | ORAL_CAPSULE | ORAL | 12 refills | Status: AC

## 2023-01-06 MED ORDER — AZELASTINE HCL 0.1 % NA SOLN: 1.0000 | Freq: Two times a day (BID) | NASAL | 5 refills | Status: DC

## 2023-01-06 MED ORDER — VENLAFAXINE HCL ER 37.5 MG PO CP24: 37.5000 mg | ORAL_CAPSULE | Freq: Every day | ORAL | 3 refills | Status: DC

## 2023-01-06 NOTE — Assessment & Plan Note (Signed)
Refer for ECHO to assess

## 2023-01-06 NOTE — Telephone Encounter (Signed)
Left lab results on pt VM . Vit D 50,000 units has been sent in

## 2023-01-06 NOTE — Assessment & Plan Note (Signed)
Pt's PE WNL w/ exception of BMI.  UTD on pap, mammo, colonoscopy, immunizations.  Check labs.  Anticipatory guidance provided.  

## 2023-01-06 NOTE — Patient Instructions (Signed)
Follow up in 6 months to recheck weight loss progress We'll notify you of your lab results and make any changes if needed DECREASE the Venlafaxine to 37.5mg  daily x2 weeks and you can then stop if desired Keep up the good work on healthy diet and regular exercise- you're doing great!! We'll call you to schedule your ECHO appt Call with any questions or concerns Stay Safe!  Stay Healthy! Happy Mother's Day!!!

## 2023-01-06 NOTE — Telephone Encounter (Signed)
-----   Message from Sheliah Hatch, MD sent at 01/06/2023  3:15 PM EDT ----- Labs look great w/ exception of low Vit D.  Based on this, we need to start 50,000 units weekly x12 weeks in addition to daily OTC supplement of at least 2000 units.

## 2023-01-06 NOTE — Progress Notes (Signed)
   Subjective:    Patient ID: Norma [name], female    DOB: May 02, 1971, 52 y.o.   MRN: [id number]  HPI CPE- UTD on colonoscopy, mammo, pap, Tdap  Patient Care Team    Relationship Specialty Notifications Start End  Sheliah Hatch, MD PCP - General Family Medicine  12/07/12   Jake Bathe, MD PCP - Cardiology Cardiology  11/07/18   Dara Lords, MD (Inactive) Consulting Physician Gynecology  03/09/19     Health Maintenance  Topic Date Due   Hepatitis C Screening  10/09/2023 (Originally 04/07/1991)   HIV Screening  10/09/2023 (Originally 04/06/1988)   INFLUENZA VACCINE  03/31/2023   DTaP/Tdap/Td (3 - Td or Tdap) 09/13/2028   COLONOSCOPY (Pts 45-35yrs Insurance coverage will need to be confirmed)  07/10/2029   HPV VACCINES  Aged Out   MAMMOGRAM  Discontinued   PAP SMEAR-Modifier  Discontinued   COVID-19 Vaccine  Discontinued      Review of Systems Patient reports no vision changes, adenopathy,fever, weight change,  persistant/recurrent hoarseness , swallowing issues, chest pain, palpitations, edema, persistant/recurrent cough, hemoptysis, dyspnea (rest/exertional/paroxysmal nocturnal), gastrointestinal bleeding (melena, rectal bleeding), abdominal pain, significant heartburn, bowel changes, GU symptoms (dysuria, hematuria, incontinence), Gyn symptoms (abnormal  bleeding, pain),  syncope, focal weakness, memory loss, numbness & tingling, skin/hair/nail changes, abnormal bruising or bleeding, anxiety, or depression.   + muffled hearing on R, feels like water in the ear    Objective:   Physical Exam General Appearance:    Alert, cooperative, no distress, appears stated age  Head:    Normocephalic, without obvious abnormality, atraumatic  Eyes:    PERRL, conjunctiva/corneas clear, EOM's intact both eyes  Ears:    Normal external ear canals both ears, TM's retracted bilaterally  Nose:   Nares normal, septum midline, mucosa normal, no drainage    or sinus tenderness   Throat:   Lips, mucosa, and tongue normal; teeth and gums normal  Neck:   Supple, symmetrical, trachea midline, no adenopathy;    Thyroid: no enlargement/tenderness/nodules  Back:     Symmetric, no curvature, ROM normal, no CVA tenderness  Lungs:     Clear to auscultation bilaterally, respirations unlabored  Chest Wall:    No tenderness or deformity   Heart:    Regular rate and rhythm, S1 and S2 normal, no murmur, rub   or gallop  Breast Exam:    Deferred to GYN  Abdomen:     Soft, non-tender, bowel sounds active all four quadrants,    no masses, no organomegaly  Genitalia:    Deferred to GYN  Rectal:    Extremities:   Extremities normal, atraumatic, no cyanosis or edema  Pulses:   2+ and symmetric all extremities  Skin:   Skin color, texture, turgor normal, no rashes or lesions  Lymph nodes:   Cervical, supraclavicular, and axillary nodes normal  Neurologic:   CNII-XII intact, normal strength, sensation and reflexes    throughout          Assessment & Plan:

## 2023-01-06 NOTE — Assessment & Plan Note (Signed)
Ongoing issue for pt.  Doing well on Wegovy.  Check labs to risk stratify but no anticipated med changes at this time

## 2023-01-11 ENCOUNTER — Telehealth (HOSPITAL_BASED_OUTPATIENT_CLINIC_OR_DEPARTMENT_OTHER): Payer: Self-pay | Admitting: *Deleted

## 2023-01-11 NOTE — Telephone Encounter (Signed)
Left message for patient to call and discuss scheduling the Echocardiogram ordered by Dr. Beverely Low

## 2023-01-28 ENCOUNTER — Other Ambulatory Visit: Payer: Self-pay | Admitting: Family Medicine

## 2023-01-30 ENCOUNTER — Other Ambulatory Visit: Payer: Self-pay | Admitting: Family Medicine

## 2023-02-03 ENCOUNTER — Ambulatory Visit (HOSPITAL_COMMUNITY)
Admission: RE | Admit: 2023-02-03 | Discharge: 2023-02-03 | Disposition: A | Discharge status: Home / Self Care | Payer: No Typology Code available for payment source | Source: Ambulatory Visit | Attending: Family Medicine | Admitting: Family Medicine

## 2023-02-03 ENCOUNTER — Other Ambulatory Visit: Payer: Self-pay

## 2023-02-03 DIAGNOSIS — Z8249 Family history of ischemic heart disease and other diseases of the circulatory system: Secondary | ICD-10-CM | POA: Diagnosis not present

## 2023-02-03 DIAGNOSIS — G4730 Sleep apnea, unspecified: Secondary | ICD-10-CM | POA: Insufficient documentation

## 2023-02-03 DIAGNOSIS — I361 Nonrheumatic tricuspid (valve) insufficiency: Secondary | ICD-10-CM | POA: Diagnosis not present

## 2023-02-03 LAB — ECHOCARDIOGRAM COMPLETE
Area-P 1/2: 3.77 cm2
Calc EF: 54.3 %
S' Lateral: 3.1 cm
Single Plane A2C EF: 55.2 %
Single Plane A4C EF: 57.1 %

## 2023-02-03 MED ORDER — WEGOVY 1 MG/0.5ML ~~LOC~~ SOAJ: 1.0000 mg | SUBCUTANEOUS | 3 refills | Status: DC

## 2023-02-03 NOTE — Progress Notes (Signed)
Echocardiogram 2D Echocardiogram has been performed.  Vaun Hyndman L Cristal Howatt 02/03/2023, 9:40 AM 

## 2023-02-03 NOTE — Telephone Encounter (Signed)
Encourage patient to contact the pharmacy for refills or they can request refills through [facility]  (Please schedule appointment if patient has not been seen in over a year)    WHAT PHARMACY WOULD THEY LIKE [facility] SENT TO[facility]  MEDICATION NAME & DOSE: XBJYNW 2.4mg   NOTES/COMMENTS FROM PATIENT:      Front office please notify patient: It takes 48-72 hours to process rx refill requests Ask patient to call pharmacy to ensure rx is ready before heading there.

## 2023-02-03 NOTE — Telephone Encounter (Signed)
Pt has been informed.

## 2023-02-03 NOTE — Progress Notes (Signed)
Pt has been notified.

## 2023-02-03 NOTE — Telephone Encounter (Signed)
Patient is requesting a refill of the following medications: Requested Prescriptions   Pending Prescriptions Disp Refills   Semaglutide-Weight Management (WEGOVY) 1 MG/0.5ML SOAJ 2 mL 3    Sig: Inject 1 mg into the skin once a week.    Date of patient request: 02/03/23 Last office visit: 01/06/23 Date of last refill: 10/08/22 Last refill amount: 2mL

## 2023-02-06 ENCOUNTER — Other Ambulatory Visit: Payer: Self-pay | Admitting: Family Medicine

## 2023-02-06 DIAGNOSIS — F418 Other specified anxiety disorders: Secondary | ICD-10-CM

## 2023-02-07 NOTE — Telephone Encounter (Signed)
Clonazepam 1 mg LOV: 01/06/23 Last Refill:08/03/23 Upcoming appt: 07/12/23

## 2023-02-14 ENCOUNTER — Other Ambulatory Visit: Payer: Self-pay | Admitting: Family Medicine

## 2023-02-18 ENCOUNTER — Telehealth: Payer: Self-pay | Admitting: Family Medicine

## 2023-02-18 ENCOUNTER — Other Ambulatory Visit: Payer: Self-pay

## 2023-02-18 MED ORDER — WEGOVY 2.4 MG/0.75ML ~~LOC~~ SOAJ: 2.4000 mg | SUBCUTANEOUS | 1 refills | Status: DC

## 2023-02-18 MED ORDER — WEGOVY 2.4 MG/0.75ML ~~LOC~~ SOAJ: 2.4000 mg | SUBCUTANEOUS | 3 refills | Status: DC

## 2023-02-18 NOTE — Telephone Encounter (Signed)
Caller name: Darcy Barbara  On DPR?: Yes  Call back number: [phone] (mobile)  Provider they see: Sheliah Hatch, MD  Reason for call:   Pt states she's calls several times about [facility] wrong dose. Told Pharmacy to cancel that prescription because wrong dose. Should be 2.4mg   Semaglutide-Weight Management (WEGOVY) 1 MG/0.5ML SOAJ  [id number] [place], [place] - [address]

## 2023-02-18 NOTE — Telephone Encounter (Signed)
Prescription for 2.4mg  Reginal Lutes was sent to pharmacy

## 2023-02-18 NOTE — Telephone Encounter (Signed)
Pt is aware.  

## 2023-02-18 NOTE — Telephone Encounter (Signed)
Pt states she is taking the 2. 4 mg . She states she could not get the 1 mg due to insurance and then they approved it in April . She states she is taking the 2. 4 mg and has not taken the 1 mg she is out . Can you refill ? This is for [facility] there is not phone notes from where she has called the office on Monday 02/14/23 or last week needing a refill nor anything from pharmacy

## 2023-06-30 ENCOUNTER — Other Ambulatory Visit (HOSPITAL_COMMUNITY): Payer: Self-pay

## 2023-07-04 ENCOUNTER — Other Ambulatory Visit (HOSPITAL_COMMUNITY): Payer: Self-pay

## 2023-07-04 ENCOUNTER — Telehealth: Payer: Self-pay | Admitting: Pharmacist

## 2023-07-04 ENCOUNTER — Other Ambulatory Visit: Payer: Self-pay | Admitting: Family Medicine

## 2023-07-04 DIAGNOSIS — F418 Other specified anxiety disorders: Secondary | ICD-10-CM

## 2023-07-04 NOTE — Telephone Encounter (Signed)
Pt was 243 lbs on 10/08/22 when we first prescribed her Wegovy earlier this year.  She was 220 lbs on 01/06/23- losing 10% of her body weight in 3 months.  She is using this medication in combination w/ a reduced calorie diet and regular exercise.  She should meet criteria to continue her medication.

## 2023-07-04 NOTE — Telephone Encounter (Signed)
Prior Authorization form/request via patient's pharmacy asks a question that requires your assistance. Please see the question below and advise accordingly.   The prior authorization request for [facility] 2.4mg  requires updated information, as the patient has not been seen in the office since 01/06/2023. To proceed, please provide the following:  Will the requested drug be used with a reduced calorie diet and increased physical activity to reduce excess body weight or maintain weight reduction long term?*  Has the patient lost at least 5 percent of baseline body weight OR has the patient continued to maintain their initial 5 percent weight loss? ACTION REQUIRED: If yes, then documentation is required for approval. Document the patient's weight prior to starting drug therapy for weight loss and the patient's current weight, including the dates the weights were taken:*   Dellie Burns, PharmD Clinical Pharmacist [place]  Direct Dial: [phone]

## 2023-07-04 NOTE — Telephone Encounter (Signed)
Last office visit 01/06/2023 Last refill 02/07/2023 30 day 3 refills

## 2023-07-05 ENCOUNTER — Other Ambulatory Visit (HOSPITAL_COMMUNITY): Payer: Self-pay

## 2023-07-05 NOTE — Telephone Encounter (Signed)
Additional information has been requested from the patient's insurance in order to proceed with the prior authorization request. Requested information has been sent, or form has been filled out and faxed back to [employer] via CMM.

## 2023-07-05 NOTE — Telephone Encounter (Signed)
Pt has been informed.

## 2023-07-05 NOTE — Telephone Encounter (Signed)
Pharmacy Patient Advocate Encounter  Received notification from [facility] that Prior Authorization for [facility] 2.4MG /0.75ML auto-injectors has been APPROVED from 07/04/2023 to 00/12/2023   PA #/Case ID/Reference #: [id number]

## 2023-07-11 ENCOUNTER — Encounter: Payer: Self-pay | Admitting: Family Medicine

## 2023-07-11 ENCOUNTER — Ambulatory Visit: Payer: No Typology Code available for payment source | Admitting: Family Medicine

## 2023-07-11 VITALS — BP 108/84 | HR 64 | Temp 98.0°F | Ht 67.0 in | Wt 192.8 lb

## 2023-07-11 DIAGNOSIS — E669 Obesity, unspecified: Secondary | ICD-10-CM

## 2023-07-11 DIAGNOSIS — F418 Other specified anxiety disorders: Secondary | ICD-10-CM

## 2023-07-11 NOTE — Assessment & Plan Note (Signed)
Pt is down another 28 lbs since May.  Is tolerating Wegovy w/o difficulty.  Has increased energy.  Is watching what she is eating and walking.  Applauded her efforts.  Will continue to follow.

## 2023-07-11 NOTE — Patient Instructions (Signed)
Schedule your complete physical in 6 months Keep up the good work on healthy diet and regular exercise- you look great! Call with any questions or concerns Stay Safe!  Stay Healthy! Happy Thanksgiving!!

## 2023-07-11 NOTE — Assessment & Plan Note (Signed)
Pt reports feeling well overall since stopping Venlafaxine.  Still has high levels of stress but is finding other ways to manage.  No need for meds at this time.  Will follow.

## 2023-07-11 NOTE — Progress Notes (Signed)
   Subjective:    Patient ID: Norma [name], female    DOB: [birth year], 52 y.o.   MRN: [id number]  HPI Obesity- pt is down 28 lbs since May.  Currently on Wegovy.  Pt reports having 'so much more energy'.  'it's incredible'.  Has to be careful what she eats due to GI upset.  Has no interest in alcohol.  Walking regularly.  Depression/Anxiety- pt reports feeling pretty good overall.  Has stopped the Venlafaxine and is feeling well.  Still has a lot of stress but is managing in other ways- including walking.    Review of Systems For ROS see HPI     Objective:   Physical Exam Vitals reviewed.  Constitutional:      General: She is not in acute distress.    Appearance: Normal appearance. She is not ill-appearing.  HENT:     Head: Normocephalic and atraumatic.  Eyes:     Extraocular Movements: Extraocular movements intact.     Conjunctiva/sclera: Conjunctivae normal.  Cardiovascular:     Rate and Rhythm: Normal rate and regular rhythm.  Pulmonary:     Effort: Pulmonary effort is normal. No respiratory distress.  Skin:    General: Skin is warm and dry.  Neurological:     General: No focal deficit present.     Mental Status: She is alert and oriented to person, place, and time.  Psychiatric:        Mood and Affect: Mood normal.        Behavior: Behavior normal.        Thought Content: Thought content normal.           Assessment & Plan:

## 2023-07-12 ENCOUNTER — Ambulatory Visit: Payer: No Typology Code available for payment source | Admitting: Family Medicine

## 2023-10-04 ENCOUNTER — Other Ambulatory Visit (HOSPITAL_COMMUNITY): Payer: Self-pay

## 2023-10-04 ENCOUNTER — Telehealth: Payer: Self-pay

## 2023-10-04 NOTE — Telephone Encounter (Signed)
 Pt has been notified.

## 2023-10-04 NOTE — Telephone Encounter (Signed)
 Pharmacy Patient Advocate Encounter  Insurance verification completed.   The patient is insured through [facility]   Ran test claim for Wegovy  2.4 mg/0.39ml auto injectors. The prior auth is good until 07/04/24 however the insurance company is requiring they get it through [employer]. See below for additional information. This test claim was processed through [facility]- copay amounts may vary at other pharmacies due to pharmacy/plan contracts, or as the patient moves through the different stages of their insurance plan.

## 2023-10-07 ENCOUNTER — Other Ambulatory Visit (HOSPITAL_COMMUNITY): Payer: Self-pay

## 2023-12-01 ENCOUNTER — Other Ambulatory Visit: Payer: Self-pay | Admitting: Family Medicine

## 2023-12-01 ENCOUNTER — Other Ambulatory Visit (HOSPITAL_COMMUNITY): Payer: Self-pay

## 2023-12-02 ENCOUNTER — Other Ambulatory Visit: Payer: Self-pay | Admitting: Family Medicine

## 2023-12-02 DIAGNOSIS — F418 Other specified anxiety disorders: Secondary | ICD-10-CM

## 2023-12-02 NOTE — Telephone Encounter (Signed)
 Requested Prescriptions   Pending Prescriptions Disp Refills   clonazePAM (KLONOPIN) 1 MG tablet [Pharmacy Med Name: CLONAZEPAM 1 MG TABLET] 30 tablet     Sig: TAKE 1 TABLET BY MOUTH AT BEDTIME AS NEEDED FOR ANXIETY     Date of patient request: [id number] Last office visit: 07/11/2023 Upcoming visit: Visit date not found Date of last refill: 07/04/23 Last refill amount: 30

## 2024-01-27 ENCOUNTER — Other Ambulatory Visit: Payer: Self-pay | Admitting: Family Medicine

## 2024-03-23 ENCOUNTER — Other Ambulatory Visit: Payer: Self-pay | Admitting: Family Medicine

## 2024-03-29 ENCOUNTER — Other Ambulatory Visit: Payer: Self-pay | Admitting: Family Medicine

## 2024-04-10 ENCOUNTER — Other Ambulatory Visit: Payer: Self-pay | Admitting: Family Medicine

## 2024-04-10 DIAGNOSIS — F418 Other specified anxiety disorders: Secondary | ICD-10-CM

## 2024-04-10 NOTE — Telephone Encounter (Signed)
 Requested Prescriptions   Pending Prescriptions Disp Refills   clonazePAM  (KLONOPIN ) 1 MG tablet [Pharmacy Med Name: CLONAZEPAM  1 MG TABLET] 30 tablet     Sig: TAKE 1 TABLET BY MOUTH AT BEDTIME AS NEEDED FOR ANXIETY     Date of patient request: 04/10/2024 Last office visit: 07/11/2023 Upcoming visit: Visit date not found Date of last refill: 12/02/2023 Last refill amount: 30- 3 refills

## 2024-04-17 ENCOUNTER — Telehealth: Payer: Self-pay

## 2024-04-17 ENCOUNTER — Other Ambulatory Visit (HOSPITAL_COMMUNITY): Payer: Self-pay

## 2024-04-17 NOTE — Telephone Encounter (Signed)
 Pharmacy Patient Advocate Encounter   Received notification from Onbase that prior authorization for Wegovy  2.4 is required/requested.   Insurance verification completed.   The patient is insured through [facility] ADVANTAGE/RX ADVANCE .   Per test claim: patient has current PA that expires 07/04/24 Patient must enroll in [facility] [phone]

## 2024-04-17 NOTE — Telephone Encounter (Signed)
**Note De-identified  [place] Obfuscation** Please advise 

## 2024-04-17 NOTE — Telephone Encounter (Signed)
 I'm not sure what this message is saying- on the one hand it says she has a valid PA through November but then says she needs to enroll in a different program.  Including the Prior Auth team on this message

## 2024-04-23 ENCOUNTER — Other Ambulatory Visit: Payer: Self-pay

## 2024-04-23 MED ORDER — WEGOVY 2.4 MG/0.75ML ~~LOC~~ SOAJ: 2.4000 mg | SUBCUTANEOUS | 1 refills | Status: DC

## 2024-04-23 NOTE — Telephone Encounter (Signed)
 Pt has been notified and rx has been sent in

## 2024-04-23 NOTE — Telephone Encounter (Signed)
 Pt reports she is enrolled .

## 2024-07-16 ENCOUNTER — Other Ambulatory Visit: Payer: Self-pay | Admitting: Family Medicine

## 2024-07-20 ENCOUNTER — Other Ambulatory Visit: Payer: Self-pay | Admitting: Family Medicine

## 2024-07-20 DIAGNOSIS — F418 Other specified anxiety disorders: Secondary | ICD-10-CM

## 2024-07-20 MED ORDER — CLONAZEPAM 1 MG PO TABS
ORAL_TABLET | ORAL | 3 refills | Status: AC
Start: 2024-07-20 — End: ?

## 2024-07-20 NOTE — Telephone Encounter (Signed)
 Requesting: clonazepam  1mg   Contract: None UDS: None Last Visit: 07/11/23 Next Visit:10/11/24 Last Refill: 04/10/24 #30 and 3rf   Please Advise

## 2024-07-20 NOTE — Telephone Encounter (Signed)
 Copied from CRM #[id number]. Topic: Clinical - Medication Refill >> Jul 20, 2024  9:44 AM Viola F wrote: Medication: semaglutide -weight management (WEGOVY ) 2.4 MG/0.75ML SOAJ SQ injection [[id number]] clonazePAM  (KLONOPIN ) 1 MG tablet [[id number]]   Has the patient contacted their pharmacy? Yes (Agent: If no, request that the patient contact the pharmacy for the refill. If patient does not wish to contact the pharmacy document the reason why and proceed with request.) (Agent: If yes, when and what did the pharmacy advise?)  This is the patient's preferred pharmacy:   [id number] [place] [place] - [address] AT [facility] [address] [place] [address] Phone: [phone] Fax: [phone]  Is this the correct pharmacy for this prescription? Yes If no, delete pharmacy and type the correct one.   Has the prescription been filled recently? Yes  Is the patient out of the medication? Yes, Tuesday   Has the patient been seen for an appointment in the last year OR does the patient have an upcoming appointment? Yes  Can we respond through MyChart? Yes  Agent: Please be advised that Rx refills may take up to 3 business days. We ask that you follow-up with your pharmacy.

## 2024-07-24 ENCOUNTER — Telehealth: Payer: Self-pay

## 2024-07-24 ENCOUNTER — Other Ambulatory Visit (HOSPITAL_COMMUNITY): Payer: Self-pay

## 2024-07-24 MED ORDER — WEGOVY 2.4 MG/0.75ML ~~LOC~~ SOAJ: 2.4000 mg | SUBCUTANEOUS | 1 refills | Status: DC

## 2024-07-24 NOTE — Telephone Encounter (Signed)
 Please disregard message about PA being needed. I see message about PA being done before

## 2024-07-24 NOTE — Telephone Encounter (Signed)
 Please disregard message about PA   Copied from CRM [id number]. Topic: Clinical - Medication Question >> Jul 24, 2024  9:23 AM Norma [name] wrote: Reason for CRM: Patient would like to know if prescription Wegovy  can be sent to [facility] instead due to no PA being needed to be sent in and disregard the one with [facility]. Please call with update.

## 2024-07-24 NOTE — Telephone Encounter (Signed)
 Pt has been notified  Pt reports [facility] discount is $50.00 less.

## 2024-07-24 NOTE — Telephone Encounter (Signed)
 Pharmacy Patient Advocate Encounter  Received notification from [facility] that Prior Authorization for  Wegovy  2.4MG /0.75ML  has been APPROVED from 07/24/24 to 07/24/25   PA #/Case ID/Reference #: [id number]

## 2024-07-24 NOTE — Telephone Encounter (Signed)
 Pharmacy Patient Advocate Encounter   Received notification from Onbase that prior authorization for Wegovy  2.4MG /0.75ML is required/requested.   Insurance verification completed.   The patient is insured through [facility].   Per test claim: PA required; PA submitted to above mentioned insurance via Latent Key/confirmation #/[facility] Status is pending

## 2024-07-24 NOTE — Telephone Encounter (Signed)
 Pt wanted to inform Dr.Tabori of the discount to inform other pts

## 2024-07-24 NOTE — Telephone Encounter (Unsigned)
 Copied from CRM #[id number]. Topic: Clinical - Medication Prior Auth >> Jul 24, 2024  8:14 AM Suzen RAMAN wrote: Reason for CRM: per pharmacy patient needs a prior authorization submitted for semaglutide -weight management (WEGOVY ) 2.4 MG/0.75ML SOAJ SQ injection. Patient not understanding why this is needed if she is paying $400 dollars out of pocket for medication and per patient the insurance is not paying for the medication.  Please contact patient back with an update on this  CB# [phone] (M)

## 2024-07-24 NOTE — Addendum Note (Signed)
 Addended by: Gianni Fuchs A on: 07/24/2024 09:37 AM   Modules accepted: Orders

## 2024-07-24 NOTE — Telephone Encounter (Signed)
PA is needed for wegovy

## 2024-09-13 ENCOUNTER — Other Ambulatory Visit: Payer: Self-pay | Admitting: Family Medicine

## 2024-09-13 NOTE — Telephone Encounter (Signed)
 Requested Prescriptions   Pending Prescriptions Disp Refills   WEGOVY  2.4 MG/0.75ML SOAJ SQ injection [Pharmacy Med Name: Wegovy  Subcutaneous Solution Auto-injector 2.4 MG/0.75ML] 3 mL 0    Sig: Inject 2.4 mg into the skin once a week.     Date of patient request: 09/13/2024 Last office visit: 07/11/2023 Upcoming visit: 10/11/2024 Date of last refill: 07/24/2024 Last refill amount: 3ml  Follow up scheduled 10/11/2024

## 2024-10-11 ENCOUNTER — Encounter: Admitting: Family Medicine
